# Patient Record
Sex: Male | Born: 1955 | Race: White | Hispanic: No | Smoking: Former smoker
Health system: Southern US, Community
[De-identification: ages and names within clinical notes are randomized; demographics above are authoritative.]

## PROBLEM LIST (undated history)

## (undated) DIAGNOSIS — B1920 Unspecified viral hepatitis C without hepatic coma: Secondary | ICD-10-CM

## (undated) DIAGNOSIS — L0390 Cellulitis, unspecified: Secondary | ICD-10-CM

## (undated) DIAGNOSIS — M5137 Other intervertebral disc degeneration, lumbosacral region: Secondary | ICD-10-CM

## (undated) DIAGNOSIS — M549 Dorsalgia, unspecified: Secondary | ICD-10-CM

## (undated) DIAGNOSIS — M51379 Other intervertebral disc degeneration, lumbosacral region without mention of lumbar back pain or lower extremity pain: Secondary | ICD-10-CM

## (undated) HISTORY — DX: Unspecified viral hepatitis C without hepatic coma: B19.20

## (undated) HISTORY — DX: Cellulitis, unspecified: L03.90

## (undated) HISTORY — DX: Dorsalgia, unspecified: M54.9

---

## 2011-07-03 ENCOUNTER — Encounter (HOSPITAL_COMMUNITY): Payer: Self-pay

## 2011-07-03 ENCOUNTER — Emergency Department (HOSPITAL_COMMUNITY)
Admission: EM | Admit: 2011-07-03 | Discharge: 2011-07-04 | Disposition: A | Discharge status: Home / Self Care | Payer: Self-pay | Attending: Emergency Medicine | Admitting: Emergency Medicine

## 2011-07-03 DIAGNOSIS — S0292XA Unspecified fracture of facial bones, initial encounter for closed fracture: Secondary | ICD-10-CM

## 2011-07-03 DIAGNOSIS — S0280XA Fracture of other specified skull and facial bones, unspecified side, initial encounter for closed fracture: Secondary | ICD-10-CM | POA: Insufficient documentation

## 2011-07-03 DIAGNOSIS — IMO0002 Reserved for concepts with insufficient information to code with codable children: Secondary | ICD-10-CM

## 2011-07-03 DIAGNOSIS — S02400A Malar fracture unspecified, initial encounter for closed fracture: Secondary | ICD-10-CM | POA: Insufficient documentation

## 2011-07-03 DIAGNOSIS — S02401A Maxillary fracture, unspecified, initial encounter for closed fracture: Secondary | ICD-10-CM | POA: Insufficient documentation

## 2011-07-03 DIAGNOSIS — F17200 Nicotine dependence, unspecified, uncomplicated: Secondary | ICD-10-CM | POA: Insufficient documentation

## 2011-07-03 DIAGNOSIS — S0100XA Unspecified open wound of scalp, initial encounter: Secondary | ICD-10-CM | POA: Insufficient documentation

## 2011-07-03 MED ORDER — OXYCODONE-ACETAMINOPHEN 5-325 MG PO TABS
2.0000 | ORAL_TABLET | Freq: Once | ORAL | Status: AC
  Administered 2011-07-03: 2 via ORAL
  Filled 2011-07-03: qty 2

## 2011-07-03 MED ORDER — TETANUS-DIPHTH-ACELL PERTUSSIS 5-2.5-18.5 LF-MCG/0.5 IM SUSP
0.5000 mL | Freq: Once | INTRAMUSCULAR | Status: AC
  Administered 2011-07-03: 0.5 mL via INTRAMUSCULAR
  Filled 2011-07-03: qty 0.5
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
## 2011-07-04 ENCOUNTER — Emergency Department (HOSPITAL_COMMUNITY): Payer: Self-pay

## 2011-07-04 ENCOUNTER — Encounter (HOSPITAL_COMMUNITY): Payer: Self-pay | Admitting: Radiology

## 2011-07-04 MED ORDER — IBUPROFEN 800 MG PO TABS: 800.0000 mg | ORAL_TABLET | Freq: Three times a day (TID) | ORAL | Status: AC

## 2011-07-04 MED ORDER — DOXYCYCLINE HYCLATE 100 MG PO CAPS: 100.0000 mg | ORAL_CAPSULE | Freq: Two times a day (BID) | ORAL | Status: AC

## 2011-07-04 MED ORDER — OXYCODONE-ACETAMINOPHEN 5-325 MG PO TABS: 2.0000 | ORAL_TABLET | ORAL | Status: AC | PRN
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
## 2011-07-04 NOTE — ED Notes (Signed)
Patient stating now he may just walk out--"don't want the tests'

## 2011-07-08 ENCOUNTER — Encounter (HOSPITAL_COMMUNITY): Payer: Self-pay | Admitting: Emergency Medicine

## 2016-01-07 ENCOUNTER — Encounter (HOSPITAL_COMMUNITY): Payer: Self-pay | Admitting: *Deleted

## 2016-01-07 ENCOUNTER — Emergency Department (HOSPITAL_COMMUNITY)
Admission: EM | Admit: 2016-01-07 | Discharge: 2016-01-07 | Disposition: A | Discharge status: Home / Self Care | Payer: Self-pay | Attending: Emergency Medicine | Admitting: Emergency Medicine

## 2016-01-07 DIAGNOSIS — Y939 Activity, unspecified: Secondary | ICD-10-CM | POA: Insufficient documentation

## 2016-01-07 DIAGNOSIS — S81812A Laceration without foreign body, left lower leg, initial encounter: Secondary | ICD-10-CM | POA: Insufficient documentation

## 2016-01-07 DIAGNOSIS — S81811A Laceration without foreign body, right lower leg, initial encounter: Secondary | ICD-10-CM

## 2016-01-07 DIAGNOSIS — Y990 Civilian activity done for income or pay: Secondary | ICD-10-CM | POA: Insufficient documentation

## 2016-01-07 DIAGNOSIS — Y929 Unspecified place or not applicable: Secondary | ICD-10-CM | POA: Insufficient documentation

## 2016-01-07 DIAGNOSIS — W228XXA Striking against or struck by other objects, initial encounter: Secondary | ICD-10-CM | POA: Insufficient documentation

## 2016-01-07 DIAGNOSIS — F17200 Nicotine dependence, unspecified, uncomplicated: Secondary | ICD-10-CM | POA: Insufficient documentation

## 2016-01-07 MED ORDER — TRAMADOL HCL 50 MG PO TABS: 50.0000 mg | ORAL_TABLET | Freq: Four times a day (QID) | ORAL | 0 refills | Status: DC | PRN

## 2016-01-07 MED ORDER — LIDOCAINE-EPINEPHRINE (PF) 1 %-1:200000 IJ SOLN
10.0000 mL | Freq: Once | INTRAMUSCULAR | Status: DC
  Filled 2016-01-07: qty 10

## 2016-01-07 MED ORDER — LIDOCAINE-EPINEPHRINE (PF) 2 %-1:200000 IJ SOLN
10.0000 mL | Freq: Once | INTRAMUSCULAR | Status: AC
  Administered 2016-01-07: 10 mL
  Filled 2016-01-07: qty 20

## 2016-01-07 MED ORDER — TRAMADOL HCL 50 MG PO TABS
50.0000 mg | ORAL_TABLET | Freq: Once | ORAL | Status: AC
  Administered 2016-01-07: 50 mg via ORAL
  Filled 2016-01-07: qty 1

## 2016-01-07 NOTE — ED Notes (Signed)
Declined W/C at D/C and was escorted to lobby by RN. 
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
## 2016-01-07 NOTE — ED Triage Notes (Signed)
Pt  Reports while working on a car today he hit his Rt lower leg on metal.

## 2016-01-07 NOTE — ED Triage Notes (Signed)
Dsy applied to wound byh PA after suture was place.

## 2018-04-09 ENCOUNTER — Emergency Department: Payer: Self-pay

## 2018-04-09 ENCOUNTER — Other Ambulatory Visit: Payer: Self-pay

## 2018-04-09 ENCOUNTER — Emergency Department
Admission: EM | Admit: 2018-04-09 | Discharge: 2018-04-09 | Disposition: A | Discharge status: Home / Self Care | Payer: Self-pay | Attending: Emergency Medicine | Admitting: Emergency Medicine

## 2018-04-09 ENCOUNTER — Encounter: Payer: Self-pay | Admitting: Emergency Medicine

## 2018-04-09 DIAGNOSIS — W1789XA Other fall from one level to another, initial encounter: Secondary | ICD-10-CM | POA: Insufficient documentation

## 2018-04-09 DIAGNOSIS — M25551 Pain in right hip: Secondary | ICD-10-CM

## 2018-04-09 DIAGNOSIS — Y999 Unspecified external cause status: Secondary | ICD-10-CM | POA: Insufficient documentation

## 2018-04-09 DIAGNOSIS — M533 Sacrococcygeal disorders, not elsewhere classified: Secondary | ICD-10-CM

## 2018-04-09 DIAGNOSIS — F17210 Nicotine dependence, cigarettes, uncomplicated: Secondary | ICD-10-CM | POA: Insufficient documentation

## 2018-04-09 DIAGNOSIS — Y9389 Activity, other specified: Secondary | ICD-10-CM | POA: Insufficient documentation

## 2018-04-09 DIAGNOSIS — M5441 Lumbago with sciatica, right side: Secondary | ICD-10-CM

## 2018-04-09 DIAGNOSIS — Y9289 Other specified places as the place of occurrence of the external cause: Secondary | ICD-10-CM | POA: Insufficient documentation

## 2018-04-09 DIAGNOSIS — W19XXXA Unspecified fall, initial encounter: Secondary | ICD-10-CM

## 2018-04-09 DIAGNOSIS — M25561 Pain in right knee: Secondary | ICD-10-CM

## 2018-04-09 MED ORDER — ORPHENADRINE CITRATE 30 MG/ML IJ SOLN
60.0000 mg | Freq: Two times a day (BID) | INTRAMUSCULAR | Status: DC
  Administered 2018-04-09: 60 mg via INTRAMUSCULAR
  Filled 2018-04-09: qty 2

## 2018-04-09 MED ORDER — ORPHENADRINE CITRATE ER 100 MG PO TB12: 100.0000 mg | ORAL_TABLET | Freq: Two times a day (BID) | ORAL | 0 refills | Status: DC

## 2018-04-09 MED ORDER — KETOROLAC TROMETHAMINE 60 MG/2ML IM SOLN
60.0000 mg | Freq: Once | INTRAMUSCULAR | Status: AC
  Administered 2018-04-09: 60 mg via INTRAMUSCULAR
  Filled 2018-04-09: qty 2

## 2018-04-09 MED ORDER — HYDROCODONE-ACETAMINOPHEN 5-325 MG PO TABS
1.0000 | ORAL_TABLET | Freq: Once | ORAL | Status: AC
  Administered 2018-04-09: 1 via ORAL
  Filled 2018-04-09: qty 1

## 2018-04-09 MED ORDER — HYDROCODONE-ACETAMINOPHEN 7.5-325 MG PO TABS: 1.0000 | ORAL_TABLET | Freq: Three times a day (TID) | ORAL | 0 refills | Status: DC | PRN

## 2018-04-09 NOTE — ED Provider Notes (Signed)
[facility] Emergency Department Provider Note ____________________________________________  Time seen: 1245  I have reviewed the triage vital signs and the nursing notes.  HISTORY  Chief Complaint  Fall   HPI Corey James is a 62 y.o. male presents to the ER today with complaint of low back pain, pain in his tailbone, right hip pain and right knee pain.  He reports yesterday he was working on a roof when he fell off.  He reports he landed on his back.  He denies any bruising, swelling or abrasions.  He describes the pain is sore, aching and throbbing.  The pain radiates into his right buttock and down to his right knee.  He denies numbness, tingling or weakness.  He denies issues with bowel or bladder incontinence.  He reports he has had a previous back injury and had to be in a brace for a few months.  He is never had back surgery.  He has taken Aleve with minimal relief.  History reviewed. No pertinent past medical history.  There are no active problems to display for this patient.   Past Surgical History:  Procedure Laterality Date  . BACK SURGERY      Prior to Admission medications   Medication Sig Start Date End Date Taking? Authorizing Provider  HYDROcodone-acetaminophen (NORCO) 7.5-325 MG tablet Take 1 tablet by mouth every 8 (eight) hours as needed for moderate pain. 04/09/18   Jearld Fenton, NP  orphenadrine (NORFLEX) 100 MG tablet Take 1 tablet (100 mg total) by mouth 2 (two) times daily. 04/09/18 04/09/19  Jearld Fenton, NP    Allergies Penicillins  No family history on file.  Social History Social History   Tobacco Use  . Smoking status: Current Every Day Smoker    Packs/day: 0.75    Types: Cigarettes  . Smokeless tobacco: Current User  Substance Use Topics  . Alcohol use: Yes  . Drug use: Yes    Review of Systems  Constitutional: Negative for fever. Cardiovascular: Negative for chest pain. Respiratory: Negative for  shortness of breath. Gastrointestinal: Negative for abdominal pain, nausea, vomiting, constipation and diarrhea. Genitourinary: Negative for urgency, frequency or dysuria. Musculoskeletal: Positive for low back pain, right leg pain and right knee pain.   Skin: Negative for bruising, swelling or abrasions. Neurological: Negative for tingling, focal weakness or numbness. ____________________________________________  PHYSICAL EXAM:  VITAL SIGNS: ED Triage Vitals [04/09/18 1132]  Enc Vitals Group     BP 112/80     Pulse Rate 74     Resp 18     Temp (!) 97.5 F (36.4 C)     Temp Source Oral     SpO2 100 %     Weight (!) 310 lb (140.6 kg)     Height 5\' 9"  (1.753 m)     Head Circumference      Peak Flow      Pain Score 9     Pain Loc      Pain Edu?      Excl. in [place]?     Constitutional: Alert and oriented.  Obese, in no distress. Cardiovascular: Normal rate, regular rhythm.  Pedal pulses 2+ bilaterally Respiratory: Normal respiratory effort. No wheezes/rales/rhonchi. Musculoskeletal: Decreased flexion, extension and rotation of the spine secondary to pain.  Bony tenderness noted over the sacrum.  Normal abduction and abduction of the right hip.  Decreased internal and external rotation secondary to pain.  Normal flexion and extension of the right knee.  No joint  swelling noted.  Pain with palpation over the patellar tibial tendon.  Gait not visualized. Neurologic:  Normal speech and language. No gross focal neurologic deficits are appreciated.  Positive SLR on the right.   Skin:  Skin is warm, dry and intact. No abrasions or bruising noted. ____________________________________________   RADIOLOGY   Imaging Orders     DG Lumbar Spine Complete     DG Sacrum/Coccyx     DG Knee Complete 4 Views Right     DG Hip Unilat W or Wo Pelvis 2-3 Views Right     CT Hip Right Wo Contrast  IMPRESSION: Lucency within the posterior aspect of the acetabulum on the right. Although this may be  artifactual in nature the possibility of an avulsion from the posterior wall of the acetabulum could not be totally excluded. Cross-sectional imaging would be helpful for further evaluation. No other findings to suggest fracture are seen.  IMPRESSION: Degenerative change without acute abnormality.  IMPRESSION: No acute abnormality noted  IMPRESSION: Chronic appearing compression deformities at L1 and L4. Multilevel degenerative changes are seen  IMPRESSION: 1. No acute osseous abnormality. Lucency seen on x-ray along the posterior acetabulum corresponds to a large osteophyte. 2. Moderate right hip osteoarthritis. ___________________________________________   INITIAL IMPRESSION / ASSESSMENT AND PLAN / ED COURSE  Acute Low Back Pain, Sacral Pain, Right Hip Pain, Right Knee Pain s/p Fall:  Xray lumbar spine noted with compression deformity at L1, L4, non acute Xray sacrum negative Xray right knee degenerative changes without acute abnormality Xray right hip shows lucency within the acetabulam, request further eval with CT CT right hip CT hip negative for acute findings Hydrocodone-Acetaminophen 5-325 mg PO x 1 in ER 60 mg Toradol IM today 60 mg Norflex IM today RX for Hydrocodone 7.5-325 mg TID prn RX for Norflex 100 mg BID- sedation caution given  [place]SRS reviewed. No recent controlled substances filled _______________________________________  FINAL CLINICAL IMPRESSION(S) / ED DIAGNOSES  Final diagnoses:  Fall, initial encounter  Acute right-sided low back pain with right-sided sciatica  Right hip pain  Acute pain of right knee  Sacral pain      Jearld Fenton, NP 04/09/18 1554    Lisa Roca, MD 04/09/18 1606

## 2018-04-09 NOTE — ED Triage Notes (Signed)
Pt arrived via POV, pt is ambulatory in lobby without difficulty, pt states he was working on a shed yesterday and fell off the roof, pt states he landed on his feet and then his legs buckled and fell backward onto his back.  Pt c/o low back pain around the tail bone and the right leg. Pt c/o the worse pain in his right leg. Pt walking with a limp.  Denies any numbness or tingling.

## 2018-04-09 NOTE — ED Notes (Signed)
See triage note  States he fell from roof yesterday  Landed on both feet  Now having lower back pain and right leg pain  Ambulates well to room

## 2018-04-09 NOTE — Discharge Instructions (Addendum)
You were seen for acute right-sided low back pain, right hip pain and right knee pain status post fall.  The x-ray of the lumbar line showed chronic compression deformity at L1 and L4.  X-ray of your tailbone was negative for fracture. x-ray of your right knee shows arthritic changes but no acute findings.  CT scan of the right hip does not show any evidence of fracture.  I have given you a prescription for pain medicine and muscle relaxers to take as prescribed.  Follow-up as needed if symptoms persist or worsen.

## 2018-05-05 ENCOUNTER — Encounter: Payer: Self-pay | Admitting: Emergency Medicine

## 2018-05-05 ENCOUNTER — Other Ambulatory Visit: Payer: Self-pay

## 2018-05-05 ENCOUNTER — Emergency Department
Admission: EM | Admit: 2018-05-05 | Discharge: 2018-05-05 | Disposition: A | Discharge status: Home / Self Care | Payer: Self-pay | Attending: Emergency Medicine | Admitting: Emergency Medicine

## 2018-05-05 DIAGNOSIS — Z79899 Other long term (current) drug therapy: Secondary | ICD-10-CM | POA: Insufficient documentation

## 2018-05-05 DIAGNOSIS — M5431 Sciatica, right side: Secondary | ICD-10-CM | POA: Insufficient documentation

## 2018-05-05 DIAGNOSIS — F17210 Nicotine dependence, cigarettes, uncomplicated: Secondary | ICD-10-CM | POA: Insufficient documentation

## 2018-05-05 MED ORDER — NAPROXEN 500 MG PO TABS: 500.0000 mg | ORAL_TABLET | Freq: Two times a day (BID) | ORAL | 0 refills | Status: DC

## 2018-05-05 MED ORDER — CYCLOBENZAPRINE HCL 5 MG PO TABS: ORAL_TABLET | ORAL | 0 refills | Status: DC

## 2018-05-05 MED ORDER — LIDOCAINE 5 % EX PTCH
1.0000 | MEDICATED_PATCH | CUTANEOUS | Status: DC
  Administered 2018-05-05: 1 via TRANSDERMAL
  Filled 2018-05-05: qty 1

## 2018-05-05 MED ORDER — OXYCODONE-ACETAMINOPHEN 5-325 MG PO TABS
1.0000 | ORAL_TABLET | Freq: Once | ORAL | Status: AC
  Administered 2018-05-05: 1 via ORAL
  Filled 2018-05-05: qty 1

## 2018-05-05 MED ORDER — MELOXICAM 15 MG PO TABS: 15.0000 mg | ORAL_TABLET | Freq: Every day | ORAL | 0 refills | Status: AC

## 2018-05-05 MED ORDER — METHYLPREDNISOLONE SODIUM SUCC 125 MG IJ SOLR
125.0000 mg | Freq: Once | INTRAMUSCULAR | Status: AC
  Administered 2018-05-05: 125 mg via INTRAMUSCULAR
  Filled 2018-05-05: qty 2

## 2018-05-05 MED ORDER — PREDNISONE 10 MG PO TABS: ORAL_TABLET | ORAL | 0 refills | Status: DC

## 2018-05-05 MED ORDER — OXYCODONE-ACETAMINOPHEN 5-325 MG PO TABS: 1.0000 | ORAL_TABLET | ORAL | 0 refills | Status: DC | PRN

## 2018-05-05 NOTE — ED Triage Notes (Signed)
First Nurse Note:  Arrives with c/o persistent right leg pain.  Patient seen through ED 2 weeks ago for same.  Had an extensive ED workup.  States pain persists.   AAOx3.  Skin warm and dry. NAD

## 2018-05-05 NOTE — ED Triage Notes (Signed)
Pt arrived via POV with reports of right leg pain, has hx of the same a few weeks ago, states the pain feels the same,  Pt states the pain is in the right buttocks and shoots down the right leg. Pt is ambulatory in triage without difficulty.

## 2018-05-05 NOTE — ED Provider Notes (Signed)
[facility] Emergency Department Provider Note  ____________________________________________  Time seen: Approximately 9:10 AM  I have reviewed the triage vital signs and the nursing notes.   HISTORY  Chief Complaint Leg Pain    HPI Corey James is a 62 y.o. male presents emergency department for evaluation of low back pain that radiates into her right leg for 2 weeks.  Patient states that he was evaluated in the emergency department for a fall 2 weeks ago.  He has been taking medications as prescribed but pain seems to be getting worse.  Pain starts in his low right back and shoots down his right leg.  Pain shoots down the back in the side of his right leg.  He has been walking with a limp due to pain.  No bowel or bladder dysfunction or saddle anesthesias.  No numbness, tingling, calf pain, leg swelling.  History reviewed. No pertinent past medical history.  There are no active problems to display for this patient.   Past Surgical History:  Procedure Laterality Date  . BACK SURGERY      Prior to Admission medications   Medication Sig Start Date End Date Taking? Authorizing Provider  cyclobenzaprine (FLEXERIL) 5 MG tablet Take 1-2 tablets 3 times daily as needed 05/05/18   Laban Emperor, PA-C  HYDROcodone-acetaminophen (NORCO) 7.5-325 MG tablet Take 1 tablet by mouth every 8 (eight) hours as needed for moderate pain. 04/09/18   Jearld Fenton, NP  meloxicam (MOBIC) 15 MG tablet Take 1 tablet (15 mg total) by mouth daily for 10 days. 05/05/18 05/15/18  Laban Emperor, PA-C  naproxen (NAPROSYN) 500 MG tablet Take 1 tablet (500 mg total) by mouth 2 (two) times daily with a meal. 05/05/18 05/05/19  Laban Emperor, PA-C  orphenadrine (NORFLEX) 100 MG tablet Take 1 tablet (100 mg total) by mouth 2 (two) times daily. 04/09/18 04/09/19  Jearld Fenton, NP  oxyCODONE-acetaminophen (PERCOCET) 5-325 MG tablet Take 1 tablet by mouth every 4 (four) hours as needed for  severe pain. 05/05/18 05/05/19  Laban Emperor, PA-C  predniSONE (DELTASONE) 10 MG tablet Take 6 tablets day 1, take 5 tablets day 2, take 4 tablets day 3, take 3 tablets day 4, take 2 tablets day 5, take 1 tablet day 6 05/05/18   Laban Emperor, PA-C    Allergies Penicillins  History reviewed. No pertinent family history.  Social History Social History   Tobacco Use  . Smoking status: Current Every Day Smoker    Packs/day: 0.75    Types: Cigarettes  . Smokeless tobacco: Current User  Substance Use Topics  . Alcohol use: Yes  . Drug use: Yes     Review of Systems  Constitutional: No fever/chills Respiratory: No SOB. Gastrointestinal: No abdominal pain.  No nausea, no vomiting.  Musculoskeletal: Positive for back pain. Skin: Negative for rash, abrasions, lacerations, ecchymosis. Neurological: Negative for headaches, numbness or tingling   ____________________________________________   PHYSICAL EXAM:  VITAL SIGNS: ED Triage Vitals  Enc Vitals Group     BP 05/05/18 0836 (!) 156/81     Pulse Rate 05/05/18 0836 82     Resp 05/05/18 0836 18     Temp 05/05/18 0836 97.6 F (36.4 C)     Temp Source 05/05/18 0836 Oral     SpO2 05/05/18 0836 98 %     Weight 05/05/18 0835 (!) 310 lb (140.6 kg)     Height 05/05/18 0835 5\' 9"  (1.753 m)     Head Circumference --  Peak Flow --      Pain Score 05/05/18 0835 8     Pain Loc --      Pain Edu? --      Excl. in [place]? --      Constitutional: Alert and oriented. Well appearing and in no acute distress. Eyes: Conjunctivae are normal. PERRL. EOMI. Head: Atraumatic. ENT:      Ears:      Nose: No congestion/rhinnorhea.      Mouth/Throat: Mucous membranes are moist.  Neck: No stridor.  Cardiovascular: Normal rate, regular rhythm.  Good peripheral circulation. Respiratory: Normal respiratory effort without tachypnea or retractions. Lungs CTAB. Good air entry to the bases with no decreased or absent breath  sounds. Gastrointestinal: Bowel sounds 4 quadrants. Soft and nontender to palpation. No guarding or rigidity. No palpable masses. No distention.  Musculoskeletal: Full range of motion to all extremities. No gross deformities appreciated.  No tenderness to palpation over lumbar spine.  Tenderness palpation over right SI joint.  Full range of motion of right hip, right knee, right ankle.  Negative straight leg raise. Neurologic:  Normal speech and language. No gross focal neurologic deficits are appreciated.  Skin:  Skin is warm, dry and intact. No rash noted. Psychiatric: Mood and affect are normal. Speech and behavior are normal. Patient exhibits appropriate insight and judgement.   ____________________________________________   LABS (all labs ordered are listed, but only abnormal results are displayed)  Labs Reviewed - No data to display ____________________________________________  EKG   ____________________________________________  RADIOLOGY   No results found.  ____________________________________________    PROCEDURES  Procedure(s) performed:    Procedures    Medications  methylPREDNISolone sodium succinate (SOLU-MEDROL) 125 mg/2 mL injection 125 mg (125 mg Intramuscular Given 05/05/18 0925)  oxyCODONE-acetaminophen (PERCOCET/ROXICET) 5-325 MG per tablet 1 tablet (1 tablet Oral Given 05/05/18 0923)     ____________________________________________   INITIAL IMPRESSION / ASSESSMENT AND PLAN / ED COURSE  Pertinent labs & imaging results that were available during my care of the patient were reviewed by me and considered in my medical decision making (see chart for details).  Review of the [place] CSRS was performed in accordance of the [place] prior to dispensing any controlled drugs.     Patient's diagnosis is consistent with sciatica.  Vital signs and exam are reassuring.  IM Solu-Medrol and oral Percocet were given.  Patient will be discharged home with  prescriptions for prednisone, Flexeril, naproxen, a short course of percocet. Patient would like something other than naprosyn and was sent in mobic instead. Patient is to follow up with primary care as directed.  Primary care resources were provided.  Patient is given ED precautions to return to the ED for any worsening or new symptoms.     ____________________________________________  FINAL CLINICAL IMPRESSION(S) / ED DIAGNOSES  Final diagnoses:  Sciatica of right side      NEW MEDICATIONS STARTED DURING THIS VISIT:  ED Discharge Orders         Ordered    predniSONE (DELTASONE) 10 MG tablet     05/05/18 1002    cyclobenzaprine (FLEXERIL) 5 MG tablet     05/05/18 1002    naproxen (NAPROSYN) 500 MG tablet  2 times daily with meals     05/05/18 1002    meloxicam (MOBIC) 15 MG tablet  Daily     05/05/18 1039    oxyCODONE-acetaminophen (PERCOCET) 5-325 MG tablet  Every 4 hours PRN     05/05/18 1045  This chart was dictated using voice recognition software/Dragon. Despite best efforts to proofread, errors can occur which can change the meaning. Any change was purely unintentional.    Laban Emperor, PA-C 05/05/18 [place], MD 05/05/18 [phone]

## 2018-06-09 ENCOUNTER — Emergency Department
Admission: EM | Admit: 2018-06-09 | Discharge: 2018-06-09 | Disposition: A | Discharge status: Home / Self Care | Payer: Medicaid Other | Attending: Emergency Medicine | Admitting: Emergency Medicine

## 2018-06-09 DIAGNOSIS — M25551 Pain in right hip: Secondary | ICD-10-CM | POA: Diagnosis present

## 2018-06-09 DIAGNOSIS — Z6841 Body Mass Index (BMI) 40.0 and over, adult: Secondary | ICD-10-CM

## 2018-06-09 DIAGNOSIS — M5136 Other intervertebral disc degeneration, lumbar region: Secondary | ICD-10-CM | POA: Insufficient documentation

## 2018-06-09 DIAGNOSIS — M1610 Unilateral primary osteoarthritis, unspecified hip: Secondary | ICD-10-CM | POA: Insufficient documentation

## 2018-06-09 DIAGNOSIS — F17210 Nicotine dependence, cigarettes, uncomplicated: Secondary | ICD-10-CM | POA: Insufficient documentation

## 2018-06-09 DIAGNOSIS — G8929 Other chronic pain: Secondary | ICD-10-CM | POA: Insufficient documentation

## 2018-06-09 HISTORY — DX: Other intervertebral disc degeneration, lumbosacral region without mention of lumbar back pain or lower extremity pain: M51.379

## 2018-06-09 HISTORY — DX: Other intervertebral disc degeneration, lumbosacral region: M51.37

## 2018-06-09 MED ORDER — HYDROCODONE-ACETAMINOPHEN 5-325 MG PO TABS: 1.0000 | ORAL_TABLET | Freq: Four times a day (QID) | ORAL | 0 refills | Status: DC | PRN

## 2018-06-09 MED ORDER — HYDROMORPHONE HCL 1 MG/ML IJ SOLN
2.0000 mg | Freq: Once | INTRAMUSCULAR | Status: AC
  Administered 2018-06-09: 2 mg via INTRAMUSCULAR
  Filled 2018-06-09: qty 2

## 2018-06-09 NOTE — ED Provider Notes (Signed)
[facility] Emergency Department Provider Note  ____________________________________________   First MD Initiated Contact with Patient 06/09/18 0602     (approximate)  I have reviewed the triage vital signs and the nursing notes.   HISTORY  Chief Complaint Hip Pain   HPI Corey James is a 63 y.o. male comes to the emergency department with 3 months of right hip pain.  Symptoms began when he fell 3 months ago landing on the bottom of his feet and falling onto his buttocks.  He was initially seen in our emergency department and he had an x-ray of his right hip showing a possible injury to his acetabulum and he subsequently had a CT scan of his hip showing an osteophyte and arthritic changes.  The patient has not followed up with primary care and does not know how to find an orthopedic surgeon and has been having pain persistently for the past 3 months.  He presents tonight with an acute exacerbation of his chronic right hip pain.  Pain is throbbing aching in his right hip radiating towards his right groin.  He is able to ambulate although pain is worse when walking.  No fevers or chills.  Pain is worse when lying on his right hip.  No numbness or weakness.  He had difficulty sleeping tonight which prompted the visit.    Past Medical History:  Diagnosis Date  . Disc degeneration, lumbosacral     There are no active problems to display for this patient.   History reviewed. No pertinent surgical history.  Prior to Admission medications   Medication Sig Start Date End Date Taking? Authorizing Provider  cyclobenzaprine (FLEXERIL) 5 MG tablet Take 1-2 tablets 3 times daily as needed 05/05/18   Laban Emperor, PA-C  HYDROcodone-acetaminophen (NORCO) 5-325 MG tablet Take 1 tablet by mouth every 6 (six) hours as needed for up to 15 doses for severe pain. 06/09/18   Darel Hong, MD  naproxen (NAPROSYN) 500 MG tablet Take 1 tablet (500 mg total) by mouth 2 (two)  times daily with a meal. 05/05/18 05/05/19  Laban Emperor, PA-C  orphenadrine (NORFLEX) 100 MG tablet Take 1 tablet (100 mg total) by mouth 2 (two) times daily. 04/09/18 04/09/19  Jearld Fenton, NP  oxyCODONE-acetaminophen (PERCOCET) 5-325 MG tablet Take 1 tablet by mouth every 4 (four) hours as needed for severe pain. 05/05/18 05/05/19  Laban Emperor, PA-C  predniSONE (DELTASONE) 10 MG tablet Take 6 tablets day 1, take 5 tablets day 2, take 4 tablets day 3, take 3 tablets day 4, take 2 tablets day 5, take 1 tablet day 6 05/05/18   Laban Emperor, PA-C    Allergies Penicillins  No family history on file.  Social History Social History   Tobacco Use  . Smoking status: Current Every Day Smoker    Packs/day: 0.75    Types: Cigarettes  . Smokeless tobacco: Current User  Substance Use Topics  . Alcohol use: Yes  . Drug use: Yes    Review of Systems Constitutional: No fever/chills Cardiovascular: Denies chest pain. Respiratory: Denies shortness of breath. Gastrointestinal: No abdominal pain.  No nausea, no vomiting.   Musculoskeletal: Positive for hip pain Neurological: Negative for numbness or weakness   ____________________________________________   PHYSICAL EXAM:  VITAL SIGNS: ED Triage Vitals  Enc Vitals Group     BP 06/09/18 0535 (!) 149/76     Pulse Rate 06/09/18 0535 84     Resp 06/09/18 0535 18  Temp 06/09/18 0535 97.8 F (36.6 C)     Temp Source 06/09/18 0535 Oral     SpO2 06/09/18 0535 98 %     Weight 06/09/18 0536 (!) 310 lb 13.6 oz (141 kg)     Height --      Head Circumference --      Peak Flow --      Pain Score 06/09/18 0536 10     Pain Loc --      Pain Edu? --      Excl. in [place]? --     Constitutional: Alert and oriented x4 appears obviously uncomfortable although nontoxic in no diaphoresis Head: Atraumatic. Nose: No congestion/rhinnorhea. Mouth/Throat: No trismus Neck: No stridor.   Cardiovascular: Regular rate and rhythm Respiratory:  Normal respiratory effort.  No retractions. MSK: Legs are equal in length.  Neurovascularly intact.  Discomfort with internal or external rotation of the right hip.  No discomfort with logroll Neurologic:  Normal speech and language. No gross focal neurologic deficits are appreciated.  Skin:  Skin is warm, dry and intact. No rash noted.    ____________________________________________  LABS (all labs ordered are listed, but only abnormal results are displayed)  Labs Reviewed - No data to display   __________________________________________  EKG   ____________________________________________  RADIOLOGY  Previous x-ray and CT scans of the hip reviewed by me show osteoarthritis and and an osteophyte ____________________________________________   DIFFERENTIAL includes but not limited to  Hip arthritis, occult hip fracture, acetabular injury, cartilaginous injury, tendinous injury   PROCEDURES  Procedure(s) performed: no  Procedures  Critical Care performed: no  ____________________________________________   INITIAL IMPRESSION / ASSESSMENT AND PLAN / ED COURSE  Pertinent labs & imaging results that were available during my care of the patient were reviewed by me and considered in my medical decision making (see chart for details).   As part of my medical decision making, I reviewed the following data within the [place] History obtained from family if available, nursing notes, old chart and ekg, as well as notes from prior ED visits.  The patient comes to the emergency department with 3 months of chronic hip pain.  I did review previous imaging which had some concern for an avulsion to the acetabulum.  CT scan showed significant osteoarthritis along with an osteophyte.  I discussed with the patient the possibility of a soft tissue injury that could only be detected on MRI however as he is neurovascularly intact and the symptoms have been going on for 3  months I do not think he requires an emergent MRI.  He was never referred to orthopedic surgery so I will make that referral for him today.  Given 2 mg of intramuscular Dilaudid with improvement in his symptoms and I will discharge him home with a short course of hydrocodone to bridge the gap until he can see orthopedic surgery.  Strict return precautions been given.      ____________________________________________   FINAL CLINICAL IMPRESSION(S) / ED DIAGNOSES  Final diagnoses:  Chronic pain of right hip      NEW MEDICATIONS STARTED DURING THIS VISIT:  Discharge Medication List as of 06/09/2018  6:11 AM    START taking these medications   Details  HYDROcodone-acetaminophen (NORCO) 5-325 MG tablet Take 1 tablet by mouth every 6 (six) hours as needed for up to 15 doses for severe pain., Starting Fri 06/09/2018, Print         Note:  This document was prepared  using Systems analyst and may include unintentional dictation errors.     Darel Hong, MD 06/11/18 2208

## 2018-06-09 NOTE — Discharge Instructions (Signed)
Please take your pain medication as needed for severe symptoms and make an appointment to follow-up with the orthopedic surgeon within 1 week for recheck.  Return to the emergency department sooner for any concerns.  It was a pleasure to take care of you today, and thank you for coming to our emergency department.  If you have any questions or concerns before leaving please ask the nurse to grab me and I'm more than happy to go through your aftercare instructions again.  If you were prescribed any opioid pain medication today such as Norco, Vicodin, Percocet, morphine, hydrocodone, or oxycodone please make sure you do not drive when you are taking this medication as it can alter your ability to drive safely.  If you have any concerns once you are home that you are not improving or are in fact getting worse before you can make it to your follow-up appointment, please do not hesitate to call 911 and come back for further evaluation.  Darel Hong, MD

## 2018-06-09 NOTE — ED Triage Notes (Signed)
Patient c/o right hip pain. Patient reports falling off a building 3 months ago, and reports continued pain since. Patient reports being seen in this ED multiple times for same.

## 2018-07-01 ENCOUNTER — Emergency Department
Admission: EM | Admit: 2018-07-01 | Discharge: 2018-07-01 | Disposition: A | Discharge status: Home / Self Care | Payer: Medicaid Other | Attending: Emergency Medicine | Admitting: Emergency Medicine

## 2018-07-01 ENCOUNTER — Encounter: Payer: Self-pay | Admitting: Emergency Medicine

## 2018-07-01 ENCOUNTER — Other Ambulatory Visit: Payer: Self-pay

## 2018-07-01 DIAGNOSIS — L03116 Cellulitis of left lower limb: Secondary | ICD-10-CM | POA: Insufficient documentation

## 2018-07-01 DIAGNOSIS — F17210 Nicotine dependence, cigarettes, uncomplicated: Secondary | ICD-10-CM | POA: Insufficient documentation

## 2018-07-01 DIAGNOSIS — M791 Myalgia, unspecified site: Secondary | ICD-10-CM | POA: Insufficient documentation

## 2018-07-01 DIAGNOSIS — L02416 Cutaneous abscess of left lower limb: Secondary | ICD-10-CM

## 2018-07-01 DIAGNOSIS — R739 Hyperglycemia, unspecified: Secondary | ICD-10-CM | POA: Insufficient documentation

## 2018-07-01 DIAGNOSIS — R7309 Other abnormal glucose: Secondary | ICD-10-CM

## 2018-07-01 DIAGNOSIS — R509 Fever, unspecified: Secondary | ICD-10-CM | POA: Diagnosis present

## 2018-07-01 LAB — CBC WITH DIFFERENTIAL/PLATELET
ABS IMMATURE GRANULOCYTES: 0.71 10*3/uL — AB (ref 0.00–0.07)
Basophils Absolute: 0.1 10*3/uL (ref 0.0–0.1)
Basophils Relative: 0 %
Eosinophils Absolute: 0 10*3/uL (ref 0.0–0.5)
Eosinophils Relative: 0 %
HCT: 42.4 % (ref 39.0–52.0)
Hemoglobin: 14.6 g/dL (ref 13.0–17.0)
Immature Granulocytes: 4 %
Lymphocytes Relative: 4 %
Lymphs Abs: 0.8 10*3/uL (ref 0.7–4.0)
MCH: 31.8 pg (ref 26.0–34.0)
MCHC: 34.4 g/dL (ref 30.0–36.0)
MCV: 92.4 fL (ref 80.0–100.0)
MONO ABS: 1.2 10*3/uL — AB (ref 0.1–1.0)
Monocytes Relative: 6 %
Neutro Abs: 17.8 10*3/uL — ABNORMAL HIGH (ref 1.7–7.7)
Neutrophils Relative %: 86 %
Platelets: 140 10*3/uL — ABNORMAL LOW (ref 150–400)
RBC: 4.59 MIL/uL (ref 4.22–5.81)
RDW: 12.4 % (ref 11.5–15.5)
WBC: 20.5 10*3/uL — ABNORMAL HIGH (ref 4.0–10.5)
nRBC: 0 % (ref 0.0–0.2)

## 2018-07-01 LAB — COMPREHENSIVE METABOLIC PANEL
ALT: 27 U/L (ref 0–44)
AST: 21 U/L (ref 15–41)
Albumin: 3.3 g/dL — ABNORMAL LOW (ref 3.5–5.0)
Alkaline Phosphatase: 61 U/L (ref 38–126)
Anion gap: 9 (ref 5–15)
BUN: 17 mg/dL (ref 8–23)
CO2: 25 mmol/L (ref 22–32)
Calcium: 8.3 mg/dL — ABNORMAL LOW (ref 8.9–10.3)
Chloride: 100 mmol/L (ref 98–111)
Creatinine, Ser: 0.9 mg/dL (ref 0.61–1.24)
GFR calc Af Amer: >60 mL/min (ref >60)
GFR calc non Af Amer: >60 mL/min (ref >60)
Glucose, Bld: 211 mg/dL — ABNORMAL HIGH (ref 70–99)
Potassium: 3.7 mmol/L (ref 3.5–5.1)
Sodium: 134 mmol/L — ABNORMAL LOW (ref 135–145)
Total Bilirubin: 2.1 mg/dL — ABNORMAL HIGH (ref 0.3–1.2)
Total Protein: 7.3 g/dL (ref 6.5–8.1)

## 2018-07-01 MED ORDER — CLINDAMYCIN PHOSPHATE 600 MG/50ML IV SOLN
600.0000 mg | Freq: Once | INTRAVENOUS | Status: AC
  Administered 2018-07-01: 600 mg via INTRAVENOUS
  Filled 2018-07-01: qty 50

## 2018-07-01 MED ORDER — SULFAMETHOXAZOLE-TRIMETHOPRIM 800-160 MG PO TABS: 1.0000 | ORAL_TABLET | Freq: Two times a day (BID) | ORAL | 0 refills | Status: DC

## 2018-07-01 MED ORDER — OXYCODONE-ACETAMINOPHEN 5-325 MG PO TABS: 1.0000 | ORAL_TABLET | Freq: Four times a day (QID) | ORAL | 0 refills | Status: DC | PRN

## 2018-07-01 MED ORDER — SODIUM CHLORIDE 0.9 % IV BOLUS
500.0000 mL | Freq: Once | INTRAVENOUS | Status: AC
  Administered 2018-07-01: 500 mL via INTRAVENOUS

## 2018-07-01 NOTE — Discharge Instructions (Signed)
Return to the emergency department in 2 days for recheck of your left leg.  Return sooner if there is any severe worsening of your symptoms such as high fever, chills, nausea or vomiting.  Begin taking antibiotics as directed and pain medication only as prescribed.  Also read information about hyperglycemia.  Your blood sugar was elevated while in the ED.  You will need to have this rechecked by your primary care provider nonfasting to see if you are diabetic or if it is actually the infection that is making your blood sugar elevate.

## 2018-07-01 NOTE — ED Triage Notes (Signed)
Fever and generalized body aches x 3 days.

## 2018-07-01 NOTE — ED Provider Notes (Signed)
[facility] Emergency Department Provider Note   ____________________________________________   First MD Initiated Contact with Patient 07/01/18 1111     (approximate)  I have reviewed the triage vital signs and the nursing notes.   HISTORY  Chief Complaint Fever and Generalized Body Aches   HPI Corey James is a 63 y.o. male   patient presents to the ED with complaint of fever and generalized body aches for the last 3 days.  He is unaware of any exposure to the flu.  He denies any vomiting or diarrhea.  He also is concerned because he has a red area on his left thigh that he also wants looked at.  Fever at home has been subjective and no other family members are sick at this time.   Past Medical History:  Diagnosis Date  . Disc degeneration, lumbosacral     There are no active problems to display for this patient.   History reviewed. No pertinent surgical history.  Prior to Admission medications   Medication Sig Start Date End Date Taking? Authorizing Provider  oxyCODONE-acetaminophen (PERCOCET) 5-325 MG tablet Take 1 tablet by mouth every 6 (six) hours as needed for severe pain. 07/01/18 07/01/19  Johnn Hai, PA-C  sulfamethoxazole-trimethoprim (BACTRIM DS,SEPTRA DS) 800-160 MG tablet Take 1 tablet by mouth 2 (two) times daily. 07/01/18   Johnn Hai, PA-C    Allergies Penicillins  No family history on file.  Social History Social History   Tobacco Use  . Smoking status: Current Every Day Smoker    Packs/day: 0.75    Types: Cigarettes  . Smokeless tobacco: Current User  Substance Use Topics  . Alcohol use: Yes  . Drug use: Yes    Review of Systems Constitutional: Positive subjective fever/chills Eyes: No visual changes. ENT: No sore throat.  Positive for nasal congestion. Cardiovascular: Denies chest pain. Respiratory: Denies shortness of breath. Gastrointestinal: No abdominal pain.  No nausea, no vomiting.  No diarrhea.    Musculoskeletal: Positive for muscle aches. Skin: Positive for redness left thigh. Neurological: Negative for headaches, focal weakness or numbness. ____________________________________________   PHYSICAL EXAM:  VITAL SIGNS: ED Triage Vitals  Enc Vitals Group     BP 07/01/18 0925 (!) 172/78     Pulse Rate 07/01/18 0925 (!) 102     Resp 07/01/18 0925 20     Temp 07/01/18 0925 97.6 F (36.4 C)     Temp Source 07/01/18 0925 Oral     SpO2 07/01/18 0925 99 %     Weight 07/01/18 0926 (!) 330 lb (149.7 kg)     Height 07/01/18 0926 5\' 9"  (1.753 m)     Head Circumference --      Peak Flow --      Pain Score 07/01/18 0926 8     Pain Loc --      Pain Edu? --      Excl. in [place]? --    Constitutional: Alert and oriented. Well appearing and in no acute distress. Eyes: Conjunctivae are normal. Head: Atraumatic. Nose: Mild to moderate congestion/rhinnorhea. Mouth/Throat: Mucous membranes are moist.  Oropharynx non-erythematous. Neck: No stridor.   Cardiovascular: Normal rate, regular rhythm. Grossly normal heart sounds.  Good peripheral circulation. Respiratory: Normal respiratory effort.  No retractions. Lungs CTAB. Gastrointestinal: Soft and nontender. No distention. Musculoskeletal: Moves upper and lower extremities.  No joint effusion or swelling. Neurologic:  Normal speech and language. No gross focal neurologic deficits are appreciated.  Skin:  Skin is  warm, dry and intact.  Large erythematous area on the anterior portion of the left upper thigh that is warm and tender to palpation.  No localized cystic formation noted on palpation.  No drainage from the area and no evidence of injury to the skin. Psychiatric: Mood and affect are normal. Speech and behavior are normal.  ____________________________________________   LABS (all labs ordered are listed, but only abnormal results are displayed)  Labs Reviewed  CBC WITH DIFFERENTIAL/PLATELET - Abnormal; Notable for the following  components:      Result Value   WBC 20.5 (*)    Platelets 140 (*)    Neutro Abs 17.8 (*)    Monocytes Absolute 1.2 (*)    Abs Immature Granulocytes 0.71 (*)    All other components within normal limits  COMPREHENSIVE METABOLIC PANEL - Abnormal; Notable for the following components:   Sodium 134 (*)    Glucose, Bld 211 (*)    Calcium 8.3 (*)    Albumin 3.3 (*)    Total Bilirubin 2.1 (*)    All other components within normal limits     PROCEDURES  Procedure(s) performed: None  Procedures  Critical Care performed: No  ____________________________________________   INITIAL IMPRESSION / ASSESSMENT AND PLAN / ED COURSE  As part of my medical decision making, I reviewed the following data within the electronic MEDICAL RECORD NUMBER Notes from prior ED visits and [place] Controlled Substance Database  Patient presents to the ED with flulike symptoms however on physical exam it was discovered that his left anterior thigh was cellulitic.  Patient lab work showed elevated glucose nonfasting at 211.  Is unclear whether the infection is causing his elevated blood sugar, the food that he ate or that he is truly diabetic and this is the cause of the infection.  Patient was given strong instructions to follow-up with a PCP of his choice for recheck of his glucose.  Also white count was 20,000.  Patient was given clindamycin 600 mg IV and Norco.  He was feeling much better prior to discharge.  He was given strict return precautions to return to the ED if he became febrile, chills, nausea or vomiting.  He was discharged with a prescription for Bactrim due to financial reasons and Norco if needed for pain.  He is to elevate his leg often and return for recheck or see his PCP in 2 days.     ----------------------------------------- 5:37 PM on 07/01/2018 ----------------------------------------- Attempt to call patient at home with no answer.  Message left for patient to call for follow-up and to see how he  is doing.  ____________________________________________   FINAL CLINICAL IMPRESSION(S) / ED DIAGNOSES  Final diagnoses:  Cellulitis and abscess of left leg  Elevated glucose level     ED Discharge Orders         Ordered    oxyCODONE-acetaminophen (PERCOCET) 5-325 MG tablet  Every 6 hours PRN     07/01/18 1356    sulfamethoxazole-trimethoprim (BACTRIM DS,SEPTRA DS) 800-160 MG tablet  2 times daily     07/01/18 1356           Note:  This document was prepared using Dragon voice recognition software and may include unintentional dictation errors.    Johnn Hai, PA-C 07/01/18 1738    Delman Kitten, MD 07/02/18 [phone]

## 2018-07-03 ENCOUNTER — Encounter: Payer: Self-pay | Admitting: Emergency Medicine

## 2018-07-03 ENCOUNTER — Emergency Department
Admission: EM | Admit: 2018-07-03 | Discharge: 2018-07-03 | Disposition: A | Discharge status: Home / Self Care | Payer: Medicaid Other | Attending: Emergency Medicine | Admitting: Emergency Medicine

## 2018-07-03 ENCOUNTER — Other Ambulatory Visit: Payer: Self-pay

## 2018-07-03 DIAGNOSIS — Z4800 Encounter for change or removal of nonsurgical wound dressing: Secondary | ICD-10-CM | POA: Diagnosis present

## 2018-07-03 DIAGNOSIS — L03116 Cellulitis of left lower limb: Secondary | ICD-10-CM | POA: Insufficient documentation

## 2018-07-03 DIAGNOSIS — F17210 Nicotine dependence, cigarettes, uncomplicated: Secondary | ICD-10-CM | POA: Insufficient documentation

## 2018-07-03 NOTE — ED Provider Notes (Signed)
[facility] Emergency Department Provider Note   ____________________________________________   First MD Initiated Contact with Patient 07/03/18 [phone]     (approximate)  I have reviewed the triage vital signs and the nursing notes.   HISTORY  Chief Complaint Wound recheck    HPI Corey James is a 63 y.o. male patient was seen 3 days ago for cellulitis of the left leg.  Area was not fluctuant and no I&D was performed.  Patient is taken Bactrim DS.  Patient state area is improving.  It was  noted that the patient is short of breath.  Patient is a cigarette smoker.  Patient state he did not want to be evaluated for shortness of breath although this is a new onset.  Patient as he was only here for recheck of his cellulitis of the leg.  Discussed with patient rationale for getting a chest x-ray and he again refused.    Past Medical History:  Diagnosis Date  . Disc degeneration, lumbosacral     There are no active problems to display for this patient.   History reviewed. No pertinent surgical history.  Prior to Admission medications   Medication Sig Start Date End Date Taking? Authorizing Provider  oxyCODONE-acetaminophen (PERCOCET) 5-325 MG tablet Take 1 tablet by mouth every 6 (six) hours as needed for severe pain. 07/01/18 07/01/19  Johnn Hai, PA-C  sulfamethoxazole-trimethoprim (BACTRIM DS,SEPTRA DS) 800-160 MG tablet Take 1 tablet by mouth 2 (two) times daily. 07/01/18   Johnn Hai, PA-C    Allergies Penicillins  No family history on file.  Social History Social History   Tobacco Use  . Smoking status: Current Every Day Smoker    Packs/day: 0.75    Types: Cigarettes  . Smokeless tobacco: Current User  Substance Use Topics  . Alcohol use: Yes  . Drug use: Yes    Review of Systems Constitutional: No fever/chills Eyes: No visual changes. ENT: No sore throat. Cardiovascular: Denies chest pain. Respiratory: Mild dyspnea.     Gastrointestinal: No abdominal pain.  No nausea, no vomiting.  No diarrhea.  No constipation. Genitourinary: Negative for dysuria. Musculoskeletal: Negative for back pain. Skin: Negative for rash.  Redness left thigh. Neurological: Negative for headaches, focal weakness or numbness.   ____________________________________________   PHYSICAL EXAM:  VITAL SIGNS: ED Triage Vitals  Enc Vitals Group     BP 07/03/18 0716 (!) 141/75     Pulse Rate 07/03/18 0716 93     Resp 07/03/18 0716 20     Temp 07/03/18 0716 97.7 F (36.5 C)     Temp Source 07/03/18 0716 Oral     SpO2 07/03/18 0716 95 %     Weight 07/03/18 0717 300 lb (136.1 kg)     Height 07/03/18 0717 5\' 9"  (1.753 m)     Head Circumference --      Peak Flow --      Pain Score 07/03/18 0717 5     Pain Loc --      Pain Edu? --      Excl. in [place]? --    Constitutional: Alert and oriented. Well appearing and in no acute distress.  Morbid obesity. Neck: No stridor.   Cardiovascular: Normal rate, regular rhythm. Grossly normal heart sounds.  Good peripheral circulation. Respiratory: Increased respiratory effort.  No retractions. Lungs CTAB. Musculoskeletal: No lower extremity tenderness nor edema.  No joint effusions. Neurologic:  Normal speech and language. No gross focal neurologic deficits are appreciated. No  gait instability. Skin: Erythematous macular lesion medial left thigh fold. Psychiatric: Mood and affect are normal. Speech and behavior are normal.  ____________________________________________   LABS (all labs ordered are listed, but only abnormal results are displayed)  Labs Reviewed - No data to display ____________________________________________  EKG   ____________________________________________  RADIOLOGY  ED MD interpretation:    Official radiology report(s): No results found.  ____________________________________________   PROCEDURES  Procedure(s) performed: None  Procedures  Critical  Care performed: No  ____________________________________________   INITIAL IMPRESSION / ASSESSMENT AND PLAN / ED COURSE  As part of my medical decision making, I reviewed the following data within the electronic MEDICAL RECORD NUMBER     Resolving cellulitis left leg.  Again it is noted that the patient has have mild dyspnea.  Patient refused evaluation for this observation.  Advised to continue taking antibiotics and to [facility].      ____________________________________________   FINAL CLINICAL IMPRESSION(S) / ED DIAGNOSES  Final diagnoses:  Cellulitis of left thigh     ED Discharge Orders    None       Note:  This document was prepared using Dragon voice recognition software and may include unintentional dictation errors.    Sable Feil, PA-C 07/03/18 [employer]    Lavonia Drafts, MD 07/03/18 [phone]

## 2018-07-03 NOTE — Discharge Instructions (Addendum)
Continue antibiotics

## 2018-07-03 NOTE — ED Notes (Signed)
See triage note  Presents with large red area to left groin  Was dx'd with cellulitis on Friday.states he is not as painful and it has not gotten any larger  Family states area has increased slightly

## 2018-07-03 NOTE — ED Triage Notes (Signed)
Pt was seen here on Friday and treated for cellulitis and abscess of left leg, is here today for a recheck, pt states no drainage, states "it looks like it's gotten better." NAD.

## 2018-07-06 ENCOUNTER — Emergency Department: Payer: Medicaid Other

## 2018-07-06 ENCOUNTER — Encounter: Payer: Self-pay | Admitting: Emergency Medicine

## 2018-07-06 ENCOUNTER — Inpatient Hospital Stay
Admission: EM | Admit: 2018-07-06 | Discharge: 2018-07-15 | DRG: 570 | Disposition: A | Discharge status: Home / Self Care | Payer: Medicaid Other | Attending: Internal Medicine | Admitting: Internal Medicine

## 2018-07-06 ENCOUNTER — Other Ambulatory Visit: Payer: Self-pay

## 2018-07-06 DIAGNOSIS — M7989 Other specified soft tissue disorders: Secondary | ICD-10-CM

## 2018-07-06 DIAGNOSIS — Z791 Long term (current) use of non-steroidal anti-inflammatories (NSAID): Secondary | ICD-10-CM | POA: Diagnosis not present

## 2018-07-06 DIAGNOSIS — B954 Other streptococcus as the cause of diseases classified elsewhere: Secondary | ICD-10-CM | POA: Diagnosis present

## 2018-07-06 DIAGNOSIS — M726 Necrotizing fasciitis: Secondary | ICD-10-CM | POA: Diagnosis present

## 2018-07-06 DIAGNOSIS — Z820 Family history of epilepsy and other diseases of the nervous system: Secondary | ICD-10-CM | POA: Diagnosis not present

## 2018-07-06 DIAGNOSIS — Z6841 Body Mass Index (BMI) 40.0 and over, adult: Secondary | ICD-10-CM | POA: Diagnosis not present

## 2018-07-06 DIAGNOSIS — F17210 Nicotine dependence, cigarettes, uncomplicated: Secondary | ICD-10-CM | POA: Diagnosis present

## 2018-07-06 DIAGNOSIS — Z880 Allergy status to penicillin: Secondary | ICD-10-CM

## 2018-07-06 DIAGNOSIS — M5116 Intervertebral disc disorders with radiculopathy, lumbar region: Secondary | ICD-10-CM | POA: Diagnosis present

## 2018-07-06 DIAGNOSIS — L02419 Cutaneous abscess of limb, unspecified: Secondary | ICD-10-CM | POA: Diagnosis not present

## 2018-07-06 DIAGNOSIS — M1990 Unspecified osteoarthritis, unspecified site: Secondary | ICD-10-CM | POA: Diagnosis not present

## 2018-07-06 DIAGNOSIS — L03119 Cellulitis of unspecified part of limb: Secondary | ICD-10-CM

## 2018-07-06 DIAGNOSIS — R7303 Prediabetes: Secondary | ICD-10-CM | POA: Diagnosis present

## 2018-07-06 DIAGNOSIS — L03116 Cellulitis of left lower limb: Secondary | ICD-10-CM | POA: Diagnosis present

## 2018-07-06 DIAGNOSIS — M1611 Unilateral primary osteoarthritis, right hip: Secondary | ICD-10-CM | POA: Diagnosis present

## 2018-07-06 DIAGNOSIS — L02416 Cutaneous abscess of left lower limb: Principal | ICD-10-CM | POA: Diagnosis present

## 2018-07-06 DIAGNOSIS — Z8249 Family history of ischemic heart disease and other diseases of the circulatory system: Secondary | ICD-10-CM

## 2018-07-06 DIAGNOSIS — B182 Chronic viral hepatitis C: Secondary | ICD-10-CM | POA: Diagnosis present

## 2018-07-06 DIAGNOSIS — S71102A Unspecified open wound, left thigh, initial encounter: Secondary | ICD-10-CM | POA: Diagnosis not present

## 2018-07-06 DIAGNOSIS — R739 Hyperglycemia, unspecified: Secondary | ICD-10-CM | POA: Diagnosis present

## 2018-07-06 DIAGNOSIS — Z823 Family history of stroke: Secondary | ICD-10-CM

## 2018-07-06 LAB — URINALYSIS, COMPLETE (UACMP) WITH MICROSCOPIC
Bacteria, UA: NONE SEEN
GLUCOSE, UA: NEGATIVE mg/dL
Hgb urine dipstick: NEGATIVE
Ketones, ur: NEGATIVE mg/dL
Leukocytes,Ua: NEGATIVE
Nitrite: NEGATIVE
PH: 5 (ref 5.0–8.0)
Protein, ur: 30 mg/dL — AB
Specific Gravity, Urine: 1.034 — ABNORMAL HIGH (ref 1.005–1.030)

## 2018-07-06 LAB — COMPREHENSIVE METABOLIC PANEL
ALT: 32 U/L (ref 0–44)
AST: 38 U/L (ref 15–41)
Albumin: 2.8 g/dL — ABNORMAL LOW (ref 3.5–5.0)
Alkaline Phosphatase: 76 U/L (ref 38–126)
Anion gap: 8 (ref 5–15)
BUN: 19 mg/dL (ref 8–23)
CO2: 30 mmol/L (ref 22–32)
Calcium: 8.6 mg/dL — ABNORMAL LOW (ref 8.9–10.3)
Chloride: 98 mmol/L (ref 98–111)
Creatinine, Ser: 0.95 mg/dL (ref 0.61–1.24)
GFR calc Af Amer: >60 mL/min (ref >60)
GFR calc non Af Amer: >60 mL/min (ref >60)
Glucose, Bld: 125 mg/dL — ABNORMAL HIGH (ref 70–99)
POTASSIUM: 4 mmol/L (ref 3.5–5.1)
Sodium: 136 mmol/L (ref 135–145)
TOTAL PROTEIN: 7.1 g/dL (ref 6.5–8.1)
Total Bilirubin: 0.6 mg/dL (ref 0.3–1.2)

## 2018-07-06 LAB — CBC WITH DIFFERENTIAL/PLATELET
ABS IMMATURE GRANULOCYTES: 0.55 10*3/uL — AB (ref 0.00–0.07)
Basophils Absolute: 0.1 10*3/uL (ref 0.0–0.1)
Basophils Relative: 1 %
Eosinophils Absolute: 0.2 10*3/uL (ref 0.0–0.5)
Eosinophils Relative: 2 %
HCT: 41.4 % (ref 39.0–52.0)
Hemoglobin: 13.8 g/dL (ref 13.0–17.0)
Immature Granulocytes: 5 %
Lymphocytes Relative: 10 %
Lymphs Abs: 1.2 10*3/uL (ref 0.7–4.0)
MCH: 31.1 pg (ref 26.0–34.0)
MCHC: 33.3 g/dL (ref 30.0–36.0)
MCV: 93.2 fL (ref 80.0–100.0)
Monocytes Absolute: 0.7 10*3/uL (ref 0.1–1.0)
Monocytes Relative: 6 %
Neutro Abs: 8.7 10*3/uL — ABNORMAL HIGH (ref 1.7–7.7)
Neutrophils Relative %: 76 %
Platelets: 275 10*3/uL (ref 150–400)
RBC: 4.44 MIL/uL (ref 4.22–5.81)
RDW: 12.4 % (ref 11.5–15.5)
WBC: 11.4 10*3/uL — AB (ref 4.0–10.5)
nRBC: 0 % (ref 0.0–0.2)

## 2018-07-06 LAB — LACTIC ACID, PLASMA
Lactic Acid, Venous: 1.6 mmol/L (ref 0.5–1.9)
Lactic Acid, Venous: 1 mmol/L (ref 0.5–1.9)

## 2018-07-06 LAB — AMMONIA: Ammonia: 32 umol/L (ref 9–35)

## 2018-07-06 MED ORDER — SENNOSIDES-DOCUSATE SODIUM 8.6-50 MG PO TABS: 1.0000 | ORAL_TABLET | Freq: Every evening | ORAL | Status: DC | PRN

## 2018-07-06 MED ORDER — SODIUM CHLORIDE 0.9 % IV SOLN
500.0000 mg | Freq: Three times a day (TID) | INTRAVENOUS | Status: DC
  Administered 2018-07-07 – 2018-07-09 (×8): 500 mg via INTRAVENOUS
  Filled 2018-07-06 (×13): qty 500

## 2018-07-06 MED ORDER — VANCOMYCIN HCL 10 G IV SOLR
1250.0000 mg | Freq: Two times a day (BID) | INTRAVENOUS | Status: DC
  Administered 2018-07-07 – 2018-07-13 (×14): 1250 mg via INTRAVENOUS
  Filled 2018-07-06 (×17): qty 1250

## 2018-07-06 MED ORDER — VANCOMYCIN HCL 10 G IV SOLR
1500.0000 mg | Freq: Once | INTRAVENOUS | Status: AC
  Administered 2018-07-06: 1500 mg via INTRAVENOUS
  Filled 2018-07-06: qty 1500

## 2018-07-06 MED ORDER — IOHEXOL 300 MG/ML  SOLN
125.0000 mL | Freq: Once | INTRAMUSCULAR | Status: AC | PRN
  Administered 2018-07-06: 125 mL via INTRAVENOUS
  Filled 2018-07-06: qty 125

## 2018-07-06 MED ORDER — HYDROCODONE-ACETAMINOPHEN 5-325 MG PO TABS
1.0000 | ORAL_TABLET | ORAL | Status: DC | PRN
  Administered 2018-07-07 – 2018-07-13 (×19): 2 via ORAL
  Filled 2018-07-06 (×16): qty 2
  Filled 2018-07-06: qty 1
  Filled 2018-07-06 (×3): qty 2

## 2018-07-06 MED ORDER — CLINDAMYCIN PHOSPHATE 600 MG/50ML IV SOLN
600.0000 mg | Freq: Four times a day (QID) | INTRAVENOUS | Status: DC
  Administered 2018-07-07 – 2018-07-15 (×32): 600 mg via INTRAVENOUS
  Filled 2018-07-06 (×41): qty 50

## 2018-07-06 MED ORDER — ONDANSETRON HCL 4 MG/2ML IJ SOLN: 4.0000 mg | Freq: Four times a day (QID) | INTRAMUSCULAR | Status: DC | PRN

## 2018-07-06 MED ORDER — NICOTINE 21 MG/24HR TD PT24
21.0000 mg | MEDICATED_PATCH | Freq: Once | TRANSDERMAL | Status: AC
  Administered 2018-07-06 – 2018-07-07 (×2): 21 mg via TRANSDERMAL
  Filled 2018-07-06: qty 1

## 2018-07-06 MED ORDER — ACETAMINOPHEN 650 MG RE SUPP: 650.0000 mg | Freq: Four times a day (QID) | RECTAL | Status: DC | PRN

## 2018-07-06 MED ORDER — METRONIDAZOLE IN NACL 5-0.79 MG/ML-% IV SOLN
500.0000 mg | Freq: Three times a day (TID) | INTRAVENOUS | Status: DC
  Filled 2018-07-06 (×3): qty 100

## 2018-07-06 MED ORDER — ENOXAPARIN SODIUM 40 MG/0.4ML ~~LOC~~ SOLN
40.0000 mg | SUBCUTANEOUS | Status: DC
  Administered 2018-07-06: 40 mg via SUBCUTANEOUS
  Filled 2018-07-06: qty 0.4

## 2018-07-06 MED ORDER — SODIUM CHLORIDE 0.9 % IV SOLN
INTRAVENOUS | Status: DC
  Administered 2018-07-06: 22:00:00 via INTRAVENOUS

## 2018-07-06 MED ORDER — ACETAMINOPHEN 325 MG PO TABS
650.0000 mg | ORAL_TABLET | Freq: Four times a day (QID) | ORAL | Status: DC | PRN
  Administered 2018-07-12 – 2018-07-13 (×3): 650 mg via ORAL
  Filled 2018-07-06 (×3): qty 2

## 2018-07-06 MED ORDER — NICOTINE 21 MG/24HR TD PT24
21.0000 mg | MEDICATED_PATCH | Freq: Every day | TRANSDERMAL | Status: DC
  Administered 2018-07-07: 21 mg via TRANSDERMAL
  Filled 2018-07-06 (×3): qty 1

## 2018-07-06 MED ORDER — VANCOMYCIN HCL IN DEXTROSE 1-5 GM/200ML-% IV SOLN
1000.0000 mg | Freq: Once | INTRAVENOUS | Status: AC
  Administered 2018-07-06: 1000 mg via INTRAVENOUS
  Filled 2018-07-06: qty 200

## 2018-07-06 MED ORDER — ONDANSETRON HCL 4 MG PO TABS
4.0000 mg | ORAL_TABLET | Freq: Four times a day (QID) | ORAL | Status: DC | PRN
  Administered 2018-07-09: 4 mg via ORAL
  Filled 2018-07-06: qty 1

## 2018-07-06 NOTE — Progress Notes (Addendum)
Advanced care plan.  Purpose of the Encounter: CODE STATUS  Parties in Attendance: Patient  Patient's Decision Capacity: Good  Subjective/Patient's story: Presented to emergency room with swelling of the left thigh along with redness which started off as a small blister   Objective/Medical story Patient has infection cellulitis and abscess in the left thigh Needs IV antibiotics and surgical consult for drainage   Goals of care determination:  Advance care directives goals of care and treatment plan discussed Patient for now wants everything done which includes CPR, intubation ventilator if the need arises   CODE STATUS: Full code   Time spent discussing advanced care planning: 16 minutes

## 2018-07-06 NOTE — ED Notes (Signed)
Called to give report and told room is not clean

## 2018-07-06 NOTE — ED Provider Notes (Signed)
[facility] Emergency Department Provider Note  ____________________________________________   None    (approximate)  I have reviewed the triage vital signs and the nursing notes.   HISTORY  Chief Complaint Wound Check    HPI Keyron Pokorski is a 63 y.o. male presents emergency department for a wound recheck.  He was evaluated here  on 07/01/2018 for a large cellulitis in which he had become febrile.  He also was evaluated on 2/10 for recheck.  He states today he noticed that the area has begun to drain clear fluid.  He states it is almost like a blister from a burn.  He states that the swelling in the leg has gone down somewhat.  He denies that he has had fever today.   Past Medical History:  Diagnosis Date  . Disc degeneration, lumbosacral     There are no active problems to display for this patient.   History reviewed. No pertinent surgical history.  Prior to Admission medications   Medication Sig Start Date End Date Taking? Authorizing Provider  oxyCODONE-acetaminophen (PERCOCET) 5-325 MG tablet Take 1 tablet by mouth every 6 (six) hours as needed for severe pain. 07/01/18 07/01/19  Johnn Hai, PA-C  sulfamethoxazole-trimethoprim (BACTRIM DS,SEPTRA DS) 800-160 MG tablet Take 1 tablet by mouth 2 (two) times daily. 07/01/18   Johnn Hai, PA-C    Allergies Penicillins  No family history on file.  Social History Social History   Tobacco Use  . Smoking status: Current Every Day Smoker    Packs/day: 0.75    Types: Cigarettes  . Smokeless tobacco: Current User  Substance Use Topics  . Alcohol use: Yes  . Drug use: Yes    Review of Systems  Constitutional: No fever/chills Eyes: No visual changes. ENT: No sore throat. Respiratory: Denies cough Genitourinary: Negative for dysuria. Musculoskeletal: Negative for back pain. Skin: Negative for rash.  Positive for redness and swelling of the left lower extremity at the  thigh    ____________________________________________   PHYSICAL EXAM:  VITAL SIGNS: ED Triage Vitals  Enc Vitals Group     BP 07/06/18 1416 (!) 186/68     Pulse Rate 07/06/18 1416 86     Resp 07/06/18 1416 20     Temp 07/06/18 1416 97.7 F (36.5 C)     Temp Source 07/06/18 1416 Oral     SpO2 07/06/18 1416 95 %     Weight 07/06/18 1417 299 lb 13.2 oz (136 kg)     Height 07/06/18 1417 5\' 9"  (1.753 m)     Head Circumference --      Peak Flow --      Pain Score 07/06/18 1416 5     Pain Loc --      Pain Edu? --      Excl. in [place]? --     Constitutional: Alert and oriented. Well appearing and in no acute distress. Eyes: Conjunctivae are normal.  Head: Atraumatic. Nose: No congestion/rhinnorhea. Mouth/Throat: Mucous membranes are moist.   Neck:  supple no lymphadenopathy noted Cardiovascular: Normal rate, regular rhythm. Heart sounds are normal Respiratory: Normal respiratory effort.  No retractions, lungs c t a  GU: deferred Musculoskeletal: FROM all extremities, warm and well perfused, the left upper thigh has a large amount of redness with a clear liquid serous type drainage noted and a blister formed. Neurologic:  Normal speech and language.  Skin:  Skin is warm, dry and intact. No rash noted. Psychiatric: Mood and affect  are normal. Speech and behavior are normal.  ____________________________________________   LABS (all labs ordered are listed, but only abnormal results are displayed)  Labs Reviewed  CBC WITH DIFFERENTIAL/PLATELET - Abnormal; Notable for the following components:      Result Value   WBC 11.4 (*)    Neutro Abs 8.7 (*)    Abs Immature Granulocytes 0.55 (*)    All other components within normal limits  COMPREHENSIVE METABOLIC PANEL - Abnormal; Notable for the following components:   Glucose, Bld 125 (*)    Calcium 8.6 (*)    Albumin 2.8 (*)    All other components within normal limits  CULTURE, BLOOD (ROUTINE X 2)  CULTURE, BLOOD (ROUTINE X 2)   LACTIC ACID, PLASMA  LACTIC ACID, PLASMA   ____________________________________________   ____________________________________________  RADIOLOGY  Ultrasound left leg  Complex fluid collection in the medial upper thigh measures  approximately 11.5 x 3.0 x 7.8 cm. Differential considerations  include subcutaneous abscess versus hematoma.     ____________________________________________   PROCEDURES  Procedure(s) performed: Saline lock, vancomycin 1 g IV  Procedures    ____________________________________________   INITIAL IMPRESSION / ASSESSMENT AND PLAN / ED COURSE  Pertinent labs & imaging results that were available during my care of the patient were reviewed by me and considered in my medical decision making (see chart for details).   63 year old male presents emergency department with recheck of a large abscess on the left upper leg.  He was seen and diagnosed with cellulitis on 2/8, at that time he had a temperature of 102, CBC was 20.5 on 07/01/2018 Today he is concerned because he has some clear drainage from this area which is collected into a blister type pocket  Physical exam shows a large amount of redness and swelling along with tenderness in the left upper thigh, blisterlike fluid is noted at the inner aspect.  I asked the PA that saw him originally to look at this area and she states it looks better than on the first day but still looks worse than she thinks that should look at this time.  Ultrasound of the left upper thigh shows a large abscess versus hematoma.  Due to the redness and the fever I would suggest that this is more of a abscess.  CBC has a WBC of 11.5 which is improved from 2/8, conference metabolic panel is basically normal, lactic acid is normal, blood cultures are pending  Saline lock, vancomycin 1 g IV  Paged on-call surgery   Report given to [facility].  He is to accept care of patient at 6 PM.  We are still awaiting phone call from  surgery.  As part of my medical decision making, I reviewed the following data within the [place] History obtained from family, Nursing notes reviewed and incorporated, Labs reviewed CBC has WBC of 11.5, comprehensive metabolic panel is normal, lactic acid is normal, Old chart reviewed, Radiograph reviewed for sound shows a large abscess, Notes from prior ED visits and [place] Controlled Substance Database  ____________________________________________   FINAL CLINICAL IMPRESSION(S) / ED DIAGNOSES  Final diagnoses:  Abscess of left lower extremity      NEW MEDICATIONS STARTED DURING THIS VISIT:  New Prescriptions   No medications on file     Note:  This document was prepared using Dragon voice recognition software and may include unintentional dictation errors.    Versie Starks, PA-C 07/06/18 1751    Merlyn Lot, MD 07/06/18 2108

## 2018-07-06 NOTE — Consult Note (Signed)
Pharmacy Antibiotic Note  Corey James is a 63 y.o. male admitted on 07/06/2018 with cellulitis.  Pharmacy has been consulted for Vancomycin dosing.  Plan: Loading dose: Vancomycin 2500 mg once (given as two doses 1000mg  plus 1500 mg) Vancomycin 1250 mg IV Q 12 hrs. Goal AUC 400-550. Expected AUC: 509.4 SCr used: 0.95   Height: 5\' 9"  (175.3 cm) Weight: 299 lb 13.2 oz (136 kg) IBW/kg (Calculated) : 70.7  Temp (24hrs), Avg:97.7 F (36.5 C), Min:97.7 F (36.5 C), Max:97.7 F (36.5 C)  Recent Labs  Lab 07/01/18 1218 07/06/18 1645  WBC 20.5* 11.4*  CREATININE 0.90 0.95  LATICACIDVEN  --  1.6    Estimated Creatinine Clearance: 111.8 mL/min (by C-G formula based on SCr of 0.95 mg/dL).    Allergies  Allergen Reactions  . Penicillins Itching and Rash    Antimicrobials this admission: Vanco 2/13 >>  Metronidazole 2/12 >>   Dose adjustments this admission:   Microbiology results: 2/13 BCx: pending  Thank you for allowing pharmacy to be a part of this patient's care.  Forrest Moron, PharmD Clinical Pharmacist 07/06/2018 8:24 PM

## 2018-07-06 NOTE — ED Provider Notes (Signed)
-----------------------------------------   6:52 PM on 07/06/2018 -----------------------------------------   Blood pressure (!) 186/68, pulse 86, temperature 97.7 F (36.5 C), temperature source Oral, resp. rate 20, height 5\' 9"  (1.753 m), weight 136 kg, SpO2 95 %.  Assuming care from Ashok Cordia, PA-C.  In short, Corey James is a 63 y.o. male with a chief complaint of Wound Check .  Refer to the original H&P for additional details.  The current plan of care is to await surgical consult.  Patient presents emergency department for second wound check of cellulitis.  Patient reports that the edema had been improving however the pain has worsened.  Patient also has a "blister" that is been draining to the medial left thigh.  I assumed care from Ashok Cordia, PA-C awaiting surgical consult.  I assessed the patient myself with erythema, edema as well as overlying skin changes to the region.  Palpation reveals significant firmness in the left groin and medial thigh.  No palpable fluctuance though there is a significant underlying abscess on ultrasound.  Overall, patient's labs showed improving leukocytosis compared to previous lab work 5 days ago.  There is currently no elevated lactic acid. However patient has had increased confusion according to the patient's family member who is in the room with the patient.  Patient did receive vancomycin in the emergency department today.  Awaiting surgical consult at this time.  ----------------------------------------- 7:12 PM on 07/06/2018 -----------------------------------------  Discussed the patient's case with on-call surgeon, Dr. Dahlia Byes.  He recommends patient is a good surgical candidate but asked that hospitalist admit due to increasing confusion.  Surgeon also request CT of the left lower extremity for surgical planning.  ----------------------------------------- 7:26 PM on 07/06/2018 -----------------------------------------  Patient will be  admitted to medicine with surgical consult. Dr. Estanislado Pandy will admit patient to hospital. Patient care turned over to hospitalist service.  Diagnosis: Left lower extremity abscess       Darletta Moll, PA-C 07/06/18 1940    Corey Mew, MD 07/07/18 [phone]

## 2018-07-06 NOTE — H&P (Addendum)
[facility] [place] NAME: Corey James    MR#:  [id number]  DATE OF BIRTH:  1956-04-28  DATE OF ADMISSION:  07/06/2018  PRIMARY CARE PHYSICIAN: System, Provider Not In   REQUESTING/REFERRING PHYSICIAN:   CHIEF COMPLAINT:   Chief Complaint  Patient presents with  . Wound Check    HISTORY OF PRESENT ILLNESS: Corey James  is a 63 y.o. male with a known history of lumbosacral disc degeneration presented to the emergency room for redness and swelling in the left thigh.  Started off as a small blister which increased in size later on became indurated.  There is swelling and redness in the left thigh along with tenderness.  Pain is aching in nature 8 out of 10 on a scale of 1-10.  Was evaluated in the emergency room with ultrasound of the left thigh which showed complex fluid collection suggesting abscess.  No evidence of DVT.  CT left thigh was also done but pending report.  PAST MEDICAL HISTORY:   Past Medical History:  Diagnosis Date  . Disc degeneration, lumbosacral     PAST SURGICAL HISTORY: Appendectomy  SOCIAL HISTORY:  Social History   Tobacco Use  . Smoking status: Current Every Day Smoker    Packs/day: 0.75    Types: Cigarettes  . Smokeless tobacco: Current User  Substance Use Topics  . Alcohol use: Yes    FAMILY HISTORY: Mother and father no history of diabetes, CAD, heart disease  DRUG ALLERGIES:  Allergies  Allergen Reactions  . Penicillins Itching and Rash    REVIEW OF SYSTEMS:   CONSTITUTIONAL: No fever, fatigue or weakness.  EYES: No blurred or double vision.  EARS, NOSE, AND THROAT: No tinnitus or ear pain.  RESPIRATORY: No cough, shortness of breath, wheezing or hemoptysis.  CARDIOVASCULAR: No chest pain, orthopnea, edema.  GASTROINTESTINAL: No nausea, vomiting, diarrhea or abdominal pain.  GENITOURINARY: No dysuria, hematuria.  ENDOCRINE: No polyuria, nocturia,  HEMATOLOGY: No anemia, easy  bruising or bleeding SKIN: Redness and swelling in the left thigh MUSCULOSKELETAL: No joint pain or arthritis.   NEUROLOGIC: No tingling, numbness, weakness.  PSYCHIATRY: No anxiety or depression.   MEDICATIONS AT HOME:  Prior to Admission medications   Medication Sig Start Date End Date Taking? Authorizing Provider  docusate sodium (COLACE) 100 MG capsule Take 100 mg by mouth 2 (two) times daily.   Yes [provider]  gabapentin (NEURONTIN) 300 MG capsule Take 300 mg by mouth daily. 06/27/18 07/17/18 Yes [provider]  HYDROcodone-acetaminophen (NORCO) 7.5-325 MG tablet Take 1 tablet by mouth every 6 (six) hours as needed for moderate pain.   Yes [provider]  oxyCODONE-acetaminophen (PERCOCET) 5-325 MG tablet Take 1 tablet by mouth every 6 (six) hours as needed for severe pain. 07/01/18 07/01/19 Yes Summers, Rhonda L, PA-C  sulfamethoxazole-trimethoprim (BACTRIM DS,SEPTRA DS) 800-160 MG tablet Take 1 tablet by mouth 2 (two) times daily. 07/01/18  Yes Summers, Suanne Marker L, PA-C      PHYSICAL EXAMINATION:   VITAL SIGNS: Blood pressure (!) 186/68, pulse 86, temperature 97.7 F (36.5 C), temperature source Oral, resp. rate 20, height 5\' 9"  (1.753 m), weight 136 kg, SpO2 95 %.  GENERAL:  63 y.o.-year-old patient lying in the bed with no acute distress.  EYES: Pupils equal, round, reactive to light and accommodation. No scleral icterus. Extraocular muscles intact.  HEENT: Head atraumatic, normocephalic. Oropharynx and nasopharynx clear.  NECK:  Supple, no jugular venous distention. No  thyroid enlargement, no tenderness.  LUNGS: Normal breath sounds bilaterally, no wheezing, rales,rhonchi or crepitation. No use of accessory muscles of respiration.  CARDIOVASCULAR: S1, S2 normal. No murmurs, rubs, or gallops.  ABDOMEN: Soft, nontender, nondistended. Bowel sounds present. No organomegaly or mass.  EXTREMITIES: No pedal edema, cyanosis, or clubbing.  Induration and swelling  in the left thigh measuring almost 11 / 10 centimeters NEUROLOGIC: Cranial nerves II through XII are intact. Muscle strength 5/5 in all extremities. Sensation intact. Gait not checked.  PSYCHIATRIC: The patient is alert and oriented x 3.  SKIN: Redness of the skin of the left upper thigh  LABORATORY PANEL:   CBC Recent Labs  Lab 07/01/18 1218 07/06/18 1645  WBC 20.5* 11.4*  HGB 14.6 13.8  HCT 42.4 41.4  PLT 140* 275  MCV 92.4 93.2  MCH 31.8 31.1  MCHC 34.4 33.3  RDW 12.4 12.4  LYMPHSABS 0.8 1.2  MONOABS 1.2* 0.7  EOSABS 0.0 0.2  BASOSABS 0.1 0.1   ------------------------------------------------------------------------------------------------------------------  Chemistries  Recent Labs  Lab 07/01/18 1218 07/06/18 1645  NA 134* 136  K 3.7 4.0  CL 100 98  CO2 25 30  GLUCOSE 211* 125*  BUN 17 19  CREATININE 0.90 0.95  CALCIUM 8.3* 8.6*  AST 21 38  ALT 27 32  ALKPHOS 61 76  BILITOT 2.1* 0.6   ------------------------------------------------------------------------------------------------------------------ estimated creatinine clearance is 111.8 mL/min (by C-G formula based on SCr of 0.95 mg/dL). ------------------------------------------------------------------------------------------------------------------ No results for input(s): TSH, T4TOTAL, T3FREE, THYROIDAB in the last 72 hours.  Invalid input(s): FREET3   Coagulation profile No results for input(s): INR, PROTIME in the last 168 hours. ------------------------------------------------------------------------------------------------------------------- No results for input(s): DDIMER in the last 72 hours. -------------------------------------------------------------------------------------------------------------------  Cardiac Enzymes No results for input(s): CKMB, TROPONINI, MYOGLOBIN in the last 168 hours.  Invalid input(s):  CK ------------------------------------------------------------------------------------------------------------------ Invalid input(s): POCBNP  ---------------------------------------------------------------------------------------------------------------  Urinalysis    Component Value Date/Time   COLORURINE AMBER (A) 07/06/2018 1645   APPEARANCEUR CLOUDY (A) 07/06/2018 1645   LABSPEC 1.034 (H) 07/06/2018 1645   PHURINE 5.0 07/06/2018 1645   GLUCOSEU NEGATIVE 07/06/2018 1645   HGBUR NEGATIVE 07/06/2018 1645   BILIRUBINUR SMALL (A) 07/06/2018 [place] 07/06/2018 1645   PROTEINUR 30 (A) 07/06/2018 1645   NITRITE NEGATIVE 07/06/2018 1645   LEUKOCYTESUR NEGATIVE 07/06/2018 1645     RADIOLOGY: Dg Chest 1 View  Result Date: 07/06/2018 CLINICAL DATA:  Preop.  Fever EXAM: CHEST  1 VIEW COMPARISON:  None. FINDINGS: Mild cardiomegaly. No confluent airspace opacities, effusions or edema. No acute bony abnormality. IMPRESSION: Mild cardiomegaly.  No active disease. Electronically Signed   By: Rolm Baptise M.D.   On: 07/06/2018 19:54   US Venous Img Lower Unilateral Left  Result Date: 07/06/2018 CLINICAL DATA:  63 year old male with left lower extremity redness, swelling, cellulitis and possible abscess. EXAM: LEFT LOWER EXTREMITY VENOUS DOPPLER ULTRASOUND TECHNIQUE: Gray-scale sonography with graded compression, as well as color Doppler and duplex ultrasound were performed to evaluate the lower extremity deep venous systems from the level of the common femoral vein and including the common femoral, femoral, profunda femoral, popliteal and calf veins including the posterior tibial, peroneal and gastrocnemius veins when visible. The superficial great saphenous vein was also interrogated. Spectral Doppler was utilized to evaluate flow at rest and with distal augmentation maneuvers in the common femoral, femoral and popliteal veins. COMPARISON:  None. FINDINGS: Contralateral Common  Femoral Vein: Respiratory phasicity is normal and symmetric with the symptomatic side. No evidence  of thrombus. Normal compressibility. Common Femoral Vein: No evidence of thrombus. Normal compressibility, respiratory phasicity and response to augmentation. Saphenofemoral Junction: No evidence of thrombus. Normal compressibility and flow on color Doppler imaging. Profunda Femoral Vein: No evidence of thrombus. Normal compressibility and flow on color Doppler imaging. Femoral Vein: No evidence of thrombus. Normal compressibility, respiratory phasicity and response to augmentation. Popliteal Vein: No evidence of thrombus. Normal compressibility, respiratory phasicity and response to augmentation. Calf Veins: No evidence of thrombus. Normal compressibility and flow on color Doppler imaging. Superficial Great Saphenous Vein: No evidence of thrombus. Normal compressibility. Venous Reflux:  None. Other Findings: Complex fluid collection in the medial upper thigh in the region of the inguinal crease measures approximately 11.5 x 3.0 by 7.8 cm. No evidence of internal vascularity on color Doppler imaging to suggest a solid mass. IMPRESSION: No evidence of deep venous thrombosis. Complex fluid collection in the medial upper thigh measures approximately 11.5 x 3.0 x 7.8 cm. Differential considerations include subcutaneous abscess versus hematoma. Electronically Signed   By: Jacqulynn Cadet M.D.   On: 07/06/2018 16:58    EKG: No orders found for this or any previous visit.  IMPRESSION AND PLAN:  63 year old male patient with history of degenerative disc disease of lumbar spine currently presented to the emergency room for pain and swelling of the left thigh  -Left thigh abscess Surgery consult Start patient on IV vancomycin and IV Flagyl broad-spectrum antibiotics Surgery consultation for I&D of the abscess Follow-up CT left lower extremity  -Left thigh pain secondary to abscess Control pain with oral  narcotics and PRN IV morphine  -DVT prophylaxis Once hematoma ruled out with CT thigh Start subcu Lovenox 40 mg daily  -Tobacco abuse Tobacco cessation counseled to the patient for 6 minutes Nicotine patch offered  All the records are reviewed and case discussed with ED provider. Management plans discussed with the patient, family and they are in agreement.  CODE STATUS:Full code    TOTAL TIME TAKING CARE OF THIS PATIENT: 53 minutes.    Saundra Shelling M.D on 07/06/2018 at 8:08 PM  Between 7am to 6pm - Pager - [phone]  After 6pm go to [email or web address] - password [email or web address]  [facility]  Office  [phone]  CC: Primary care physician; System, Provider Not In

## 2018-07-06 NOTE — Progress Notes (Signed)
Pharmacy Antibiotic Note  Corey James is a 63 y.o. male admitted on 07/06/2018 with wound infection.  Pharmacy has been consulted for imipenem-cilastatin dosing.  Plan: Will start patient on imipenem-cilastatin 500 mg IV q8h  Height: 5\' 9"  (175.3 cm) Weight: 299 lb 13.2 oz (136 kg) IBW/kg (Calculated) : 70.7  Temp (24hrs), Avg:97.7 F (36.5 C), Min:97.7 F (36.5 C), Max:97.7 F (36.5 C)  Recent Labs  Lab 07/01/18 1218 07/06/18 1645 07/06/18 2134  WBC 20.5* 11.4*  --   CREATININE 0.90 0.95  --   LATICACIDVEN  --  1.6 1.0    Estimated Creatinine Clearance: 111.8 mL/min (by C-G formula based on SCr of 0.95 mg/dL).    Allergies  Allergen Reactions  . Penicillins Itching and Rash    Thank you for allowing pharmacy to be a part of this patient's care.  Tobie Lords, PharmD, BCPS Clinical Pharmacist 07/06/2018

## 2018-07-06 NOTE — Consult Note (Signed)
Patient ID: Corey James, male   DOB: 12-Apr-1956, 63 y.o.   MRN: [id number]  HPI Corey James is a 63 y.o. male seen in consultation at the request of Mr Lucienne Minks Dr.Pyreddy. Case d/w them in detail .  Apparently he has been having left lower extremity pain for the last 5 days associated with erythema.  He does report new onset of fevers.  He was taking antibiotics as an outpatient.  He reports that the pain is intermittent mild to moderate intensity and sharp in nature.  No specific alleviating or aggravating factors. He is morbidly obese and prediabetic and he smokes daily.  CT scan personal review as well as ultrasound there is evidence of significant cellulitis and induration in the left thigh.  There is no discrete or clear-cut abscess.  There is no evidence of necrotizing soft tissue infection. Does have a white count of 11.4 CMP normal.  Normal lactate. He Is able to perform more than 4 METS of activity without any shortness of breath or chest pain  HPI  Past Medical History:  Diagnosis Date  . Disc degeneration, lumbosacral     History reviewed. No pertinent surgical history.  Family History  Problem Relation Age of Onset  . Dementia Mother   . Alzheimer's disease Mother   . CVA Father   . Cancer - Colon Father   . Heart attack Brother   . Hypertension Brother     Social History Social History   Tobacco Use  . Smoking status: Current Every Day Smoker    Packs/day: 1.00    Years: 40.00    Pack years: 40.00    Types: Cigarettes  . Smokeless tobacco: Current User  Substance Use Topics  . Alcohol use: Yes    Comment: 1/5 of vodka per month   . Drug use: Yes    Allergies  Allergen Reactions  . Penicillins Itching and Rash    Current Facility-Administered Medications  Medication Dose Route Frequency Provider Last Rate Last Dose  . 0.9 %  sodium chloride infusion   Intravenous Continuous Saundra Shelling, MD 75 mL/hr at 07/06/18 2146    . acetaminophen  (TYLENOL) tablet 650 mg  650 mg Oral Q6H PRN Saundra Shelling, MD       Or  . acetaminophen (TYLENOL) suppository 650 mg  650 mg Rectal Q6H PRN Saundra Shelling, MD      . Derrill Memo ON 07/07/2018] clindamycin (CLEOCIN) IVPB 600 mg  600 mg Intravenous Q6H Lianne Carreto F, MD      . enoxaparin (LOVENOX) injection 40 mg  40 mg Subcutaneous Q24H Saundra Shelling, MD   40 mg at 07/06/18 2254  . HYDROcodone-acetaminophen (NORCO/VICODIN) 5-325 MG per tablet 1-2 tablet  1-2 tablet Oral Q4H PRN Pyreddy, Pavan, MD      . imipenem-cilastatin (PRIMAXIN) 500 mg in sodium chloride 0.9 % 100 mL IVPB  500 mg Intravenous Q8H Kamrie Fanton F, MD      . nicotine (NICODERM CQ - dosed in mg/24 hours) patch 21 mg  21 mg Transdermal Once Versie Starks, PA-C   21 mg at 07/06/18 1821  . [START ON 07/07/2018] nicotine (NICODERM CQ - dosed in mg/24 hours) patch 21 mg  21 mg Transdermal Daily Pyreddy, Pavan, MD      . ondansetron (ZOFRAN) tablet 4 mg  4 mg Oral Q6H PRN Pyreddy, Pavan, MD       Or  . ondansetron (ZOFRAN) injection 4 mg  4 mg Intravenous Q6H PRN Pyreddy,  Reatha Harps, MD      . senna-docusate (Senokot-S) tablet 1 tablet  1 tablet Oral QHS PRN Saundra Shelling, MD      . Derrill Memo ON 07/07/2018] vancomycin (VANCOCIN) 1,250 mg in sodium chloride 0.9 % 250 mL IVPB  1,250 mg Intravenous Q12H Shari Prows, RPH      . vancomycin (VANCOCIN) 1,500 mg in sodium chloride 0.9 % 500 mL IVPB  1,500 mg Intravenous Once Shari Prows, RPH 250 mL/hr at 07/06/18 2215 1,500 mg at 07/06/18 2215     Review of Systems Full ROS  was asked and was negative except for the information on the HPI  Physical Exam Blood pressure (!) 164/73, pulse 77, temperature 97.7 F (36.5 C), temperature source Oral, resp. rate 20, height 5\' 9"  (1.753 m), weight 136 kg, SpO2 96 %. CONSTITUTIONAL: Obese male In NAD EYES: Pupils are equal, round, and reactive to light, Sclera are non-icteric. EARS, NOSE, MOUTH AND THROAT: The oropharynx is clear. The  oral mucosa is pink and moist. Hearing is intact to voice. LYMPH NODES:  Lymph nodes in the neck are normal. RESPIRATORY:  Lungs are clear. There is normal respiratory effort, with equal breath sounds bilaterally, and without pathologic use of accessory muscles. CARDIOVASCULAR: Heart is regular without murmurs, gallops, or rubs. GI: The abdomen is  soft, nontender, and nondistended. There are no palpable masses. There is no hepatosplenomegaly. There are normal bowel sounds in all quadrants. GU: Rectal deferred.   MUSCULOSKELETAL: Normal muscle strength and tone. No cyanosis or edema.   SKIN: There is evidence of blanching erythema involving the anterior and medial aspect of his thigh.  There is tenderness palpation.  There is no crepitus there is no evidence of necrotizing infection.  There is no definitive evidence of abscess or fluctuance. NEUROLOGIC: Motor and sensation is grossly normal. Cranial nerves are grossly intact. PSYCH:  Oriented to person, place and time. Affect is normal.  Data Reviewed  I have personally reviewed the patient's imaging, laboratory findings and medical records.    Assessment/Plan 63 year old morbidly obese male presents with complex cellulitis of the left thigh.  I will add clindamycin as well as meropenem given the severity.  There is significant soft tissue infection.  I will reevaluate him tomorrow and determine the need for I&D.  Currently there is no clear-cut evidence of a collection rather than significant induration.  Hopefully this will respond to antibiotics but if  it does not he will need treatment.  Discussed with the patient in detail.  He understands. We will place NPO after MN in case surgery needs to be performed   Caroleen Hamman, MD [place] [facility] 07/06/2018, 11:09 PM

## 2018-07-06 NOTE — ED Notes (Signed)
ED TO INPATIENT HANDOFF REPORT  Name/Age/Gender Corey James 63 y.o. male  Code Status   Home/SNF/Other Home  Chief Complaint infection recheck  Level of Care/Admitting Diagnosis ED Disposition    ED Disposition Condition [facility] Area: [place] [100120]  Level of Care: Med-Surg [16]  Diagnosis: Thigh abscess [151761]  Admitting Physician: Saundra Shelling [[id number]]  Attending Physician: Saundra Shelling [[id number]]  Estimated length of stay: past midnight tomorrow  Certification:: I certify this patient will need inpatient services for at least 2 midnights  PT Class (Do Not Modify): Inpatient [101]  PT Acc Code (Do Not Modify): Private [1]       Medical History Past Medical History:  Diagnosis Date  . Disc degeneration, lumbosacral     Allergies Allergies  Allergen Reactions  . Penicillins Itching and Rash    IV Location/Drains/Wounds Patient Lines/Drains/Airways Status   Active Line/Drains/Airways    Name:   Placement date:   Placement time:   Site:   Days:   Peripheral IV 07/06/18 Right Hand   07/06/18    1649    Hand   less than 1   Wound / Incision (Open or Dehisced) 01/07/16 Laceration Leg Right;Anterior;Lower pucture wound.   01/07/16    1418    Leg   911          Labs/Imaging Results for orders placed or performed during the hospital encounter of 07/06/18 (from the past 48 hour(s))  CBC with Differential     Status: Abnormal   Collection Time: 07/06/18  4:45 PM  Result Value Ref Range   WBC 11.4 (H) 4.0 - 10.5 K/uL   RBC 4.44 4.22 - 5.81 MIL/uL   Hemoglobin 13.8 13.0 - 17.0 g/dL   HCT 41.4 39.0 - 52.0 %   MCV 93.2 80.0 - 100.0 fL   MCH 31.1 26.0 - 34.0 pg   MCHC 33.3 30.0 - 36.0 g/dL   RDW 12.4 11.5 - 15.5 %   Platelets 275 150 - 400 K/uL   nRBC 0.0 0.0 - 0.2 %   Neutrophils Relative % 76 %   Neutro Abs 8.7 (H) 1.7 - 7.7 K/uL   Lymphocytes Relative 10 %   Lymphs Abs 1.2 0.7 - 4.0 K/uL   Monocytes  Relative 6 %   Monocytes Absolute 0.7 0.1 - 1.0 K/uL   Eosinophils Relative 2 %   Eosinophils Absolute 0.2 0.0 - 0.5 K/uL   Basophils Relative 1 %   Basophils Absolute 0.1 0.0 - 0.1 K/uL   Immature Granulocytes 5 %   Abs Immature Granulocytes 0.55 (H) 0.00 - 0.07 K/uL    Comment: Performed at [facility], [address] [place], [place] [address]  Lactic acid, plasma     Status: None   Collection Time: 07/06/18  4:45 PM  Result Value Ref Range   Lactic Acid, Venous 1.6 0.5 - 1.9 mmol/L    Comment: Performed at [facility], [address] [place], [place] [address]  Comprehensive metabolic panel     Status: Abnormal   Collection Time: 07/06/18  4:45 PM  Result Value Ref Range   Sodium 136 135 - 145 mmol/L   Potassium 4.0 3.5 - 5.1 mmol/L    Comment: HEMOLYSIS AT THIS LEVEL MAY AFFECT RESULT   Chloride 98 98 - 111 mmol/L   CO2 30 22 - 32 mmol/L   Glucose, Bld 125 (H) 70 - 99 mg/dL   BUN 19 8 -  23 mg/dL   Creatinine, Ser 0.95 0.61 - 1.24 mg/dL   Calcium 8.6 (L) 8.9 - 10.3 mg/dL   Total Protein 7.1 6.5 - 8.1 g/dL   Albumin 2.8 (L) 3.5 - 5.0 g/dL   AST 38 15 - 41 U/L   ALT 32 0 - 44 U/L   Alkaline Phosphatase 76 38 - 126 U/L   Total Bilirubin 0.6 0.3 - 1.2 mg/dL   GFR calc non Af Amer >60 >60 mL/min   GFR calc Af Amer >60 >60 mL/min   Anion gap 8 5 - 15    Comment: Performed at [facility], [address] [place], [place] [address]  Urinalysis, Complete w Microscopic     Status: Abnormal   Collection Time: 07/06/18  4:45 PM  Result Value Ref Range   Color, Urine AMBER (A) YELLOW    Comment: BIOCHEMICALS MAY BE AFFECTED BY COLOR   APPearance CLOUDY (A) CLEAR   Specific Gravity, Urine 1.034 (H) 1.005 - 1.030   pH 5.0 5.0 - 8.0   Glucose, UA NEGATIVE NEGATIVE mg/dL   Hgb urine dipstick NEGATIVE NEGATIVE   Bilirubin Urine SMALL (A) NEGATIVE   Ketones, ur NEGATIVE NEGATIVE mg/dL   Protein, ur 30 (A) NEGATIVE mg/dL   Nitrite NEGATIVE NEGATIVE    Leukocytes,Ua NEGATIVE NEGATIVE   RBC / HPF 11-20 0 - 5 RBC/hpf   WBC, UA 0-5 0 - 5 WBC/hpf   Bacteria, UA NONE SEEN NONE SEEN   Squamous Epithelial / LPF 0-5 0 - 5   Mucus PRESENT     Comment: Performed at [facility], [address], [place], [place] [address]   Dg Chest 1 View  Result Date: 07/06/2018 CLINICAL DATA:  Preop.  Fever EXAM: CHEST  1 VIEW COMPARISON:  None. FINDINGS: Mild cardiomegaly. No confluent airspace opacities, effusions or edema. No acute bony abnormality. IMPRESSION: Mild cardiomegaly.  No active disease. Electronically Signed   By: Rolm Baptise M.D.   On: 07/06/2018 19:54   Ct Femur Left W Contrast  Result Date: 07/06/2018 CLINICAL DATA:  Left thigh abscess. EXAM: CT OF THE LOWER RIGHT EXTREMITY WITH CONTRAST TECHNIQUE: Multidetector CT imaging of the lower right extremity was performed according to the standard protocol following intravenous contrast administration. COMPARISON:  None. CONTRAST:  139mL OMNIPAQUE IOHEXOL 300 MG/ML  SOLN FINDINGS: No fracture or other acute bony abnormality is noted. No definite abnormality seen in the visualized portion of the pelvis. Mildly enlarged left inguinal lymph nodes are noted which most likely are inflammatory in etiology. Stranding of the subcutaneous tissues of the medial and anterior aspect of the proximal left thigh is noted most consistent with cellulitis. There are multiple irregular fluid collections seen in this area which potentially could represent possible developing abscess or abscesses. IMPRESSION: Findings consistent with cellulitis involving the subcutaneous tissues involving the medial and anterior aspect of the proximal left thigh. Multiple irregular low densities are noted within this area concerning for small fluid collections with ill-defined margins; potentially these may represent multiple developing abscesses. However, no definite wall enhancement is noted at this time. Electronically Signed   By:  Marijo Conception, M.D.   On: 07/06/2018 20:11   US Venous Img Lower Unilateral Left  Result Date: 07/06/2018 CLINICAL DATA:  63 year old male with left lower extremity redness, swelling, cellulitis and possible abscess. EXAM: LEFT LOWER EXTREMITY VENOUS DOPPLER ULTRASOUND TECHNIQUE: Gray-scale sonography with graded compression, as well as color Doppler and duplex ultrasound were performed to evaluate  the lower extremity deep venous systems from the level of the common femoral vein and including the common femoral, femoral, profunda femoral, popliteal and calf veins including the posterior tibial, peroneal and gastrocnemius veins when visible. The superficial great saphenous vein was also interrogated. Spectral Doppler was utilized to evaluate flow at rest and with distal augmentation maneuvers in the common femoral, femoral and popliteal veins. COMPARISON:  None. FINDINGS: Contralateral Common Femoral Vein: Respiratory phasicity is normal and symmetric with the symptomatic side. No evidence of thrombus. Normal compressibility. Common Femoral Vein: No evidence of thrombus. Normal compressibility, respiratory phasicity and response to augmentation. Saphenofemoral Junction: No evidence of thrombus. Normal compressibility and flow on color Doppler imaging. Profunda Femoral Vein: No evidence of thrombus. Normal compressibility and flow on color Doppler imaging. Femoral Vein: No evidence of thrombus. Normal compressibility, respiratory phasicity and response to augmentation. Popliteal Vein: No evidence of thrombus. Normal compressibility, respiratory phasicity and response to augmentation. Calf Veins: No evidence of thrombus. Normal compressibility and flow on color Doppler imaging. Superficial Great Saphenous Vein: No evidence of thrombus. Normal compressibility. Venous Reflux:  None. Other Findings: Complex fluid collection in the medial upper thigh in the region of the inguinal crease measures approximately 11.5 x  3.0 by 7.8 cm. No evidence of internal vascularity on color Doppler imaging to suggest a solid mass. IMPRESSION: No evidence of deep venous thrombosis. Complex fluid collection in the medial upper thigh measures approximately 11.5 x 3.0 x 7.8 cm. Differential considerations include subcutaneous abscess versus hematoma. Electronically Signed   By: Jacqulynn Cadet M.D.   On: 07/06/2018 16:58    Pending Labs Unresulted Labs (From admission, onward)    Start     Ordered   07/06/18 1913  Ammonia  ONCE - STAT,   STAT     07/06/18 1912   07/06/18 1525  Blood culture (routine x 2)  BLOOD CULTURE X 2,   STAT     07/06/18 1525   07/06/18 1524  Lactic acid, plasma  Now then every 2 hours,   STAT     07/06/18 1525   Signed and Held  HIV antibody (Routine Testing)  Once,   R     Signed and Held   Signed and Held  Basic metabolic panel  Tomorrow morning,   R     Signed and Held   Signed and Held  CBC  Tomorrow morning,   R     Signed and Held          Vitals/Pain Today's Vitals   07/06/18 1416 07/06/18 1417  BP: (!) 186/68   Pulse: 86   Resp: 20   Temp: 97.7 F (36.5 C)   TempSrc: Oral   SpO2: 95%   Weight:  136 kg  Height:  5\' 9"  (1.753 m)  PainSc: 5      Isolation Precautions No active isolations  Medications Medications  nicotine (NICODERM CQ - dosed in mg/24 hours) patch 21 mg (21 mg Transdermal Patch Applied 07/06/18 1821)  nicotine (NICODERM CQ - dosed in mg/24 hours) patch 21 mg (has no administration in time range)  vancomycin (VANCOCIN) 1,500 mg in sodium chloride 0.9 % 500 mL IVPB (has no administration in time range)  metroNIDAZOLE (FLAGYL) IVPB 500 mg (has no administration in time range)  vancomycin (VANCOCIN) 1,250 mg in sodium chloride 0.9 % 250 mL IVPB (has no administration in time range)  vancomycin (VANCOCIN) IVPB 1000 mg/200 mL premix (0 mg Intravenous Stopped 07/06/18 1810)  iohexol (  OMNIPAQUE) 300 MG/ML solution 125 mL (125 mLs Intravenous Contrast Given  07/06/18 1925)    Mobility walks

## 2018-07-06 NOTE — ED Notes (Signed)
Pre existing left groin cellulitis now draining. Per pt looks better. Area red warm, firm, unable to visualize drainage

## 2018-07-06 NOTE — ED Triage Notes (Signed)
Here for recheck left leg celulitis.  Say sit is draining now.

## 2018-07-06 NOTE — ED Notes (Signed)
Pt states "I'm going to walk outside and smoke a cigarette." Pt offered nicotine patch. Pt states he's "given you 15 minutes and you still haven't brought one." Pt urged to remain in the building. Nicotine patch offered. Counseling given regarding possible dangers of smoking with the patch in place

## 2018-07-07 DIAGNOSIS — L03116 Cellulitis of left lower limb: Secondary | ICD-10-CM

## 2018-07-07 LAB — BASIC METABOLIC PANEL
Anion gap: 5 (ref 5–15)
BUN: 16 mg/dL (ref 8–23)
CO2: 30 mmol/L (ref 22–32)
Calcium: 8.5 mg/dL — ABNORMAL LOW (ref 8.9–10.3)
Chloride: 103 mmol/L (ref 98–111)
Creatinine, Ser: 1.04 mg/dL (ref 0.61–1.24)
GFR calc Af Amer: >60 mL/min (ref >60)
GFR calc non Af Amer: >60 mL/min (ref >60)
GLUCOSE: 125 mg/dL — AB (ref 70–99)
Potassium: 4 mmol/L (ref 3.5–5.1)
Sodium: 138 mmol/L (ref 135–145)

## 2018-07-07 LAB — CBC
HCT: 41.5 % (ref 39.0–52.0)
Hemoglobin: 13.6 g/dL (ref 13.0–17.0)
MCH: 31.5 pg (ref 26.0–34.0)
MCHC: 32.8 g/dL (ref 30.0–36.0)
MCV: 96.1 fL (ref 80.0–100.0)
Platelets: 269 10*3/uL (ref 150–400)
RBC: 4.32 MIL/uL (ref 4.22–5.81)
RDW: 12.3 % (ref 11.5–15.5)
WBC: 10.7 10*3/uL — ABNORMAL HIGH (ref 4.0–10.5)
nRBC: 0 % (ref 0.0–0.2)

## 2018-07-07 LAB — MRSA PCR SCREENING: MRSA by PCR: NEGATIVE

## 2018-07-07 MED ORDER — ENOXAPARIN SODIUM 40 MG/0.4ML ~~LOC~~ SOLN
40.0000 mg | Freq: Two times a day (BID) | SUBCUTANEOUS | Status: DC
  Administered 2018-07-07 – 2018-07-08 (×4): 40 mg via SUBCUTANEOUS
  Filled 2018-07-07 (×4): qty 0.4

## 2018-07-07 MED ORDER — SODIUM CHLORIDE 0.9 % IV SOLN
INTRAVENOUS | Status: DC | PRN
  Administered 2018-07-07: 50 mL via INTRAVENOUS
  Administered 2018-07-08: 100 mL via INTRAVENOUS
  Administered 2018-07-10 – 2018-07-12 (×2): 250 mL via INTRAVENOUS

## 2018-07-07 NOTE — Progress Notes (Signed)
[facility] SURGICAL PROGRESS NOTE (cpt [phone])  Hospital Day(s): 1.   Post op day(s):  [name]   Interval History: Patient seen and examined, no acute events or new complaints overnight. Patient reports improvement in the swelling, erythema, and pain in his left upper thigh. No drainage. Denied any fever, chills, nausea, or emesis. Very anxious to eat.   Review of Systems:  Constitutional: denies fever, chills  Integumentary: + Erythema to left upper thigh   Vital signs in last 24 hours: [min-max] current  Temp:  [97.7 F (36.5 C)-97.9 F (36.6 C)] 97.9 F (36.6 C) (02/14 0017) Pulse Rate:  [77-86] 85 (02/14 0017) Resp:  [20-28] 20 (02/14 0038) BP: (153-188)/(68-82) 153/76 (02/14 0017) SpO2:  [94 %-96 %] 94 % (02/14 0017) Weight:  [941 kg] 136 kg (02/13 1417)     Height: 5\' 9"  (175.3 cm) Weight: 136 kg BMI (Calculated): 44.26   Intake/Output this shift:  No intake/output data recorded.   Intake/Output last 2 shifts:  @IOLAST2SHIFTS @   Physical Exam:  Constitutional: alert, cooperative and no distress  HENT: normocephalic without obvious abnormality  Respiratory: breathing non-labored at rest  Integumentary: Erythema and induration to the left upper medial thigh, no area of fluctuance palpable. Warmth. No appreciable abscess   Labs:  CBC Latest Ref Rng & Units 07/07/2018 07/06/2018 07/01/2018  WBC 4.0 - 10.5 K/uL 10.7(H) 11.4(H) 20.5(H)  Hemoglobin 13.0 - 17.0 g/dL 13.6 13.8 14.6  Hematocrit 39.0 - 52.0 % 41.5 41.4 42.4  Platelets 150 - 400 K/uL 269 275 140(L)   CMP Latest Ref Rng & Units 07/07/2018 07/06/2018 07/01/2018  Glucose 70 - 99 mg/dL 125(H) 125(H) 211(H)  BUN 8 - 23 mg/dL 16 19 17   Creatinine 0.61 - 1.24 mg/dL 1.04 0.95 0.90  Sodium 135 - 145 mmol/L 138 136 134(L)  Potassium 3.5 - 5.1 mmol/L 4.0 4.0 3.7  Chloride 98 - 111 mmol/L 103 98 100  CO2 22 - 32 mmol/L 30 30 25   Calcium 8.9 - 10.3 mg/dL 8.5(L) 8.6(L) 8.3(L)  Total Protein 6.5 - 8.1 g/dL - 7.1  7.3  Total Bilirubin 0.3 - 1.2 mg/dL - 0.6 2.1(H)  Alkaline Phos 38 - 126 U/L - 76 61  AST 15 - 41 U/L - 38 21  ALT 0 - 44 U/L - 32 27     Imaging studies: No new pertinent imaging studies   Assessment/Plan: (ICD-10's: L03.116) 63 y.o. male with improved leukocytosis and erythema attributable to left upper thigh cellulitis without abscess, complicated by pertinent comorbidities including obesity and current tobacco abuse (smoking).   - Okay for diet   - Continue IV Abx (Clindamycin, Vancomycin, Inipenem)  - Pain control as needed  - Will continue to follow up to monitor erythema and swelling to left upper thigh.    - Medical management per primary team    All of the above findings and recommendations were discussed with the patient, and the medical team, and all of patient's questions were answered to their expressed satisfaction.  -- Corey Simon, PA-C [facility] 07/07/2018, 9:23 AM [phone] M-F: 7am - 4pm

## 2018-07-07 NOTE — Progress Notes (Signed)
Anticoagulation monitoring(Lovenox):  62yo  M ordered Lovenox 40 mg Q24h  Filed Weights   07/06/18 1417  Weight: 299 lb 13.2 oz (136 kg)   BMI 44   Lab Results  Component Value Date   CREATININE 1.04 07/07/2018   CREATININE 0.95 07/06/2018   CREATININE 0.90 07/01/2018   Estimated Creatinine Clearance: 102.1 mL/min (by C-G formula based on SCr of 1.04 mg/dL). Hemoglobin & Hematocrit     Component Value Date/Time   HGB 13.6 07/07/2018 0703   HCT 41.5 07/07/2018 0703     Per Protocol for Patient with estCrcl > 30 ml/min and BMI > 40, will transition to Lovenox 40 mgQ12h.     Chinita Greenland PharmD Clinical Pharmacist 07/07/2018

## 2018-07-07 NOTE — Progress Notes (Signed)
Noted Pt turned off IV pump, ABT did not infuse. Also noted Pt in using tobacco products and stated he will continue to use them.

## 2018-07-07 NOTE — Progress Notes (Signed)
[place] at [facility]                                                                                                                                                                                  Patient Demographics   Corey James, is a 63 y.o. male, DOB - [birth year], GNF:[id number]  Admit date - 07/06/2018   Admitting Physician Saundra Shelling, MD  Outpatient Primary MD for the patient is System, Provider Not In   LOS - 1  Subjective: Patient states that his left thigh feels better swelling is improved    Review of Systems:   CONSTITUTIONAL: No documented fever. No fatigue, weakness. No weight gain, no weight loss.  EYES: No blurry or double vision.  ENT: No tinnitus. No postnasal drip. No redness of the oropharynx.  RESPIRATORY: No cough, no wheeze, no hemoptysis. No dyspnea.  CARDIOVASCULAR: No chest pain. No orthopnea. No palpitations. No syncope.  GASTROINTESTINAL: No nausea, no vomiting or diarrhea. No abdominal pain. No melena or hematochezia.  GENITOURINARY: No dysuria or hematuria.  ENDOCRINE: No polyuria or nocturia. No heat or cold intolerance.  HEMATOLOGY: No anemia. No bruising. No bleeding.  INTEGUMENTARY: Left thigh redness MUSCULOSKELETAL: No arthritis. No swelling. No gout.  NEUROLOGIC: No numbness, tingling, or ataxia. No seizure-type activity.  PSYCHIATRIC: No anxiety. No insomnia. No ADD.    Vitals:   Vitals:   07/06/18 2116 07/06/18 2143 07/07/18 0017 07/07/18 0038  BP: (!) 188/82 (!) 164/73 (!) 153/76   Pulse: 82 77 85   Resp: 20 20 (!) 28 20  Temp:  97.7 F (36.5 C) 97.9 F (36.6 C)   TempSrc:  Oral Oral   SpO2: 95% 96% 94%   Weight:      Height:        Wt Readings from Last 3 Encounters:  07/06/18 136 kg  07/03/18 136.1 kg  05/05/18 (!) 140.6 kg     Intake/Output Summary (Last 24 hours) at 07/07/2018 1421 Last data filed at 07/06/2018 2337 Gross per 24 hour  Intake 650 ml  Output -  Net 650 ml     Physical Exam:   GENERAL: Pleasant-appearing in no apparent distress.  HEAD, EYES, EARS, NOSE AND THROAT: Atraumatic, normocephalic. Extraocular muscles are intact. Pupils equal and reactive to light. Sclerae anicteric. No conjunctival injection. No oro-pharyngeal erythema.  NECK: Supple. There is no jugular venous distention. No bruits, no lymphadenopathy, no thyromegaly.  HEART: Regular rate and rhythm,. No murmurs, no rubs, no clicks.  LUNGS: Clear to auscultation bilaterally. No rales or rhonchi. No wheezes.  ABDOMEN: Soft, flat, nontender, nondistended. Has good bowel sounds. No hepatosplenomegaly appreciated.  EXTREMITIES: Left thigh  is red and swollen NEUROLOGIC: The patient is alert, awake, and oriented x3 with no focal motor or sensory deficits appreciated bilaterally.  SKIN: Moist and warm with no rashes appreciated.  Psych: Not anxious, depressed LN: No inguinal LN enlargement    Antibiotics   Anti-infectives (From admission, onward)   Start     Dose/Rate Route Frequency Ordered Stop   07/07/18 0800  vancomycin (VANCOCIN) 1,250 mg in sodium chloride 0.9 % 250 mL IVPB     1,250 mg 166.7 mL/hr over 90 Minutes Intravenous Every 12 hours 07/06/18 2031     07/07/18 0000  clindamycin (CLEOCIN) IVPB 600 mg     600 mg 100 mL/hr over 30 Minutes Intravenous Every 6 hours 07/06/18 2245     07/06/18 2315  imipenem-cilastatin (PRIMAXIN) 500 mg in sodium chloride 0.9 % 100 mL IVPB     500 mg 200 mL/hr over 30 Minutes Intravenous Every 8 hours 07/06/18 2300     07/06/18 2130  metroNIDAZOLE (FLAGYL) IVPB 500 mg  Status:  Discontinued     500 mg 100 mL/hr over 60 Minutes Intravenous Every 8 hours 07/06/18 2029 07/06/18 2245   07/06/18 2030  vancomycin (VANCOCIN) 1,500 mg in sodium chloride 0.9 % 500 mL IVPB     1,500 mg 250 mL/hr over 120 Minutes Intravenous  Once 07/06/18 2026 07/07/18 0015   07/06/18 1530  vancomycin (VANCOCIN) IVPB 1000 mg/200 mL premix     1,000 mg 200 mL/hr  over 60 Minutes Intravenous  Once 07/06/18 1525 07/06/18 1810      Medications   Scheduled Meds: . enoxaparin (LOVENOX) injection  40 mg Subcutaneous Q12H  . [COMPLETED] nicotine  21 mg Transdermal Once  . nicotine  21 mg Transdermal Daily   Continuous Infusions: . sodium chloride 75 mL/hr at 07/06/18 2146  . clindamycin (CLEOCIN) IV 600 mg (07/07/18 6767)  . imipenem-cilastatin 500 mg (07/07/18 1145)  . vancomycin 1,250 mg (07/07/18 1355)   PRN Meds:.acetaminophen **OR** acetaminophen, HYDROcodone-acetaminophen, ondansetron **OR** ondansetron (ZOFRAN) IV, senna-docusate   Data Review:   Micro Results Recent Results (from the past 240 hour(s))  Blood culture (routine x 2)     Status: None (Preliminary result)   Collection Time: 07/06/18  4:45 PM  Result Value Ref Range Status   Specimen Description BLOOD LEFT HAND  Final   Special Requests   Final    BOTTLES DRAWN AEROBIC AND ANAEROBIC Blood Culture adequate volume   Culture   Final    NO GROWTH < 24 HOURS Performed at [facility], [address], [place], [place] [address]    Report Status PENDING  Incomplete  Blood culture (routine x 2)     Status: None (Preliminary result)   Collection Time: 07/06/18  4:46 PM  Result Value Ref Range Status   Specimen Description BLOOD LEFT ARM  Final   Special Requests   Final    BOTTLES DRAWN AEROBIC AND ANAEROBIC Blood Culture results may not be optimal due to an excessive volume of blood received in culture bottles   Culture   Final    NO GROWTH < 24 HOURS Performed at [facility], [address] [place], [place] [address]    Report Status PENDING  Incomplete  MRSA PCR Screening     Status: None   Collection Time: 07/07/18  6:35 AM  Result Value Ref Range Status   MRSA by PCR NEGATIVE NEGATIVE Final    Comment:        The  GeneXpert MRSA Assay (FDA approved for NASAL specimens only), is one component of a comprehensive MRSA colonization surveillance  program. It is not intended to diagnose MRSA infection nor to guide or monitor treatment for MRSA infections. Performed at [facility], [address] [place], [place] [address]     Radiology Reports Dg Chest 1 View  Result Date: 07/06/2018 CLINICAL DATA:  Preop.  Fever EXAM: CHEST  1 VIEW COMPARISON:  None. FINDINGS: Mild cardiomegaly. No confluent airspace opacities, effusions or edema. No acute bony abnormality. IMPRESSION: Mild cardiomegaly.  No active disease. Electronically Signed   By: Rolm Baptise M.D.   On: 07/06/2018 19:54   Ct Femur Left W Contrast  Result Date: 07/06/2018 CLINICAL DATA:  Left thigh abscess. EXAM: CT OF THE LOWER RIGHT EXTREMITY WITH CONTRAST TECHNIQUE: Multidetector CT imaging of the lower right extremity was performed according to the standard protocol following intravenous contrast administration. COMPARISON:  None. CONTRAST:  145mL OMNIPAQUE IOHEXOL 300 MG/ML  SOLN FINDINGS: No fracture or other acute bony abnormality is noted. No definite abnormality seen in the visualized portion of the pelvis. Mildly enlarged left inguinal lymph nodes are noted which most likely are inflammatory in etiology. Stranding of the subcutaneous tissues of the medial and anterior aspect of the proximal left thigh is noted most consistent with cellulitis. There are multiple irregular fluid collections seen in this area which potentially could represent possible developing abscess or abscesses. IMPRESSION: Findings consistent with cellulitis involving the subcutaneous tissues involving the medial and anterior aspect of the proximal left thigh. Multiple irregular low densities are noted within this area concerning for small fluid collections with ill-defined margins; potentially these may represent multiple developing abscesses. However, no definite wall enhancement is noted at this time. Electronically Signed   By: Marijo Conception, M.D.   On: 07/06/2018 20:11   US Venous Img  Lower Unilateral Left  Result Date: 07/06/2018 CLINICAL DATA:  63 year old male with left lower extremity redness, swelling, cellulitis and possible abscess. EXAM: LEFT LOWER EXTREMITY VENOUS DOPPLER ULTRASOUND TECHNIQUE: Gray-scale sonography with graded compression, as well as color Doppler and duplex ultrasound were performed to evaluate the lower extremity deep venous systems from the level of the common femoral vein and including the common femoral, femoral, profunda femoral, popliteal and calf veins including the posterior tibial, peroneal and gastrocnemius veins when visible. The superficial great saphenous vein was also interrogated. Spectral Doppler was utilized to evaluate flow at rest and with distal augmentation maneuvers in the common femoral, femoral and popliteal veins. COMPARISON:  None. FINDINGS: Contralateral Common Femoral Vein: Respiratory phasicity is normal and symmetric with the symptomatic side. No evidence of thrombus. Normal compressibility. Common Femoral Vein: No evidence of thrombus. Normal compressibility, respiratory phasicity and response to augmentation. Saphenofemoral Junction: No evidence of thrombus. Normal compressibility and flow on color Doppler imaging. Profunda Femoral Vein: No evidence of thrombus. Normal compressibility and flow on color Doppler imaging. Femoral Vein: No evidence of thrombus. Normal compressibility, respiratory phasicity and response to augmentation. Popliteal Vein: No evidence of thrombus. Normal compressibility, respiratory phasicity and response to augmentation. Calf Veins: No evidence of thrombus. Normal compressibility and flow on color Doppler imaging. Superficial Great Saphenous Vein: No evidence of thrombus. Normal compressibility. Venous Reflux:  None. Other Findings: Complex fluid collection in the medial upper thigh in the region of the inguinal crease measures approximately 11.5 x 3.0 by 7.8 cm. No evidence of internal vascularity on color  Doppler imaging to suggest a solid mass. IMPRESSION:  No evidence of deep venous thrombosis. Complex fluid collection in the medial upper thigh measures approximately 11.5 x 3.0 x 7.8 cm. Differential considerations include subcutaneous abscess versus hematoma. Electronically Signed   By: Jacqulynn Cadet M.D.   On: 07/06/2018 16:58     CBC Recent Labs  Lab 07/01/18 1218 07/06/18 1645 07/07/18 0703  WBC 20.5* 11.4* 10.7*  HGB 14.6 13.8 13.6  HCT 42.4 41.4 41.5  PLT 140* 275 269  MCV 92.4 93.2 96.1  MCH 31.8 31.1 31.5  MCHC 34.4 33.3 32.8  RDW 12.4 12.4 12.3  LYMPHSABS 0.8 1.2  --   MONOABS 1.2* 0.7  --   EOSABS 0.0 0.2  --   BASOSABS 0.1 0.1  --     Chemistries  Recent Labs  Lab 07/01/18 1218 07/06/18 1645 07/07/18 0703  NA 134* 136 138  K 3.7 4.0 4.0  CL 100 98 103  CO2 25 30 30   GLUCOSE 211* 125* 125*  BUN 17 19 16   CREATININE 0.90 0.95 1.04  CALCIUM 8.3* 8.6* 8.5*  AST 21 38  --   ALT 27 32  --   ALKPHOS 61 76  --   BILITOT 2.1* 0.6  --    ------------------------------------------------------------------------------------------------------------------ estimated creatinine clearance is 102.1 mL/min (by C-G formula based on SCr of 1.04 mg/dL). ------------------------------------------------------------------------------------------------------------------ No results for input(s): HGBA1C in the last 72 hours. ------------------------------------------------------------------------------------------------------------------ No results for input(s): CHOL, HDL, LDLCALC, TRIG, CHOLHDL, LDLDIRECT in the last 72 hours. ------------------------------------------------------------------------------------------------------------------ No results for input(s): TSH, T4TOTAL, T3FREE, THYROIDAB in the last 72 hours.  Invalid input(s): FREET3 ------------------------------------------------------------------------------------------------------------------ No results for  input(s): VITAMINB12, FOLATE, FERRITIN, TIBC, IRON, RETICCTPCT in the last 72 hours.  Coagulation profile No results for input(s): INR, PROTIME in the last 168 hours.  No results for input(s): DDIMER in the last 72 hours.  Cardiac Enzymes No results for input(s): CKMB, TROPONINI, MYOGLOBIN in the last 168 hours.  Invalid input(s): CK ------------------------------------------------------------------------------------------------------------------ Invalid input(s): POCBNP    Assessment & Plan   63 year old male patient with history of degenerative disc disease of lumbar spine currently presented to the emergency room for pain and swelling of the left thigh  -Left thigh abscess Continue IV antibiotic with vancomycin  -Left thigh pain secondary to abscess Control pain with oral narcotics and PRN IV morphine  -DVT prophylaxis Once hematoma ruled out with CT thigh Start subcu Lovenox 40 mg daily  -Tobacco abuse Tobacco cessation counseled Nicotine patch offered      Code Status Orders  (From admission, onward)         Start     Ordered   07/06/18 2130  Full code  Continuous     07/06/18 2132        Code Status History    This patient has a current code status but no historical code status.           Consults surgery  DVT Prophylaxis  Lovenox  Lab Results  Component Value Date   PLT 269 07/07/2018     Time Spent in minutes 83min Greater than 50% of time spent in care coordination and counseling patient regarding the condition and plan of care.   Dustin Flock M.D on 07/07/2018 at 2:21 PM  Between 7am to 6pm - Pager - [phone]  After 6pm go to [email or web address] - Proofreader  [employer]   Office  [phone]

## 2018-07-08 LAB — HIV ANTIBODY (ROUTINE TESTING W REFLEX): HIV Screen 4th Generation wRfx: NONREACTIVE

## 2018-07-08 NOTE — Plan of Care (Signed)
  Problem: Clinical Measurements: Goal: Ability to avoid or minimize complications of infection will improve Outcome: Progressing   Problem: Skin Integrity: Goal: Skin integrity will improve Outcome: Progressing   Problem: Health Behavior/Discharge Planning: Goal: Ability to manage health-related needs will improve Outcome: Progressing   Problem: Activity: Goal: Risk for activity intolerance will decrease Outcome: Progressing   Problem: Pain Managment: Goal: General experience of comfort will improve Outcome: Progressing   Problem: Safety: Goal: Ability to remain free from injury will improve Outcome: Progressing

## 2018-07-08 NOTE — Progress Notes (Addendum)
Pnt demanded he get a break from his IV being connected for a bit. Pnt is rather agitated this am. Let pnt know that we are finally caught up on antibiotics as yesterday he did not get what he was supposed to because of him complaining about the IV, educated on importance of antibiotics..   Will relay to day nurse the primaxin was moved from 7am to 8am to run with the clindamycin as they are compatible.

## 2018-07-08 NOTE — Progress Notes (Signed)
Subjective:  CC:  Corey James is a 63 y.o. male  Hospital stay day 2,   left leg cellulitis  HPI: States pain, swelling, and erythema is improving.  Asking to eat something  ROS:  General: Denies weight loss, weight gain, fatigue, fevers, chills, and night sweats. Heart: Denies chest pain, palpitations, racing heart, irregular heartbeat, leg pain or swelling, and decreased activity tolerance. Respiratory: Denies breathing difficulty, shortness of breath, wheezing, cough, and sputum. GI: Denies change in appetite, heartburn, nausea, vomiting, constipation, diarrhea, and blood in stool. GU: Denies difficulty urinating, pain with urinating, urgency, frequency, blood in urine.   Objective:      Temp:  [97.5 F (36.4 C)-97.8 F (36.6 C)] 97.8 F (36.6 C) (02/15 0809) Pulse Rate:  [76-87] 80 (02/15 0809) Resp:  [19-20] 20 (02/15 0809) BP: (136-145)/(70-111) 142/111 (02/15 0809) SpO2:  [97 %-100 %] 98 % (02/15 0809)     Height: 5\' 9"  (175.3 cm) Weight: 136 kg BMI (Calculated): 44.26   Intake/Output this shift:   Intake/Output Summary (Last 24 hours) at 07/08/2018 1036 Last data filed at 07/08/2018 0630 Gross per 24 hour  Intake 429.33 ml  Output 800 ml  Net -370.67 ml        Constitutional :  alert, cooperative, appears stated age and no distress  Respiratory:  clear to auscultation bilaterally  Cardiovascular:  regular rate and rhythm  Gastrointestinal: soft, non-tender; bowel sounds normal; no masses,  no organomegaly.   Skin: Cool and moist. Left inner thigh cellulitis present with increased swelling about size of my hand with surrounding erythema, evidence of resolving edema.  Minimal tenderness of area.  Psychiatric: Normal affect, non-agitated, not confused       LABS:  CMP Latest Ref Rng & Units 07/07/2018 07/06/2018 07/01/2018  Glucose 70 - 99 mg/dL 125(H) 125(H) 211(H)  BUN 8 - 23 mg/dL 16 19 17   Creatinine 0.61 - 1.24 mg/dL 1.04 0.95 0.90  Sodium 135 - 145 mmol/L  138 136 134(L)  Potassium 3.5 - 5.1 mmol/L 4.0 4.0 3.7  Chloride 98 - 111 mmol/L 103 98 100  CO2 22 - 32 mmol/L 30 30 25   Calcium 8.9 - 10.3 mg/dL 8.5(L) 8.6(L) 8.3(L)  Total Protein 6.5 - 8.1 g/dL - 7.1 7.3  Total Bilirubin 0.3 - 1.2 mg/dL - 0.6 2.1(H)  Alkaline Phos 38 - 126 U/L - 76 61  AST 15 - 41 U/L - 38 21  ALT 0 - 44 U/L - 32 27   CBC Latest Ref Rng & Units 07/07/2018 07/06/2018 07/01/2018  WBC 4.0 - 10.5 K/uL 10.7(H) 11.4(H) 20.5(H)  Hemoglobin 13.0 - 17.0 g/dL 13.6 13.8 14.6  Hematocrit 39.0 - 52.0 % 41.5 41.4 42.4  Platelets 150 - 400 K/uL 269 275 140(L)    RADS: CLINICAL DATA:  Left thigh abscess.  EXAM: CT OF THE LOWER RIGHT EXTREMITY WITH CONTRAST  TECHNIQUE: Multidetector CT imaging of the lower right extremity was performed according to the standard protocol following intravenous contrast administration.  COMPARISON:  None.  CONTRAST:  156mL OMNIPAQUE IOHEXOL 300 MG/ML  SOLN  FINDINGS: No fracture or other acute bony abnormality is noted. No definite abnormality seen in the visualized portion of the pelvis. Mildly enlarged left inguinal lymph nodes are noted which most likely are inflammatory in etiology. Stranding of the subcutaneous tissues of the medial and anterior aspect of the proximal left thigh is noted most consistent with cellulitis. There are multiple irregular fluid collections seen in this area which potentially could  represent possible developing abscess or abscesses.  IMPRESSION: Findings consistent with cellulitis involving the subcutaneous tissues involving the medial and anterior aspect of the proximal left thigh. Multiple irregular low densities are noted within this area concerning for small fluid collections with ill-defined margins; potentially these may represent multiple developing abscesses. However, no definite wall enhancement is noted at this time.   Electronically Signed   By: Marijo Conception, M.D.   On:  07/06/2018 20:11 Assessment:   Left thigh cellulitis.  Clinically improving per patient report and labs also downtrending, along with decreased erythema in area.  Still at very high change of forming organized abscess so will continue to monitor improvement.    Ok to resume diet, no need to keep NPO overnight.

## 2018-07-08 NOTE — Plan of Care (Signed)
  Problem: Clinical Measurements: Goal: Ability to avoid or minimize complications of infection will improve Outcome: Progressing   Problem: Skin Integrity: Goal: Skin integrity will improve Outcome: Progressing   Problem: Education: Goal: Knowledge of General Education information will improve Description Including pain rating scale, medication(s)/side effects and non-pharmacologic comfort measures Outcome: Progressing   Problem: Health Behavior/Discharge Planning: Goal: Ability to manage health-related needs will improve Outcome: Progressing   Problem: Activity: Goal: Risk for activity intolerance will decrease Outcome: Progressing   Problem: Pain Managment: Goal: General experience of comfort will improve Outcome: Progressing   Problem: Safety: Goal: Ability to remain free from injury will improve Outcome: Progressing

## 2018-07-08 NOTE — Progress Notes (Signed)
[place] [facility]                                                                                                                                                                                  Patient Demographics   Corey James, is a 63 y.o. male, DOB - Aug 20, 1955, DGL:[id number]  Admit date - 07/06/2018   Admitting Physician Saundra Shelling, MD  Outpatient Primary MD for the patient is System, Provider Not In   LOS - 2  Subjective: Patient states that "the hard area on his thigh has gotten smaller".  He also states that his pain has improved.  No fevers or chills.  Review of Systems:   CONSTITUTIONAL: No documented fever. No fatigue, weakness. No weight gain, no weight loss.  EYES: No blurry or double vision.  ENT: No tinnitus. No postnasal drip. No redness of the oropharynx.  RESPIRATORY: No cough, no wheeze, no hemoptysis. No dyspnea.  CARDIOVASCULAR: No chest pain. No orthopnea. No palpitations. No syncope.  GASTROINTESTINAL: No nausea, no vomiting or diarrhea. No abdominal pain. No melena or hematochezia.  GENITOURINARY: No dysuria or hematuria.  ENDOCRINE: No polyuria or nocturia. No heat or cold intolerance.  HEMATOLOGY: No anemia. No bruising. No bleeding.  INTEGUMENTARY: +Left thigh redness MUSCULOSKELETAL: No arthritis. No swelling. No gout.  NEUROLOGIC: No numbness, tingling, or ataxia. No seizure-type activity.  PSYCHIATRIC: No anxiety. No insomnia. No ADD.    Vitals:   Vitals:   07/07/18 0038 07/07/18 1800 07/07/18 2324 07/08/18 0809  BP:  136/71 (!) 145/70 (!) 142/111  Pulse:  76 87 80  Resp: 20 20 19 20   Temp:   (!) 97.5 F (36.4 C) 97.8 F (36.6 C)  TempSrc:   Oral Oral  SpO2:  97% 100% 98%  Weight:      Height:        Wt Readings from Last 3 Encounters:  07/06/18 136 kg  07/03/18 136.1 kg  05/05/18 (!) 140.6 kg     Intake/Output Summary (Last 24 hours) at 07/08/2018 1239 Last data filed at 07/08/2018 0630 Gross  per 24 hour  Intake 429.33 ml  Output 800 ml  Net -370.67 ml    Physical Exam:   GENERAL: Pleasant-appearing in no apparent distress.  HEENT: Atraumatic, normocephalic. Extraocular muscles are intact. Pupils equal and reactive to light. Sclerae anicteric. No conjunctival injection. No oro-pharyngeal erythema.  NECK: Supple. There is no jugular venous distention. No bruits, no lymphadenopathy, no thyromegaly.  HEART: Regular rate and rhythm,. No murmurs, no rubs, no clicks.  LUNGS: Clear to auscultation bilaterally. No rales or rhonchi. No wheezes.  ABDOMEN: Soft, flat, nontender, nondistended. Has good bowel  sounds. No hepatosplenomegaly appreciated.  EXTREMITIES: +Left thigh is red and swollen with a small area of induration NEUROLOGIC: The patient is alert, awake, and oriented x3 with no focal motor or sensory deficits appreciated bilaterally.  SKIN: Moist and warm with no rashes appreciated.  Psych: Not anxious, depressed   Antibiotics   Anti-infectives (From admission, onward)   Start     Dose/Rate Route Frequency Ordered Stop   07/07/18 0800  vancomycin (VANCOCIN) 1,250 mg in sodium chloride 0.9 % 250 mL IVPB     1,250 mg 166.7 mL/hr over 90 Minutes Intravenous Every 12 hours 07/06/18 2031     07/07/18 0000  clindamycin (CLEOCIN) IVPB 600 mg     600 mg 100 mL/hr over 30 Minutes Intravenous Every 6 hours 07/06/18 2245     07/06/18 2315  imipenem-cilastatin (PRIMAXIN) 500 mg in sodium chloride 0.9 % 100 mL IVPB     500 mg 200 mL/hr over 30 Minutes Intravenous Every 8 hours 07/06/18 2300     07/06/18 2130  metroNIDAZOLE (FLAGYL) IVPB 500 mg  Status:  Discontinued     500 mg 100 mL/hr over 60 Minutes Intravenous Every 8 hours 07/06/18 2029 07/06/18 2245   07/06/18 2030  vancomycin (VANCOCIN) 1,500 mg in sodium chloride 0.9 % 500 mL IVPB     1,500 mg 250 mL/hr over 120 Minutes Intravenous  Once 07/06/18 2026 07/07/18 0015   07/06/18 1530  vancomycin (VANCOCIN) IVPB 1000 mg/200  mL premix     1,000 mg 200 mL/hr over 60 Minutes Intravenous  Once 07/06/18 1525 07/06/18 1810      Medications   Scheduled Meds: . enoxaparin (LOVENOX) injection  40 mg Subcutaneous Q12H  . nicotine  21 mg Transdermal Daily   Continuous Infusions: . sodium chloride 100 mL (07/08/18 0223)  . clindamycin (CLEOCIN) IV 600 mg (07/08/18 0832)  . imipenem-cilastatin 500 mg (07/08/18 0831)  . vancomycin 1,250 mg (07/08/18 0224)   PRN Meds:.sodium chloride, acetaminophen **OR** acetaminophen, HYDROcodone-acetaminophen, ondansetron **OR** ondansetron (ZOFRAN) IV, senna-docusate   Data Review:   Micro Results Recent Results (from the past 240 hour(s))  Blood culture (routine x 2)     Status: None (Preliminary result)   Collection Time: 07/06/18  4:45 PM  Result Value Ref Range Status   Specimen Description BLOOD LEFT HAND  Final   Special Requests   Final    BOTTLES DRAWN AEROBIC AND ANAEROBIC Blood Culture adequate volume   Culture   Final    NO GROWTH 2 DAYS Performed at [facility], [address] [place], [place] [address]    Report Status PENDING  Incomplete  Blood culture (routine x 2)     Status: None (Preliminary result)   Collection Time: 07/06/18  4:46 PM  Result Value Ref Range Status   Specimen Description BLOOD LEFT ARM  Final   Special Requests   Final    BOTTLES DRAWN AEROBIC AND ANAEROBIC Blood Culture results may not be optimal due to an excessive volume of blood received in culture bottles   Culture   Final    NO GROWTH 2 DAYS Performed at [facility], [address] [place], [place] [address]    Report Status PENDING  Incomplete  MRSA PCR Screening     Status: None   Collection Time: 07/07/18  6:35 AM  Result Value Ref Range Status   MRSA by PCR NEGATIVE NEGATIVE Final    Comment:        The GeneXpert MRSA  Assay (FDA approved for NASAL specimens only), is one component of a comprehensive MRSA colonization surveillance program.  It is not intended to diagnose MRSA infection nor to guide or monitor treatment for MRSA infections. Performed at [facility], [address], [place], [place] [address]     Radiology Reports Dg Chest 1 View  Result Date: 07/06/2018 CLINICAL DATA:  Preop.  Fever EXAM: CHEST  1 VIEW COMPARISON:  None. FINDINGS: Mild cardiomegaly. No confluent airspace opacities, effusions or edema. No acute bony abnormality. IMPRESSION: Mild cardiomegaly.  No active disease. Electronically Signed   By: Rolm Baptise M.D.   On: 07/06/2018 19:54   Ct Femur Left W Contrast  Result Date: 07/06/2018 CLINICAL DATA:  Left thigh abscess. EXAM: CT OF THE LOWER RIGHT EXTREMITY WITH CONTRAST TECHNIQUE: Multidetector CT imaging of the lower right extremity was performed according to the standard protocol following intravenous contrast administration. COMPARISON:  None. CONTRAST:  179mL OMNIPAQUE IOHEXOL 300 MG/ML  SOLN FINDINGS: No fracture or other acute bony abnormality is noted. No definite abnormality seen in the visualized portion of the pelvis. Mildly enlarged left inguinal lymph nodes are noted which most likely are inflammatory in etiology. Stranding of the subcutaneous tissues of the medial and anterior aspect of the proximal left thigh is noted most consistent with cellulitis. There are multiple irregular fluid collections seen in this area which potentially could represent possible developing abscess or abscesses. IMPRESSION: Findings consistent with cellulitis involving the subcutaneous tissues involving the medial and anterior aspect of the proximal left thigh. Multiple irregular low densities are noted within this area concerning for small fluid collections with ill-defined margins; potentially these may represent multiple developing abscesses. However, no definite wall enhancement is noted at this time. Electronically Signed   By: Marijo Conception, M.D.   On: 07/06/2018 20:11   US Venous Img Lower  Unilateral Left  Result Date: 07/06/2018 CLINICAL DATA:  63 year old male with left lower extremity redness, swelling, cellulitis and possible abscess. EXAM: LEFT LOWER EXTREMITY VENOUS DOPPLER ULTRASOUND TECHNIQUE: Gray-scale sonography with graded compression, as well as color Doppler and duplex ultrasound were performed to evaluate the lower extremity deep venous systems from the level of the common femoral vein and including the common femoral, femoral, profunda femoral, popliteal and calf veins including the posterior tibial, peroneal and gastrocnemius veins when visible. The superficial great saphenous vein was also interrogated. Spectral Doppler was utilized to evaluate flow at rest and with distal augmentation maneuvers in the common femoral, femoral and popliteal veins. COMPARISON:  None. FINDINGS: Contralateral Common Femoral Vein: Respiratory phasicity is normal and symmetric with the symptomatic side. No evidence of thrombus. Normal compressibility. Common Femoral Vein: No evidence of thrombus. Normal compressibility, respiratory phasicity and response to augmentation. Saphenofemoral Junction: No evidence of thrombus. Normal compressibility and flow on color Doppler imaging. Profunda Femoral Vein: No evidence of thrombus. Normal compressibility and flow on color Doppler imaging. Femoral Vein: No evidence of thrombus. Normal compressibility, respiratory phasicity and response to augmentation. Popliteal Vein: No evidence of thrombus. Normal compressibility, respiratory phasicity and response to augmentation. Calf Veins: No evidence of thrombus. Normal compressibility and flow on color Doppler imaging. Superficial Great Saphenous Vein: No evidence of thrombus. Normal compressibility. Venous Reflux:  None. Other Findings: Complex fluid collection in the medial upper thigh in the region of the inguinal crease measures approximately 11.5 x 3.0 by 7.8 cm. No evidence of internal vascularity on color Doppler  imaging to suggest a solid mass. IMPRESSION: No evidence  of deep venous thrombosis. Complex fluid collection in the medial upper thigh measures approximately 11.5 x 3.0 x 7.8 cm. Differential considerations include subcutaneous abscess versus hematoma. Electronically Signed   By: Jacqulynn Cadet M.D.   On: 07/06/2018 16:58     CBC Recent Labs  Lab 07/06/18 1645 07/07/18 0703  WBC 11.4* 10.7*  HGB 13.8 13.6  HCT 41.4 41.5  PLT 275 269  MCV 93.2 96.1  MCH 31.1 31.5  MCHC 33.3 32.8  RDW 12.4 12.3  LYMPHSABS 1.2  --   MONOABS 0.7  --   EOSABS 0.2  --   BASOSABS 0.1  --     Chemistries  Recent Labs  Lab 07/06/18 1645 07/07/18 0703  NA 136 138  K 4.0 4.0  CL 98 103  CO2 30 30  GLUCOSE 125* 125*  BUN 19 16  CREATININE 0.95 1.04  CALCIUM 8.6* 8.5*  AST 38  --   ALT 32  --   ALKPHOS 76  --   BILITOT 0.6  --    ------------------------------------------------------------------------------------------------------------------ estimated creatinine clearance is 102.1 mL/min (by C-G formula based on SCr of 1.04 mg/dL). ------------------------------------------------------------------------------------------------------------------ No results for input(s): HGBA1C in the last 72 hours. ------------------------------------------------------------------------------------------------------------------ No results for input(s): CHOL, HDL, LDLCALC, TRIG, CHOLHDL, LDLDIRECT in the last 72 hours. ------------------------------------------------------------------------------------------------------------------ No results for input(s): TSH, T4TOTAL, T3FREE, THYROIDAB in the last 72 hours.  Invalid input(s): FREET3 ------------------------------------------------------------------------------------------------------------------ No results for input(s): VITAMINB12, FOLATE, FERRITIN, TIBC, IRON, RETICCTPCT in the last 72 hours.  Coagulation profile No results for input(s): INR, PROTIME in  the last 168 hours.  No results for input(s): DDIMER in the last 72 hours.  Cardiac Enzymes No results for input(s): CKMB, TROPONINI, MYOGLOBIN in the last 168 hours.  Invalid input(s): CK ------------------------------------------------------------------------------------------------------------------ Invalid input(s): POCBNP    Assessment & Plan   Left thigh abscess- clinically improving.  Leukocytosis has trended down and patient has been afebrile. -Continue vancomycin, clindamycin, primaxin -Surgery following -Pain control  Tobacco abuse -Tobacco cessation counseling performed this admission -Nicotine patch      Code Status Orders  (From admission, onward)         Start     Ordered   07/06/18 2130  Full code  Continuous     07/06/18 2132        Code Status History    This patient has a current code status but no historical code status.      Consults surgery  DVT Prophylaxis  Lovenox  Lab Results  Component Value Date   PLT 269 07/07/2018     Time Spent in minutes 67min Greater than 50% of time spent in care coordination and counseling patient regarding the condition and plan of care.   Berna Spare Mayo M.D on 07/08/2018 at 12:39 PM  Between 7am to 6pm - Pager - [phone]  After 6pm go to [email or web address] - Proofreader  [employer]   Office  [phone]

## 2018-07-09 LAB — CBC
HCT: 39.4 % (ref 39.0–52.0)
Hemoglobin: 12.7 g/dL — ABNORMAL LOW (ref 13.0–17.0)
MCH: 31.1 pg (ref 26.0–34.0)
MCHC: 32.2 g/dL (ref 30.0–36.0)
MCV: 96.3 fL (ref 80.0–100.0)
NRBC: 0 % (ref 0.0–0.2)
PLATELETS: 220 10*3/uL (ref 150–400)
RBC: 4.09 MIL/uL — AB (ref 4.22–5.81)
RDW: 12.4 % (ref 11.5–15.5)
WBC: 11.6 10*3/uL — ABNORMAL HIGH (ref 4.0–10.5)

## 2018-07-09 MED ORDER — SODIUM CHLORIDE 0.9 % IV SOLN
500.0000 mg | Freq: Four times a day (QID) | INTRAVENOUS | Status: DC
  Administered 2018-07-09 – 2018-07-10 (×4): 500 mg via INTRAVENOUS
  Filled 2018-07-09 (×8): qty 500

## 2018-07-09 NOTE — Progress Notes (Signed)
Subjective:  CC:  Corey James is a 63 y.o. male  Hospital stay day 3,   left leg cellulitis  HPI: States pain, swelling, and erythema continuing to improve.   ROS:  General: Denies weight loss, weight gain, fatigue, fevers, chills, and night sweats. Heart: Denies chest pain, palpitations, racing heart, irregular heartbeat, leg pain or swelling, and decreased activity tolerance. Respiratory: Denies breathing difficulty, shortness of breath, wheezing, cough, and sputum. GI: Denies change in appetite, heartburn, nausea, vomiting, constipation, diarrhea, and blood in stool. GU: Denies difficulty urinating, pain with urinating, urgency, frequency, blood in urine.   Objective:      Temp:  [97.9 F (36.6 C)-98.5 F (36.9 C)] 97.9 F (36.6 C) (02/16 0833) Pulse Rate:  [75-84] 75 (02/16 0833) Resp:  [19-20] 19 (02/16 0833) BP: (140-150)/(64-89) 150/89 (02/16 0833) SpO2:  [95 %-98 %] 95 % (02/16 0833)     Height: 5\' 9"  (175.3 cm) Weight: 136 kg BMI (Calculated): 44.26   Intake/Output this shift:   Intake/Output Summary (Last 24 hours) at 07/09/2018 0951 Last data filed at 07/09/2018 0735 Gross per 24 hour  Intake 1343.91 ml  Output -  Net 1343.91 ml        Constitutional :  alert, cooperative, appears stated age and no distress  Respiratory:  clear to auscultation bilaterally  Cardiovascular:  regular rate and rhythm  Gastrointestinal: soft, non-tender; bowel sounds normal; no masses,  no organomegaly.   Skin: Cool and moist. Erythema improved again from yesterday, with induration softer and smaller as well.  Some superficial skin excoriation noted but no obvious drainage.    Psychiatric: Normal affect, non-agitated, not confused       LABS:  CMP Latest Ref Rng & Units 07/07/2018 07/06/2018 07/01/2018  Glucose 70 - 99 mg/dL 125(H) 125(H) 211(H)  BUN 8 - 23 mg/dL 16 19 17   Creatinine 0.61 - 1.24 mg/dL 1.04 0.95 0.90  Sodium 135 - 145 mmol/L 138 136 134(L)  Potassium 3.5 - 5.1  mmol/L 4.0 4.0 3.7  Chloride 98 - 111 mmol/L 103 98 100  CO2 22 - 32 mmol/L 30 30 25   Calcium 8.9 - 10.3 mg/dL 8.5(L) 8.6(L) 8.3(L)  Total Protein 6.5 - 8.1 g/dL - 7.1 7.3  Total Bilirubin 0.3 - 1.2 mg/dL - 0.6 2.1(H)  Alkaline Phos 38 - 126 U/L - 76 61  AST 15 - 41 U/L - 38 21  ALT 0 - 44 U/L - 32 27   CBC Latest Ref Rng & Units 07/09/2018 07/07/2018 07/06/2018  WBC 4.0 - 10.5 K/uL 11.6(H) 10.7(H) 11.4(H)  Hemoglobin 13.0 - 17.0 g/dL 12.7(L) 13.6 13.8  Hematocrit 39.0 - 52.0 % 39.4 41.5 41.4  Platelets 150 - 400 K/uL 220 269 275    RADS: n/a Assessment:   Left thigh cellulitis.  Clinically improving per patient report and physical exam reassuring, but wbc up again.    With clinical improvement, will wait another day before considering I&D since post operative wound care maybe a challenge for patient based on location of wound.  Keep NPO overnight in case I&D needs to happen tomorrow.     Ok to resume diet, no need to keep NPO overnight.

## 2018-07-09 NOTE — Progress Notes (Signed)
Pharmacy Antibiotic Note  Corey James is a 63 y.o. male admitted on 07/06/2018 with wound infection.  Pharmacy has been consulted for imipenem-cilastatin dosing. He is noted to be clinically improving but WBC have bumped up a small amount today.  Plan: Will increase dose of imipenem-cilastatin to 500 mg IV q6h  Height: 5\' 9"  (175.3 cm) Weight: 299 lb 13.2 oz (136 kg) IBW/kg (Calculated) : 70.7  Temp (24hrs), Avg:98.3 F (36.8 C), Min:97.9 F (36.6 C), Max:98.5 F (36.9 C)  Recent Labs  Lab 07/06/18 1645 07/06/18 2134 07/07/18 0703 07/09/18 0544  WBC 11.4*  --  10.7* 11.6*  CREATININE 0.95  --  1.04  --   LATICACIDVEN 1.6 1.0  --   --     Estimated Creatinine Clearance: 102.1 mL/min (by C-G formula based on SCr of 1.04 mg/dL).    Allergies  Allergen Reactions  . Penicillins Itching and Rash    Thank you for allowing pharmacy to be a part of this patient's care.  Vallery Sa, PharmD Clinical Pharmacist 07/09/2018

## 2018-07-09 NOTE — Progress Notes (Addendum)
[facility]                                                                                                                                                                                  Patient Demographics   Corey James, is a 63 y.o. male, DOB - July 02, 1955, VOH:[id number]  Admit date - 07/06/2018   Admitting Physician Saundra Shelling, MD  Outpatient Primary MD for the patient is System, Provider Not In   LOS - 3  Subjective: Patient feels like his skin infection continues to improve.  He is worried that it will get worse again at home.  No fevers or chills.  No nausea or vomiting.  Review of Systems:   CONSTITUTIONAL: No documented fever. No fatigue, weakness. No weight gain, no weight loss.  EYES: No blurry or double vision.  ENT: No tinnitus. No postnasal drip. No redness of the oropharynx.  RESPIRATORY: No cough, no wheeze, no hemoptysis. No dyspnea.  CARDIOVASCULAR: No chest pain. No orthopnea. No palpitations. No syncope.  GASTROINTESTINAL: No nausea, no vomiting or diarrhea. No abdominal pain. No melena or hematochezia.  GENITOURINARY: No dysuria or hematuria.  ENDOCRINE: No polyuria or nocturia. No heat or cold intolerance.  HEMATOLOGY: No anemia. No bruising. No bleeding.  INTEGUMENTARY: +Left thigh redness MUSCULOSKELETAL: No arthritis. No swelling. No gout.  NEUROLOGIC: No numbness, tingling, or ataxia. No seizure-type activity.  PSYCHIATRIC: No anxiety. No insomnia. No ADD.    Vitals:   Vitals:   07/08/18 0809 07/08/18 1648 07/08/18 2322 07/09/18 0833  BP: (!) 142/111 (!) 141/64 140/70 (!) 150/89  Pulse: 80 79 84 75  Resp: 20  20 19   Temp: 97.8 F (36.6 C) 98.5 F (36.9 C) 98.5 F (36.9 C) 97.9 F (36.6 C)  TempSrc: Oral Oral  Oral  SpO2: 98% 98% 96% 95%  Weight:      Height:        Wt Readings from Last 3 Encounters:  07/06/18 136 kg  07/03/18 136.1 kg  05/05/18 (!) 140.6 kg     Intake/Output Summary (Last  24 hours) at 07/09/2018 1350 Last data filed at 07/09/2018 1014 Gross per 24 hour  Intake 1487.62 ml  Output -  Net 1487.62 ml    Physical Exam:   GENERAL: Pleasant-appearing in no apparent distress.  HEENT: Atraumatic, normocephalic. Extraocular muscles are intact. Pupils equal and reactive to light. Sclerae anicteric. No conjunctival injection. No oro-pharyngeal erythema.  NECK: Supple. There is no jugular venous distention. No bruits, no lymphadenopathy, no thyromegaly.  HEART: Regular rate and rhythm,. No murmurs, no rubs, no clicks.  LUNGS: Clear to auscultation bilaterally. No rales or rhonchi. No  wheezes.  ABDOMEN: Soft, flat, nontender, nondistended. Has good bowel sounds. No hepatosplenomegaly appreciated.  EXTREMITIES: +Left posterior thigh is red and swollen with a small area of induration, +superficial skin breakdown with some dried blood NEUROLOGIC: The patient is alert, awake, and oriented x3 with no focal motor or sensory deficits appreciated bilaterally.  SKIN: Moist and warm with no rashes appreciated.  Psych: Not anxious, depressed   Antibiotics   Anti-infectives (From admission, onward)   Start     Dose/Rate Route Frequency Ordered Stop   07/07/18 0800  vancomycin (VANCOCIN) 1,250 mg in sodium chloride 0.9 % 250 mL IVPB     1,250 mg 166.7 mL/hr over 90 Minutes Intravenous Every 12 hours 07/06/18 2031     07/07/18 0000  clindamycin (CLEOCIN) IVPB 600 mg     600 mg 100 mL/hr over 30 Minutes Intravenous Every 6 hours 07/06/18 2245     07/06/18 2315  imipenem-cilastatin (PRIMAXIN) 500 mg in sodium chloride 0.9 % 100 mL IVPB     500 mg 200 mL/hr over 30 Minutes Intravenous Every 8 hours 07/06/18 2300     07/06/18 2130  metroNIDAZOLE (FLAGYL) IVPB 500 mg  Status:  Discontinued     500 mg 100 mL/hr over 60 Minutes Intravenous Every 8 hours 07/06/18 2029 07/06/18 2245   07/06/18 2030  vancomycin (VANCOCIN) 1,500 mg in sodium chloride 0.9 % 500 mL IVPB     1,500 mg 250  mL/hr over 120 Minutes Intravenous  Once 07/06/18 2026 07/07/18 0015   07/06/18 1530  vancomycin (VANCOCIN) IVPB 1000 mg/200 mL premix     1,000 mg 200 mL/hr over 60 Minutes Intravenous  Once 07/06/18 1525 07/06/18 1810      Medications   Scheduled Meds: . nicotine  21 mg Transdermal Daily   Continuous Infusions: . sodium chloride Stopped (07/08/18 0831)  . clindamycin (CLEOCIN) IV Stopped (07/09/18 0829)  . imipenem-cilastatin Stopped (07/09/18 7096)  . vancomycin Stopped (07/09/18 0306)   PRN Meds:.sodium chloride, acetaminophen **OR** acetaminophen, HYDROcodone-acetaminophen, ondansetron **OR** ondansetron (ZOFRAN) IV, senna-docusate   Data Review:   Micro Results Recent Results (from the past 240 hour(s))  Blood culture (routine x 2)     Status: None (Preliminary result)   Collection Time: 07/06/18  4:45 PM  Result Value Ref Range Status   Specimen Description BLOOD LEFT HAND  Final   Special Requests   Final    BOTTLES DRAWN AEROBIC AND ANAEROBIC Blood Culture adequate volume   Culture   Final    NO GROWTH 3 DAYS Performed at [facility], [address] [place], [place] [address]    Report Status PENDING  Incomplete  Blood culture (routine x 2)     Status: None (Preliminary result)   Collection Time: 07/06/18  4:46 PM  Result Value Ref Range Status   Specimen Description BLOOD LEFT ARM  Final   Special Requests   Final    BOTTLES DRAWN AEROBIC AND ANAEROBIC Blood Culture results may not be optimal due to an excessive volume of blood received in culture bottles   Culture   Final    NO GROWTH 3 DAYS Performed at [facility], [address] [place], [place] [address]    Report Status PENDING  Incomplete  MRSA PCR Screening     Status: None   Collection Time: 07/07/18  6:35 AM  Result Value Ref Range Status   MRSA by PCR NEGATIVE NEGATIVE Final    Comment:  The GeneXpert MRSA Assay (FDA approved for NASAL specimens only), is one  component of a comprehensive MRSA colonization surveillance program. It is not intended to diagnose MRSA infection nor to guide or monitor treatment for MRSA infections. Performed at [facility], [address] [place], [place] [address]     Radiology Reports Dg Chest 1 View  Result Date: 07/06/2018 CLINICAL DATA:  Preop.  Fever EXAM: CHEST  1 VIEW COMPARISON:  None. FINDINGS: Mild cardiomegaly. No confluent airspace opacities, effusions or edema. No acute bony abnormality. IMPRESSION: Mild cardiomegaly.  No active disease. Electronically Signed   By: Rolm Baptise M.D.   On: 07/06/2018 19:54   Ct Femur Left W Contrast  Result Date: 07/06/2018 CLINICAL DATA:  Left thigh abscess. EXAM: CT OF THE LOWER RIGHT EXTREMITY WITH CONTRAST TECHNIQUE: Multidetector CT imaging of the lower right extremity was performed according to the standard protocol following intravenous contrast administration. COMPARISON:  None. CONTRAST:  165mL OMNIPAQUE IOHEXOL 300 MG/ML  SOLN FINDINGS: No fracture or other acute bony abnormality is noted. No definite abnormality seen in the visualized portion of the pelvis. Mildly enlarged left inguinal lymph nodes are noted which most likely are inflammatory in etiology. Stranding of the subcutaneous tissues of the medial and anterior aspect of the proximal left thigh is noted most consistent with cellulitis. There are multiple irregular fluid collections seen in this area which potentially could represent possible developing abscess or abscesses. IMPRESSION: Findings consistent with cellulitis involving the subcutaneous tissues involving the medial and anterior aspect of the proximal left thigh. Multiple irregular low densities are noted within this area concerning for small fluid collections with ill-defined margins; potentially these may represent multiple developing abscesses. However, no definite wall enhancement is noted at this time. Electronically Signed   By:  Marijo Conception, M.D.   On: 07/06/2018 20:11   US Venous Img Lower Unilateral Left  Result Date: 07/06/2018 CLINICAL DATA:  63 year old male with left lower extremity redness, swelling, cellulitis and possible abscess. EXAM: LEFT LOWER EXTREMITY VENOUS DOPPLER ULTRASOUND TECHNIQUE: Gray-scale sonography with graded compression, as well as color Doppler and duplex ultrasound were performed to evaluate the lower extremity deep venous systems from the level of the common femoral vein and including the common femoral, femoral, profunda femoral, popliteal and calf veins including the posterior tibial, peroneal and gastrocnemius veins when visible. The superficial great saphenous vein was also interrogated. Spectral Doppler was utilized to evaluate flow at rest and with distal augmentation maneuvers in the common femoral, femoral and popliteal veins. COMPARISON:  None. FINDINGS: Contralateral Common Femoral Vein: Respiratory phasicity is normal and symmetric with the symptomatic side. No evidence of thrombus. Normal compressibility. Common Femoral Vein: No evidence of thrombus. Normal compressibility, respiratory phasicity and response to augmentation. Saphenofemoral Junction: No evidence of thrombus. Normal compressibility and flow on color Doppler imaging. Profunda Femoral Vein: No evidence of thrombus. Normal compressibility and flow on color Doppler imaging. Femoral Vein: No evidence of thrombus. Normal compressibility, respiratory phasicity and response to augmentation. Popliteal Vein: No evidence of thrombus. Normal compressibility, respiratory phasicity and response to augmentation. Calf Veins: No evidence of thrombus. Normal compressibility and flow on color Doppler imaging. Superficial Great Saphenous Vein: No evidence of thrombus. Normal compressibility. Venous Reflux:  None. Other Findings: Complex fluid collection in the medial upper thigh in the region of the inguinal crease measures approximately 11.5 x  3.0 by 7.8 cm. No evidence of internal vascularity on color Doppler imaging to suggest a solid mass.  IMPRESSION: No evidence of deep venous thrombosis. Complex fluid collection in the medial upper thigh measures approximately 11.5 x 3.0 x 7.8 cm. Differential considerations include subcutaneous abscess versus hematoma. Electronically Signed   By: Jacqulynn Cadet M.D.   On: 07/06/2018 16:58     CBC Recent Labs  Lab 07/06/18 1645 07/07/18 0703 07/09/18 0544  WBC 11.4* 10.7* 11.6*  HGB 13.8 13.6 12.7*  HCT 41.4 41.5 39.4  PLT 275 269 220  MCV 93.2 96.1 96.3  MCH 31.1 31.5 31.1  MCHC 33.3 32.8 32.2  RDW 12.4 12.3 12.4  LYMPHSABS 1.2  --   --   MONOABS 0.7  --   --   EOSABS 0.2  --   --   BASOSABS 0.1  --   --     Chemistries  Recent Labs  Lab 07/06/18 1645 07/07/18 0703  NA 136 138  K 4.0 4.0  CL 98 103  CO2 30 30  GLUCOSE 125* 125*  BUN 19 16  CREATININE 0.95 1.04  CALCIUM 8.6* 8.5*  AST 38  --   ALT 32  --   ALKPHOS 76  --   BILITOT 0.6  --    ------------------------------------------------------------------------------------------------------------------ estimated creatinine clearance is 102.1 mL/min (by C-G formula based on SCr of 1.04 mg/dL). ------------------------------------------------------------------------------------------------------------------ No results for input(s): HGBA1C in the last 72 hours. ------------------------------------------------------------------------------------------------------------------ No results for input(s): CHOL, HDL, LDLCALC, TRIG, CHOLHDL, LDLDIRECT in the last 72 hours. ------------------------------------------------------------------------------------------------------------------ No results for input(s): TSH, T4TOTAL, T3FREE, THYROIDAB in the last 72 hours.  Invalid input(s): FREET3 ------------------------------------------------------------------------------------------------------------------ No results for  input(s): VITAMINB12, FOLATE, FERRITIN, TIBC, IRON, RETICCTPCT in the last 72 hours.  Coagulation profile No results for input(s): INR, PROTIME in the last 168 hours.  No results for input(s): DDIMER in the last 72 hours.  Cardiac Enzymes No results for input(s): CKMB, TROPONINI, MYOGLOBIN in the last 168 hours.  Invalid input(s): CK ------------------------------------------------------------------------------------------------------------------ Invalid input(s): POCBNP    Assessment & Plan   Left thigh abscess- clinically improving.  Has been afebrile.  WBC bumped from 10.7 > 11.6 -Continue vancomycin, clindamycin, primaxin -Surgery following- possible I&D tomorrow, will make n.p.o. at midnight -Pain control  History of hepatitis C- patient states he has been diagnosed with hepatitis C in the past.  Has never been treated.  Unable to find any confirmatory labs in the medical record. -Will order hep C labs -If hep C positive, will need follow-up with GI as an outpatient  Tobacco abuse -Tobacco cessation counseling performed this admission -Nicotine patch      Code Status Orders  (From admission, onward)         Start     Ordered   07/06/18 2130  Full code  Continuous     07/06/18 2132        Code Status History    This patient has a current code status but no historical code status.      Consults surgery  DVT Prophylaxis  Lovenox  Lab Results  Component Value Date   PLT 220 07/09/2018     Time Spent in minutes 33 min Greater than 50% of time spent in care coordination and counseling patient regarding the condition and plan of care.   Berna Spare Korey Arroyo M.D on 07/09/2018 at 1:50 PM  Between 7am to 6pm - Pager - [phone]  After 6pm go to [email or web address] - Proofreader  [employer]   Office  [phone]

## 2018-07-09 NOTE — Plan of Care (Signed)
  Problem: Clinical Measurements: Goal: Ability to avoid or minimize complications of infection will improve Outcome: Progressing   Problem: Skin Integrity: Goal: Skin integrity will improve Outcome: Progressing   Problem: Health Behavior/Discharge Planning: Goal: Ability to manage health-related needs will improve Outcome: Progressing   Problem: Activity: Goal: Risk for activity intolerance will decrease Outcome: Progressing   Problem: Pain Managment: Goal: General experience of comfort will improve Outcome: Progressing   Problem: Safety: Goal: Ability to remain free from injury will improve Outcome: Progressing

## 2018-07-10 ENCOUNTER — Inpatient Hospital Stay: Payer: Medicaid Other

## 2018-07-10 LAB — CBC
HCT: 41.8 % (ref 39.0–52.0)
Hemoglobin: 13.6 g/dL (ref 13.0–17.0)
MCH: 31.5 pg (ref 26.0–34.0)
MCHC: 32.5 g/dL (ref 30.0–36.0)
MCV: 96.8 fL (ref 80.0–100.0)
Platelets: 235 10*3/uL (ref 150–400)
RBC: 4.32 MIL/uL (ref 4.22–5.81)
RDW: 12.3 % (ref 11.5–15.5)
WBC: 11.5 10*3/uL — ABNORMAL HIGH (ref 4.0–10.5)
nRBC: 0 % (ref 0.0–0.2)

## 2018-07-10 MED ORDER — IOHEXOL 300 MG/ML  SOLN
100.0000 mL | Freq: Once | INTRAMUSCULAR | Status: AC | PRN
  Administered 2018-07-10: 100 mL via INTRAVENOUS

## 2018-07-10 MED ORDER — SODIUM CHLORIDE 0.9 % IV SOLN
2.0000 g | INTRAVENOUS | Status: DC
  Administered 2018-07-10 – 2018-07-14 (×5): 2 g via INTRAVENOUS
  Filled 2018-07-10: qty 20
  Filled 2018-07-10 (×4): qty 2
  Filled 2018-07-10: qty 20

## 2018-07-10 NOTE — Care Management (Signed)
This patient requested a letter to be faxed to his attorney showing he is in the hospital admitted.  Letter was faxed to [phone] per request and placed on the chart.

## 2018-07-10 NOTE — Progress Notes (Signed)
[facility]                                                                                                                                                                                  Patient Demographics   Corey James, is a 63 y.o. male, DOB - 06/10/1955, YHC:[id number]  Admit date - 07/06/2018   Admitting Physician Saundra Shelling, MD  Outpatient Primary MD for the patient is System, Provider Not In   LOS - 4  Subjective: States that his swelling is getting worse today.  He feels like his thigh is "tighter".  No fevers or chills.  No drainage.  Review of Systems:   CONSTITUTIONAL: No documented fever. No fatigue, weakness. No weight gain, no weight loss.  EYES: No blurry or double vision.  ENT: No tinnitus. No postnasal drip. No redness of the oropharynx.  RESPIRATORY: No cough, no wheeze, no hemoptysis. No dyspnea.  CARDIOVASCULAR: No chest pain. No orthopnea. No palpitations. No syncope.  GASTROINTESTINAL: No nausea, no vomiting or diarrhea. No abdominal pain. No melena or hematochezia.  GENITOURINARY: No dysuria or hematuria.  ENDOCRINE: No polyuria or nocturia. No heat or cold intolerance.  HEMATOLOGY: No anemia. No bruising. No bleeding.  INTEGUMENTARY: +Left thigh redness MUSCULOSKELETAL: No arthritis. No swelling. No gout.  NEUROLOGIC: No numbness, tingling, or ataxia. No seizure-type activity.  PSYCHIATRIC: No anxiety. No insomnia. No ADD.    Vitals:   Vitals:   07/09/18 0833 07/09/18 1628 07/10/18 0001 07/10/18 0756  BP: (!) 150/89 (!) 115/54 (!) 123/93 (!) 144/76  Pulse: 75 75 75 80  Resp: 19 16  20   Temp: 97.9 F (36.6 C) 98.3 F (36.8 C)  98.3 F (36.8 C)  TempSrc: Oral Oral  Oral  SpO2: 95% 99% 98% 96%  Weight:      Height:        Wt Readings from Last 3 Encounters:  07/06/18 136 kg  07/03/18 136.1 kg  05/05/18 (!) 140.6 kg     Intake/Output Summary (Last 24 hours) at 07/10/2018 1104 Last data filed at  07/09/2018 1821 Gross per 24 hour  Intake 445.62 ml  Output -  Net 445.62 ml    Physical Exam:   GENERAL: Pleasant-appearing in no apparent distress.  HEENT: Atraumatic, normocephalic. Extraocular muscles are intact. Pupils equal and reactive to light. Sclerae anicteric. No conjunctival injection. No oro-pharyngeal erythema.  NECK: Supple. There is no jugular venous distention. No bruits, no lymphadenopathy, no thyromegaly.  HEART: Regular rate and rhythm,. No murmurs, no rubs, no clicks.  LUNGS: Clear to auscultation bilaterally. No rales or rhonchi. No wheezes.  ABDOMEN: Soft, flat, nontender, nondistended. Has good  bowel sounds. No hepatosplenomegaly appreciated.  EXTREMITIES: +Left posterior thigh is red and swollen with a large area of induration, +superficial skin breakdown with some dried blood NEUROLOGIC: The patient is alert, awake, and oriented x3 with no focal motor or sensory deficits appreciated bilaterally.  SKIN: Moist and warm with no rashes appreciated.  Psych: Not anxious, depressed   Antibiotics   Anti-infectives (From admission, onward)   Start     Dose/Rate Route Frequency Ordered Stop   07/09/18 1800  imipenem-cilastatin (PRIMAXIN) 500 mg in sodium chloride 0.9 % 100 mL IVPB     500 mg 200 mL/hr over 30 Minutes Intravenous Every 6 hours 07/09/18 1509     07/07/18 0800  vancomycin (VANCOCIN) 1,250 mg in sodium chloride 0.9 % 250 mL IVPB     1,250 mg 166.7 mL/hr over 90 Minutes Intravenous Every 12 hours 07/06/18 2031     07/07/18 0000  clindamycin (CLEOCIN) IVPB 600 mg     600 mg 100 mL/hr over 30 Minutes Intravenous Every 6 hours 07/06/18 2245     07/06/18 2315  imipenem-cilastatin (PRIMAXIN) 500 mg in sodium chloride 0.9 % 100 mL IVPB  Status:  Discontinued     500 mg 200 mL/hr over 30 Minutes Intravenous Every 8 hours 07/06/18 2300 07/09/18 1509   07/06/18 2130  metroNIDAZOLE (FLAGYL) IVPB 500 mg  Status:  Discontinued     500 mg 100 mL/hr over 60 Minutes  Intravenous Every 8 hours 07/06/18 2029 07/06/18 2245   07/06/18 2030  vancomycin (VANCOCIN) 1,500 mg in sodium chloride 0.9 % 500 mL IVPB     1,500 mg 250 mL/hr over 120 Minutes Intravenous  Once 07/06/18 2026 07/07/18 0015   07/06/18 1530  vancomycin (VANCOCIN) IVPB 1000 mg/200 mL premix     1,000 mg 200 mL/hr over 60 Minutes Intravenous  Once 07/06/18 1525 07/06/18 1810      Medications   Scheduled Meds: . nicotine  21 mg Transdermal Daily   Continuous Infusions: . sodium chloride Stopped (07/08/18 0831)  . clindamycin (CLEOCIN) IV 600 mg (07/10/18 0825)  . imipenem-cilastatin 500 mg (07/10/18 0542)  . vancomycin 1,250 mg (07/10/18 0122)   PRN Meds:.sodium chloride, acetaminophen **OR** acetaminophen, HYDROcodone-acetaminophen, ondansetron **OR** ondansetron (ZOFRAN) IV, senna-docusate   Data Review:   Micro Results Recent Results (from the past 240 hour(s))  Blood culture (routine x 2)     Status: None (Preliminary result)   Collection Time: 07/06/18  4:45 PM  Result Value Ref Range Status   Specimen Description BLOOD LEFT HAND  Final   Special Requests   Final    BOTTLES DRAWN AEROBIC AND ANAEROBIC Blood Culture adequate volume   Culture   Final    NO GROWTH 4 DAYS Performed at [facility], [address], [place], [place] [address]    Report Status PENDING  Incomplete  Blood culture (routine x 2)     Status: None (Preliminary result)   Collection Time: 07/06/18  4:46 PM  Result Value Ref Range Status   Specimen Description BLOOD LEFT ARM  Final   Special Requests   Final    BOTTLES DRAWN AEROBIC AND ANAEROBIC Blood Culture results may not be optimal due to an excessive volume of blood received in culture bottles   Culture   Final    NO GROWTH 4 DAYS Performed at [facility], [address] [place], [place] [address]    Report Status PENDING  Incomplete  MRSA PCR Screening  Status: None   Collection Time: 07/07/18  6:35 AM  Result  Value Ref Range Status   MRSA by PCR NEGATIVE NEGATIVE Final    Comment:        The GeneXpert MRSA Assay (FDA approved for NASAL specimens only), is one component of a comprehensive MRSA colonization surveillance program. It is not intended to diagnose MRSA infection nor to guide or monitor treatment for MRSA infections. Performed at [facility], [address] [place], [place] [address]     Radiology Reports Dg Chest 1 View  Result Date: 07/06/2018 CLINICAL DATA:  Preop.  Fever EXAM: CHEST  1 VIEW COMPARISON:  None. FINDINGS: Mild cardiomegaly. No confluent airspace opacities, effusions or edema. No acute bony abnormality. IMPRESSION: Mild cardiomegaly.  No active disease. Electronically Signed   By: Rolm Baptise M.D.   On: 07/06/2018 19:54   Ct Femur Left W Contrast  Result Date: 07/06/2018 CLINICAL DATA:  Left thigh abscess. EXAM: CT OF THE LOWER RIGHT EXTREMITY WITH CONTRAST TECHNIQUE: Multidetector CT imaging of the lower right extremity was performed according to the standard protocol following intravenous contrast administration. COMPARISON:  None. CONTRAST:  183mL OMNIPAQUE IOHEXOL 300 MG/ML  SOLN FINDINGS: No fracture or other acute bony abnormality is noted. No definite abnormality seen in the visualized portion of the pelvis. Mildly enlarged left inguinal lymph nodes are noted which most likely are inflammatory in etiology. Stranding of the subcutaneous tissues of the medial and anterior aspect of the proximal left thigh is noted most consistent with cellulitis. There are multiple irregular fluid collections seen in this area which potentially could represent possible developing abscess or abscesses. IMPRESSION: Findings consistent with cellulitis involving the subcutaneous tissues involving the medial and anterior aspect of the proximal left thigh. Multiple irregular low densities are noted within this area concerning for small fluid collections with ill-defined  margins; potentially these may represent multiple developing abscesses. However, no definite wall enhancement is noted at this time. Electronically Signed   By: Marijo Conception, M.D.   On: 07/06/2018 20:11   US Venous Img Lower Unilateral Left  Result Date: 07/06/2018 CLINICAL DATA:  63 year old male with left lower extremity redness, swelling, cellulitis and possible abscess. EXAM: LEFT LOWER EXTREMITY VENOUS DOPPLER ULTRASOUND TECHNIQUE: Gray-scale sonography with graded compression, as well as color Doppler and duplex ultrasound were performed to evaluate the lower extremity deep venous systems from the level of the common femoral vein and including the common femoral, femoral, profunda femoral, popliteal and calf veins including the posterior tibial, peroneal and gastrocnemius veins when visible. The superficial great saphenous vein was also interrogated. Spectral Doppler was utilized to evaluate flow at rest and with distal augmentation maneuvers in the common femoral, femoral and popliteal veins. COMPARISON:  None. FINDINGS: Contralateral Common Femoral Vein: Respiratory phasicity is normal and symmetric with the symptomatic side. No evidence of thrombus. Normal compressibility. Common Femoral Vein: No evidence of thrombus. Normal compressibility, respiratory phasicity and response to augmentation. Saphenofemoral Junction: No evidence of thrombus. Normal compressibility and flow on color Doppler imaging. Profunda Femoral Vein: No evidence of thrombus. Normal compressibility and flow on color Doppler imaging. Femoral Vein: No evidence of thrombus. Normal compressibility, respiratory phasicity and response to augmentation. Popliteal Vein: No evidence of thrombus. Normal compressibility, respiratory phasicity and response to augmentation. Calf Veins: No evidence of thrombus. Normal compressibility and flow on color Doppler imaging. Superficial Great Saphenous Vein: No evidence of thrombus. Normal  compressibility. Venous Reflux:  None. Other Findings: Complex fluid  collection in the medial upper thigh in the region of the inguinal crease measures approximately 11.5 x 3.0 by 7.8 cm. No evidence of internal vascularity on color Doppler imaging to suggest a solid mass. IMPRESSION: No evidence of deep venous thrombosis. Complex fluid collection in the medial upper thigh measures approximately 11.5 x 3.0 x 7.8 cm. Differential considerations include subcutaneous abscess versus hematoma. Electronically Signed   By: Jacqulynn Cadet M.D.   On: 07/06/2018 16:58     CBC Recent Labs  Lab 07/06/18 1645 07/07/18 0703 07/09/18 0544 07/10/18 0350  WBC 11.4* 10.7* 11.6* 11.5*  HGB 13.8 13.6 12.7* 13.6  HCT 41.4 41.5 39.4 41.8  PLT 275 269 220 235  MCV 93.2 96.1 96.3 96.8  MCH 31.1 31.5 31.1 31.5  MCHC 33.3 32.8 32.2 32.5  RDW 12.4 12.3 12.4 12.3  LYMPHSABS 1.2  --   --   --   MONOABS 0.7  --   --   --   EOSABS 0.2  --   --   --   BASOSABS 0.1  --   --   --     Chemistries  Recent Labs  Lab 07/06/18 1645 07/07/18 0703  NA 136 138  K 4.0 4.0  CL 98 103  CO2 30 30  GLUCOSE 125* 125*  BUN 19 16  CREATININE 0.95 1.04  CALCIUM 8.6* 8.5*  AST 38  --   ALT 32  --   ALKPHOS 76  --   BILITOT 0.6  --    ------------------------------------------------------------------------------------------------------------------ estimated creatinine clearance is 102.1 mL/min (by C-G formula based on SCr of 1.04 mg/dL). ------------------------------------------------------------------------------------------------------------------ No results for input(s): HGBA1C in the last 72 hours. ------------------------------------------------------------------------------------------------------------------ No results for input(s): CHOL, HDL, LDLCALC, TRIG, CHOLHDL, LDLDIRECT in the last 72  hours. ------------------------------------------------------------------------------------------------------------------ No results for input(s): TSH, T4TOTAL, T3FREE, THYROIDAB in the last 72 hours.  Invalid input(s): FREET3 ------------------------------------------------------------------------------------------------------------------ No results for input(s): VITAMINB12, FOLATE, FERRITIN, TIBC, IRON, RETICCTPCT in the last 72 hours.  Coagulation profile No results for input(s): INR, PROTIME in the last 168 hours.  No results for input(s): DDIMER in the last 72 hours.  Cardiac Enzymes No results for input(s): CKMB, TROPONINI, MYOGLOBIN in the last 168 hours.  Invalid input(s): CK ------------------------------------------------------------------------------------------------------------------ Invalid input(s): POCBNP    Assessment & Plan   Left thigh abscess- clinically appears a bit worse today.  Has been afebrile.  WBC remains mildly elevated -Continue vancomycin, clindamycin, primaxin -Surgery following- plan for CT today, may be taken to the OR -Pain control  History of hepatitis C- patient states he has been diagnosed with hepatitis C in the past.  Has never been treated.  Unable to find any confirmatory labs in the medical record. -Hep C antibody pending -If hep C positive, will need follow-up with GI as an outpatient  Tobacco abuse -Tobacco cessation counseling performed this admission -Nicotine patch      Code Status Orders  (From admission, onward)         Start     Ordered   07/06/18 2130  Full code  Continuous     07/06/18 2132        Code Status History    This patient has a current code status but no historical code status.      Consults surgery  DVT Prophylaxis  Lovenox  Lab Results  Component Value Date   PLT 235 07/10/2018   Time Spent in minutes 35 min Greater than 50% of time spent in care coordination  and counseling patient  regarding the condition and plan of care.   Berna Spare Elisama Thissen M.D on 07/10/2018 at 11:04 AM  Between 7am to 6pm - Pager - [phone]  After 6pm go to [email or web address] - Proofreader  [employer]   Office  [phone]

## 2018-07-10 NOTE — Progress Notes (Signed)
   07/10/18 1100  Clinical Encounter Type  Visited With Patient not available;Health care provider  Visit Type Initial   Chaplain received a recommendation to talk with this patient from his nurse. Patient unavailable and sleeping soundly. Will attempt a visit later.

## 2018-07-10 NOTE — Progress Notes (Signed)
[facility] SURGICAL PROGRESS NOTE (cpt [phone])  Hospital Day(s): 4.   Post op day(s):  [name]   Interval History: Patient seen and examined, no acute events or new complaints overnight. Patient reports that he feels that the area near his inner upper left thigh is becoming continually more swollen and painful. He denied any fever, chills, nausea or emesis. No active drainage. Pain is interfering with mobility.   Review of Systems:  Constitutional: denies fever, chills  Respiratory: denies any shortness of breath  Cardiovascular: denies chest pain or palpitations  Gastrointestinal: denies N/V, or diarrhea/and bowel function as per interval history Integumentary: + Left thigh cellulitis    Vital signs in last 24 hours: [min-max] current  Temp:  [98.3 F (36.8 C)] 98.3 F (36.8 C) (02/17 0756) Pulse Rate:  [75-80] 80 (02/17 0756) Resp:  [16-20] 20 (02/17 0756) BP: (115-144)/(54-93) 144/76 (02/17 0756) SpO2:  [96 %-99 %] 96 % (02/17 0756)     Height: 5\' 9"  (175.3 cm) Weight: 136 kg BMI (Calculated): 44.26   Intake/Output this shift:  No intake/output data recorded.   Intake/Output last 2 shifts:  @IOLAST2SHIFTS @   Physical Exam:  Constitutional: alert, cooperative and no distress  HENT: normocephalic without obvious abnormality  Eyes: PERRL, EOM's grossly intact and symmetric  Respiratory: breathing non-labored at rest  Integumentary: Erythema and induration to the left upper medial thigh, no gross area of fluctuance palpable. Warm to touch.     Labs:  CBC Latest Ref Rng & Units 07/10/2018 07/09/2018 07/07/2018  WBC 4.0 - 10.5 K/uL 11.5(H) 11.6(H) 10.7(H)  Hemoglobin 13.0 - 17.0 g/dL 13.6 12.7(L) 13.6  Hematocrit 39.0 - 52.0 % 41.8 39.4 41.5  Platelets 150 - 400 K/uL 235 220 269   CMP Latest Ref Rng & Units 07/07/2018 07/06/2018 07/01/2018  Glucose 70 - 99 mg/dL 125(H) 125(H) 211(H)  BUN 8 - 23 mg/dL 16 19 17   Creatinine 0.61 - 1.24 mg/dL 1.04 0.95 0.90  Sodium 135  - 145 mmol/L 138 136 134(L)  Potassium 3.5 - 5.1 mmol/L 4.0 4.0 3.7  Chloride 98 - 111 mmol/L 103 98 100  CO2 22 - 32 mmol/L 30 30 25   Calcium 8.9 - 10.3 mg/dL 8.5(L) 8.6(L) 8.3(L)  Total Protein 6.5 - 8.1 g/dL - 7.1 7.3  Total Bilirubin 0.3 - 1.2 mg/dL - 0.6 2.1(H)  Alkaline Phos 38 - 126 U/L - 76 61  AST 15 - 41 U/L - 38 21  ALT 0 - 44 U/L - 32 27     Imaging studies: No new pertinent imaging studies   Assessment/Plan: (ICD-10's: L03.116) 63 y.o. male with improved leukocytosis but still with an area or blanchable erythema and induration without gross evidence of fluctuance attributable to left thigh cellulitis with question/concer for abscess, complicated by pertinent comorbidities including obesity and current tobacco abuse (smoking)..   - NPO for now   - Will obtain CT to reassess for possible abscess vs continued induration.   - No emergent surgical intervention, will continue to reassess following CT scan  - Continue to monitor leukocytosis  - Pain control, IV ABx (Clindamycin, Vancomycin, Inipenem)   - Medical management per primary team.    All of the above findings and recommendations were discussed with the patient, and the medical team, and all of patient's questions were answered to his expressed satisfaction.  -- Edison Simon, PA-C [facility] 07/10/2018, 8:54 AM [phone] M-F: 7am - 4pm

## 2018-07-10 NOTE — Progress Notes (Signed)
CT scan personally reviewed. D/W pt findings. Given the persistent cellulitis and soft tissue infection I think he is better serve with formal I/D. D/W him that we would be able to do it today but it would be late tonight. Pt adamant about wanting to eat and wishes to have I/d done tomorrow. We will put it on the schedule tomorrow for formal I/D

## 2018-07-10 NOTE — Progress Notes (Signed)
   07/10/18 1400  Clinical Encounter Type  Visited With Patient  Visit Type Follow-up  Referral From Nurse  Stress Factors  Patient Stress Factors Health changes   OR received to talk with the patient. Upon arrival to the patient's room, another hospital staff person was concluding a visit. The patient was lying in bed, awake and alert. We talked briefly about what brought him to the hospital and his upcoming surgical procedure. He indicated frustration with the frequency of lab draws and the timing of that lab work (I.e. during the night). The patient shared that he has difficulty being patient and is now working to filter what he shares about his feelings/experiences. This won't change how he's feeling, but it does change how he's perceived. He indicates that he doesn't "do anything" to help with his frustrations in life, but notes that he is able to talk with his girlfriend about life and what's on his mind. He receives support from that relationship. During the conversation, the patient apologized for the timing, but wanted/needed to take a nap. Chaplain will follow-up at a later time.

## 2018-07-10 NOTE — Anesthesia Preprocedure Evaluation (Addendum)
Anesthesia Evaluation  Patient identified by MRN, date of birth, ID band Patient awake    Reviewed: Allergy & Precautions, H&P , NPO status , Patient's Chart, lab work & pertinent test results  Airway Mallampati: III       Dental   Pulmonary neg pulmonary ROS, Current Smoker,    Pulmonary exam normal        Cardiovascular Exercise Tolerance: Good negative cardio ROS Normal cardiovascular exam     Neuro/Psych negative neurological ROS  negative psych ROS   GI/Hepatic negative GI ROS, Neg liver ROS,   Endo/Other  negative endocrine ROS  Renal/GU negative Renal ROS  negative genitourinary   Musculoskeletal  (+) Arthritis , Osteoarthritis,    Abdominal Normal abdominal exam  (+)   Peds negative pediatric ROS (+)  Hematology negative hematology ROS (+)   Anesthesia Other Findings   Reproductive/Obstetrics negative OB ROS                            Anesthesia Physical Anesthesia Plan  ASA: III  Anesthesia Plan: General and General ETT   Post-op Pain Management:    Induction: Intravenous  PONV Risk Score and Plan:   Airway Management Planned: Oral ETT  Additional Equipment:   Intra-op Plan:   Post-operative Plan: Extubation in OR  Informed Consent: I have reviewed the patients History and Physical, chart, labs and discussed the procedure including the risks, benefits and alternatives for the proposed anesthesia with the patient or authorized representative who has indicated his/her understanding and acceptance.       Plan Discussed with:   Anesthesia Plan Comments:        Anesthesia Quick Evaluation

## 2018-07-10 NOTE — Plan of Care (Signed)
Patient got self dressed, in [facility] and wheeled self off floor to visitors exit.  Floor Director and I met patient in hall and asked him to return to the floor. Education given, but insisted that he's leaving the floor for now. Also, stated that the weekend staff has been letting him go.  Patient probably just smoking, but don't really know??  Dr. Text to inform.

## 2018-07-11 ENCOUNTER — Inpatient Hospital Stay: Payer: Medicaid Other | Admitting: Anesthesiology

## 2018-07-11 ENCOUNTER — Encounter: Payer: Self-pay | Admitting: Anesthesiology

## 2018-07-11 ENCOUNTER — Encounter
Admission: EM | Disposition: A | Discharge status: Home / Self Care | Payer: Self-pay | Source: Home / Self Care | Attending: Internal Medicine

## 2018-07-11 DIAGNOSIS — M7989 Other specified soft tissue disorders: Secondary | ICD-10-CM

## 2018-07-11 HISTORY — PX: INCISION AND DRAINAGE ABSCESS: SHX5864

## 2018-07-11 LAB — CBC
HCT: 41.1 % (ref 39.0–52.0)
Hemoglobin: 13.3 g/dL (ref 13.0–17.0)
MCH: 31.1 pg (ref 26.0–34.0)
MCHC: 32.4 g/dL (ref 30.0–36.0)
MCV: 96.3 fL (ref 80.0–100.0)
NRBC: 0 % (ref 0.0–0.2)
Platelets: 256 10*3/uL (ref 150–400)
RBC: 4.27 MIL/uL (ref 4.22–5.81)
RDW: 12.5 % (ref 11.5–15.5)
WBC: 12.7 10*3/uL — ABNORMAL HIGH (ref 4.0–10.5)

## 2018-07-11 LAB — CULTURE, BLOOD (ROUTINE X 2)
Culture: NO GROWTH
Culture: NO GROWTH
SPECIAL REQUESTS: ADEQUATE

## 2018-07-11 LAB — CREATININE, SERUM
Creatinine, Ser: 0.92 mg/dL (ref 0.61–1.24)
GFR calc Af Amer: >60 mL/min (ref >60)
GFR calc non Af Amer: >60 mL/min (ref >60)

## 2018-07-11 LAB — HEPATITIS C ANTIBODY (REFLEX): HCV Ab: >11 s/co ratio — ABNORMAL HIGH (ref 0.0–0.9)

## 2018-07-11 LAB — COMMENT2 - HEP PANEL

## 2018-07-11 SURGERY — INCISION AND DRAINAGE, ABSCESS
Anesthesia: General | Site: Thigh | Laterality: Left

## 2018-07-11 MED ORDER — FENTANYL CITRATE (PF) 100 MCG/2ML IJ SOLN
INTRAMUSCULAR | Status: AC
  Filled 2018-07-11: qty 2

## 2018-07-11 MED ORDER — DEXAMETHASONE SODIUM PHOSPHATE 10 MG/ML IJ SOLN
INTRAMUSCULAR | Status: DC | PRN
  Administered 2018-07-11: 10 mg via INTRAVENOUS

## 2018-07-11 MED ORDER — FENTANYL CITRATE (PF) 100 MCG/2ML IJ SOLN
INTRAMUSCULAR | Status: AC
  Administered 2018-07-11: 25 ug via INTRAVENOUS
  Filled 2018-07-11: qty 2

## 2018-07-11 MED ORDER — LORAZEPAM 1 MG PO TABS
1.0000 mg | ORAL_TABLET | Freq: Four times a day (QID) | ORAL | Status: DC | PRN
  Administered 2018-07-11 – 2018-07-13 (×7): 1 mg via ORAL
  Filled 2018-07-11 (×7): qty 1

## 2018-07-11 MED ORDER — SUCCINYLCHOLINE CHLORIDE 20 MG/ML IJ SOLN
INTRAMUSCULAR | Status: DC | PRN
  Administered 2018-07-11: 100 mg via INTRAVENOUS

## 2018-07-11 MED ORDER — PROPOFOL 10 MG/ML IV BOLUS
INTRAVENOUS | Status: AC
  Filled 2018-07-11: qty 20

## 2018-07-11 MED ORDER — MIDAZOLAM HCL 2 MG/2ML IJ SOLN
INTRAMUSCULAR | Status: DC | PRN
  Administered 2018-07-11: 2 mg via INTRAVENOUS

## 2018-07-11 MED ORDER — FENTANYL CITRATE (PF) 100 MCG/2ML IJ SOLN
25.0000 ug | INTRAMUSCULAR | Status: AC | PRN
  Administered 2018-07-11 (×6): 25 ug via INTRAVENOUS

## 2018-07-11 MED ORDER — ONDANSETRON HCL 4 MG/2ML IJ SOLN
INTRAMUSCULAR | Status: DC | PRN
  Administered 2018-07-11: 4 mg via INTRAVENOUS

## 2018-07-11 MED ORDER — MORPHINE SULFATE (PF) 4 MG/ML IV SOLN
4.0000 mg | INTRAVENOUS | Status: DC | PRN
  Administered 2018-07-12 (×3): 4 mg via INTRAVENOUS
  Filled 2018-07-11 (×3): qty 1

## 2018-07-11 MED ORDER — ONDANSETRON HCL 4 MG/2ML IJ SOLN: 4.0000 mg | Freq: Once | INTRAMUSCULAR | Status: DC | PRN

## 2018-07-11 MED ORDER — ROCURONIUM 10MG/ML (10ML) SYRINGE FOR MEDFUSION PUMP - OPTIME
INTRAVENOUS | Status: DC | PRN
  Administered 2018-07-11: 10 mg via INTRAVENOUS

## 2018-07-11 MED ORDER — LIDOCAINE HCL (CARDIAC) PF 100 MG/5ML IV SOSY
PREFILLED_SYRINGE | INTRAVENOUS | Status: DC | PRN
  Administered 2018-07-11: 100 mg via INTRAVENOUS

## 2018-07-11 MED ORDER — ALBUMIN HUMAN 5 % IV SOLN
INTRAVENOUS | Status: DC | PRN
  Administered 2018-07-11: 15:00:00 via INTRAVENOUS

## 2018-07-11 MED ORDER — FENTANYL CITRATE (PF) 100 MCG/2ML IJ SOLN
INTRAMUSCULAR | Status: DC | PRN
  Administered 2018-07-11 (×2): 50 ug via INTRAVENOUS

## 2018-07-11 MED ORDER — MIDAZOLAM HCL 2 MG/2ML IJ SOLN
INTRAMUSCULAR | Status: AC
  Filled 2018-07-11: qty 2

## 2018-07-11 MED ORDER — ALBUMIN HUMAN 5 % IV SOLN
INTRAVENOUS | Status: AC
  Filled 2018-07-11: qty 250

## 2018-07-11 MED ORDER — LACTATED RINGERS IV SOLN
INTRAVENOUS | Status: DC
  Administered 2018-07-11 (×2): via INTRAVENOUS

## 2018-07-11 MED ORDER — PROPOFOL 10 MG/ML IV BOLUS
INTRAVENOUS | Status: DC | PRN
  Administered 2018-07-11: 200 mg via INTRAVENOUS

## 2018-07-11 SURGICAL SUPPLY — 23 items
ADHESIVE MASTISOL STRL (MISCELLANEOUS) ×6 IMPLANT
BLADE SURG 15 STRL LF DISP TIS (BLADE) ×1 IMPLANT
BLADE SURG 15 STRL SS (BLADE) ×2
CANISTER SUCT 3000ML PPV (MISCELLANEOUS) ×3 IMPLANT
CHLORAPREP W/TINT 26ML (MISCELLANEOUS) ×3 IMPLANT
COVER WAND RF STERILE (DRAPES) ×3 IMPLANT
DRAPE LAPAROTOMY 77X122 PED (DRAPES) ×3 IMPLANT
DRSG VAC ATS MED SENSATRAC (GAUZE/BANDAGES/DRESSINGS) ×3 IMPLANT
ELECT CAUTERY BLADE 6.4 (BLADE) ×3 IMPLANT
ELECT REM PT RETURN 9FT ADLT (ELECTROSURGICAL) ×3
ELECTRODE REM PT RTRN 9FT ADLT (ELECTROSURGICAL) ×1 IMPLANT
GLOVE BIO SURGEON STRL SZ7 (GLOVE) ×21 IMPLANT
GOWN STRL REUS W/ TWL LRG LVL3 (GOWN DISPOSABLE) ×5 IMPLANT
GOWN STRL REUS W/TWL LRG LVL3 (GOWN DISPOSABLE) ×10
NS IRRIG 1000ML POUR BTL (IV SOLUTION) ×3 IMPLANT
PACK BASIN MINOR ARMC (MISCELLANEOUS) ×3 IMPLANT
PULSAVAC PLUS IRRIG FAN TIP (DISPOSABLE) ×3
SPONGE LAP 18X18 RF (DISPOSABLE) ×12 IMPLANT
SUT SILK 2 0 (SUTURE) ×2
SUT SILK 2 0SH CR/8 30 (SUTURE) ×3 IMPLANT
SUT SILK 2-0 18XBRD TIE 12 (SUTURE) ×1 IMPLANT
TIP FAN IRRIG PULSAVAC PLUS (DISPOSABLE) ×1 IMPLANT
WND VAC CANISTER 500ML (MISCELLANEOUS) ×3 IMPLANT

## 2018-07-11 NOTE — Progress Notes (Signed)
   07/11/18 1226  Clinical Encounter Type  Visited With Patient not available;Health care provider  Visit Type Follow-up  Referral From Nurse   Referral received from Unit Director re: patient's anxiety around upcoming surgery. Patient sleeping upon arrival to room. Will follow up at a later time.

## 2018-07-11 NOTE — Anesthesia Post-op Follow-up Note (Signed)
Anesthesia QCDR form completed.        

## 2018-07-11 NOTE — Progress Notes (Signed)
Patient resting in bed. Wife at bedside. No complaints at this time. Continue to monitor.

## 2018-07-11 NOTE — Progress Notes (Signed)
Preoperative Review   Patient is met in the preoperative holding area. The history is reviewed in the chart and with the patient. I personally reviewed the options and rationale as well as the risks of this procedure that have been previously discussed with the patient. All questions asked by the patient and/or family were answered to their satisfaction.  Patient agrees to proceed with this procedure at this time.  Diego Pabon M.D. FACS   

## 2018-07-11 NOTE — Progress Notes (Signed)
[place] at [facility]                                                                                                                                                                                  Patient Demographics   Corey James, is a 63 y.o. male, DOB - 1955/10/04, CHE:[id number]  Admit date - 07/06/2018   Admitting Physician Saundra Shelling, MD  Outpatient Primary MD for the patient is System, Provider Not In   LOS - 5  Subjective: Patient states that he feels like he is getting worse again today.  He is anxious to have surgery, but knows that it may be the only thing to help with infection.  No fevers or chills.  Review of Systems:   CONSTITUTIONAL: No documented fever. No fatigue, weakness. No weight gain, no weight loss.  EYES: No blurry or double vision.  ENT: No tinnitus. No postnasal drip. No redness of the oropharynx.  RESPIRATORY: No cough, no wheeze, no hemoptysis. No dyspnea.  CARDIOVASCULAR: No chest pain. No orthopnea. No palpitations. No syncope.  GASTROINTESTINAL: No nausea, no vomiting or diarrhea. No abdominal pain. No melena or hematochezia.  GENITOURINARY: No dysuria or hematuria.  ENDOCRINE: No polyuria or nocturia. No heat or cold intolerance.  HEMATOLOGY: No anemia. No bruising. No bleeding.  INTEGUMENTARY: +Left thigh redness MUSCULOSKELETAL: No arthritis. No swelling. No gout.  NEUROLOGIC: No numbness, tingling, or ataxia. No seizure-type activity.  PSYCHIATRIC: +anxiety. No insomnia. No ADD.    Vitals:   Vitals:   07/10/18 0001 07/10/18 0756 07/10/18 2308 07/11/18 1250  BP: (!) 123/93 (!) 144/76 134/72 (!) 141/77  Pulse: 75 80 81 81  Resp:  20 19 16   Temp:  98.3 F (36.8 C) 98.2 F (36.8 C) (!) 97.5 F (36.4 C)  TempSrc:  Oral Oral Tympanic  SpO2: 98% 96% 95%   Weight:      Height:        Wt Readings from Last 3 Encounters:  07/06/18 136 kg  07/03/18 136.1 kg  05/05/18 (!) 140.6 kg     Intake/Output  Summary (Last 24 hours) at 07/11/2018 1319 Last data filed at 07/11/2018 1238 Gross per 24 hour  Intake 1294.16 ml  Output 600 ml  Net 694.16 ml    Physical Exam:   GENERAL: Pleasant-appearing in no apparent distress.  HEENT: Atraumatic, normocephalic. Extraocular muscles are intact. Pupils equal and reactive to light. Sclerae anicteric. No conjunctival injection. No oro-pharyngeal erythema.  NECK: Supple. There is no jugular venous distention. No bruits, no lymphadenopathy, no thyromegaly.  HEART: Regular rate and rhythm,. No murmurs, no rubs, no clicks.  LUNGS: Clear to auscultation bilaterally. No  rales or rhonchi. No wheezes.  ABDOMEN: Soft, flat, nontender, nondistended. Has good bowel sounds. No hepatosplenomegaly appreciated.  EXTREMITIES: +Left posterior thigh is red and swollen with a large area of induration, +superficial skin breakdown with some dried blood NEUROLOGIC: The patient is alert, awake, and oriented x3 with no focal motor or sensory deficits appreciated bilaterally.  SKIN: Moist and warm with no rashes appreciated.  Psych: Not anxious, depressed   Antibiotics   Anti-infectives (From admission, onward)   Start     Dose/Rate Route Frequency Ordered Stop   07/10/18 1800  [MAR Hold]  cefTRIAXone (ROCEPHIN) 2 g in sodium chloride 0.9 % 100 mL IVPB     (MAR Hold since Tue 07/11/2018 at 1247. Reason: Transfer to a Procedural area.)   2 g 200 mL/hr over 30 Minutes Intravenous Every 24 hours 07/10/18 1556     07/09/18 1800  imipenem-cilastatin (PRIMAXIN) 500 mg in sodium chloride 0.9 % 100 mL IVPB  Status:  Discontinued     500 mg 200 mL/hr over 30 Minutes Intravenous Every 6 hours 07/09/18 1509 07/10/18 1556   07/07/18 0800  [MAR Hold]  vancomycin (VANCOCIN) 1,250 mg in sodium chloride 0.9 % 250 mL IVPB     (MAR Hold since Tue 07/11/2018 at 1246. Reason: Transfer to a Procedural area.)   1,250 mg 166.7 mL/hr over 90 Minutes Intravenous Every 12 hours 07/06/18 2031      07/07/18 0000  [MAR Hold]  clindamycin (CLEOCIN) IVPB 600 mg     (MAR Hold since Tue 07/11/2018 at 1246. Reason: Transfer to a Procedural area.)   600 mg 100 mL/hr over 30 Minutes Intravenous Every 6 hours 07/06/18 2245     07/06/18 2315  imipenem-cilastatin (PRIMAXIN) 500 mg in sodium chloride 0.9 % 100 mL IVPB  Status:  Discontinued     500 mg 200 mL/hr over 30 Minutes Intravenous Every 8 hours 07/06/18 2300 07/09/18 1509   07/06/18 2130  metroNIDAZOLE (FLAGYL) IVPB 500 mg  Status:  Discontinued     500 mg 100 mL/hr over 60 Minutes Intravenous Every 8 hours 07/06/18 2029 07/06/18 2245   07/06/18 2030  vancomycin (VANCOCIN) 1,500 mg in sodium chloride 0.9 % 500 mL IVPB     1,500 mg 250 mL/hr over 120 Minutes Intravenous  Once 07/06/18 2026 07/07/18 0015   07/06/18 1530  vancomycin (VANCOCIN) IVPB 1000 mg/200 mL premix     1,000 mg 200 mL/hr over 60 Minutes Intravenous  Once 07/06/18 1525 07/06/18 1810      Medications   Scheduled Meds: . [MAR Hold] nicotine  21 mg Transdermal Daily   Continuous Infusions: . [MAR Hold] sodium chloride 250 mL (07/10/18 2101)  . [MAR Hold] cefTRIAXone (ROCEPHIN)  IV 2 g (07/10/18 1813)  . [MAR Hold] clindamycin (CLEOCIN) IV 600 mg (07/11/18 0923)  . lactated ringers 50 mL/hr at 07/11/18 1305  . [MAR Hold] vancomycin 1,250 mg (07/11/18 1027)   PRN Meds:.[MAR Hold] sodium chloride, [MAR Hold] acetaminophen **OR** [MAR Hold] acetaminophen, [MAR Hold] HYDROcodone-acetaminophen, [MAR Hold] LORazepam, [MAR Hold] ondansetron **OR** [MAR Hold] ondansetron (ZOFRAN) IV, [MAR Hold] senna-docusate   Data Review:   Micro Results Recent Results (from the past 240 hour(s))  Blood culture (routine x 2)     Status: None   Collection Time: 07/06/18  4:45 PM  Result Value Ref Range Status   Specimen Description BLOOD LEFT HAND  Final   Special Requests   Final    BOTTLES DRAWN AEROBIC AND ANAEROBIC Blood Culture adequate  volume   Culture   Final    NO GROWTH 5  DAYS Performed at [facility], [address] [place], [place] [address]    Report Status 07/11/2018 FINAL  Final  Blood culture (routine x 2)     Status: None   Collection Time: 07/06/18  4:46 PM  Result Value Ref Range Status   Specimen Description BLOOD LEFT ARM  Final   Special Requests   Final    BOTTLES DRAWN AEROBIC AND ANAEROBIC Blood Culture results may not be optimal due to an excessive volume of blood received in culture bottles   Culture   Final    NO GROWTH 5 DAYS Performed at [facility], [address] [place], [place] [address]    Report Status 07/11/2018 FINAL  Final  MRSA PCR Screening     Status: None   Collection Time: 07/07/18  6:35 AM  Result Value Ref Range Status   MRSA by PCR NEGATIVE NEGATIVE Final    Comment:        The GeneXpert MRSA Assay (FDA approved for NASAL specimens only), is one component of a comprehensive MRSA colonization surveillance program. It is not intended to diagnose MRSA infection nor to guide or monitor treatment for MRSA infections. Performed at [facility], [address], [place], [place] [address]     Radiology Reports Dg Chest 1 View  Result Date: 07/06/2018 CLINICAL DATA:  Preop.  Fever EXAM: CHEST  1 VIEW COMPARISON:  None. FINDINGS: Mild cardiomegaly. No confluent airspace opacities, effusions or edema. No acute bony abnormality. IMPRESSION: Mild cardiomegaly.  No active disease. Electronically Signed   By: Rolm Baptise M.D.   On: 07/06/2018 19:54   Ct Femur Left W Contrast  Result Date: 07/10/2018 CLINICAL DATA:  Wound infection. Left thigh abscess versus cellulitis. EXAM: CT OF THE LOWER LEFT EXTREMITY WITH CONTRAST TECHNIQUE: Multidetector CT imaging of the left thigh was performed according to the standard protocol following intravenous contrast administration. COMPARISON:  CT and ultrasound 07/06/2018. CONTRAST:  19mL OMNIPAQUE IOHEXOL 300 MG/ML  SOLN FINDINGS:  Bones/Joint/Cartilage No evidence of acute fracture, dislocation or bone destruction. Stable subchondral cyst formation in the left acetabulum. Mild left hip and left knee degenerative changes. No significant hip or joint effusion. Ligaments Suboptimally evaluated by CT. The cruciate ligaments appear intact at the knee. Muscles and Tendons The left thigh muscles and tendons appear normal. The extensor mechanism is intact at the knee. Soft tissues Stable probable reactive left pelvic and inguinal adenopathy, measuring up to 19 mm short axis on image 122/8. There is diffuse dermal thickening and subcutaneous edema medially in the proximal left thigh. The previously demonstrated complex fluid collections are very similar to the previous study, although there is 1 component which appears slightly larger, measuring 6.9 x 4.5 cm on image 163/7 (previously 5.9 x 3.2 cm). The superior to inferior extent is similar, approximately 17 cm. The collections appear unchanged without peripheral enhancement, emphysema or foreign body. There are no new fluid collections. There is stable mild subcutaneous edema laterally in the distal thigh. IMPRESSION: 1. Little change is seen from the prior study of 4 days ago. Findings consistent with persistent medial left thigh cellulitis. Complex subcutaneous fluid collections medially are similar in overall appearance, although one is slightly larger. These remain nonspecific, but could reflect early abscesses. They were visible on prior ultrasound and could be aspirated under ultrasound guidance. 2. No foreign body or soft tissue emphysema. No evidence of deep  infection. 3. No evidence of osteomyelitis. Electronically Signed   By: Richardean Sale M.D.   On: 07/10/2018 11:43   Ct Femur Left W Contrast  Result Date: 07/06/2018 CLINICAL DATA:  Left thigh abscess. EXAM: CT OF THE LOWER RIGHT EXTREMITY WITH CONTRAST TECHNIQUE: Multidetector CT imaging of the lower right extremity was performed  according to the standard protocol following intravenous contrast administration. COMPARISON:  None. CONTRAST:  172mL OMNIPAQUE IOHEXOL 300 MG/ML  SOLN FINDINGS: No fracture or other acute bony abnormality is noted. No definite abnormality seen in the visualized portion of the pelvis. Mildly enlarged left inguinal lymph nodes are noted which most likely are inflammatory in etiology. Stranding of the subcutaneous tissues of the medial and anterior aspect of the proximal left thigh is noted most consistent with cellulitis. There are multiple irregular fluid collections seen in this area which potentially could represent possible developing abscess or abscesses. IMPRESSION: Findings consistent with cellulitis involving the subcutaneous tissues involving the medial and anterior aspect of the proximal left thigh. Multiple irregular low densities are noted within this area concerning for small fluid collections with ill-defined margins; potentially these may represent multiple developing abscesses. However, no definite wall enhancement is noted at this time. Electronically Signed   By: Marijo Conception, M.D.   On: 07/06/2018 20:11   US Venous Img Lower Unilateral Left  Result Date: 07/06/2018 CLINICAL DATA:  63 year old male with left lower extremity redness, swelling, cellulitis and possible abscess. EXAM: LEFT LOWER EXTREMITY VENOUS DOPPLER ULTRASOUND TECHNIQUE: Gray-scale sonography with graded compression, as well as color Doppler and duplex ultrasound were performed to evaluate the lower extremity deep venous systems from the level of the common femoral vein and including the common femoral, femoral, profunda femoral, popliteal and calf veins including the posterior tibial, peroneal and gastrocnemius veins when visible. The superficial great saphenous vein was also interrogated. Spectral Doppler was utilized to evaluate flow at rest and with distal augmentation maneuvers in the common femoral, femoral and  popliteal veins. COMPARISON:  None. FINDINGS: Contralateral Common Femoral Vein: Respiratory phasicity is normal and symmetric with the symptomatic side. No evidence of thrombus. Normal compressibility. Common Femoral Vein: No evidence of thrombus. Normal compressibility, respiratory phasicity and response to augmentation. Saphenofemoral Junction: No evidence of thrombus. Normal compressibility and flow on color Doppler imaging. Profunda Femoral Vein: No evidence of thrombus. Normal compressibility and flow on color Doppler imaging. Femoral Vein: No evidence of thrombus. Normal compressibility, respiratory phasicity and response to augmentation. Popliteal Vein: No evidence of thrombus. Normal compressibility, respiratory phasicity and response to augmentation. Calf Veins: No evidence of thrombus. Normal compressibility and flow on color Doppler imaging. Superficial Great Saphenous Vein: No evidence of thrombus. Normal compressibility. Venous Reflux:  None. Other Findings: Complex fluid collection in the medial upper thigh in the region of the inguinal crease measures approximately 11.5 x 3.0 by 7.8 cm. No evidence of internal vascularity on color Doppler imaging to suggest a solid mass. IMPRESSION: No evidence of deep venous thrombosis. Complex fluid collection in the medial upper thigh measures approximately 11.5 x 3.0 x 7.8 cm. Differential considerations include subcutaneous abscess versus hematoma. Electronically Signed   By: Jacqulynn Cadet M.D.   On: 07/06/2018 16:58     CBC Recent Labs  Lab 07/06/18 1645 07/07/18 0703 07/09/18 0544 07/10/18 0350 07/11/18 0445  WBC 11.4* 10.7* 11.6* 11.5* 12.7*  HGB 13.8 13.6 12.7* 13.6 13.3  HCT 41.4 41.5 39.4 41.8 41.1  PLT 275 269 220 235 256  MCV  93.2 96.1 96.3 96.8 96.3  MCH 31.1 31.5 31.1 31.5 31.1  MCHC 33.3 32.8 32.2 32.5 32.4  RDW 12.4 12.3 12.4 12.3 12.5  LYMPHSABS 1.2  --   --   --   --   MONOABS 0.7  --   --   --   --   EOSABS 0.2  --   --    --   --   BASOSABS 0.1  --   --   --   --     Chemistries  Recent Labs  Lab 07/06/18 1645 07/07/18 0703 07/11/18 0445  NA 136 138  --   K 4.0 4.0  --   CL 98 103  --   CO2 30 30  --   GLUCOSE 125* 125*  --   BUN 19 16  --   CREATININE 0.95 1.04 0.92  CALCIUM 8.6* 8.5*  --   AST 38  --   --   ALT 32  --   --   ALKPHOS 76  --   --   BILITOT 0.6  --   --    ------------------------------------------------------------------------------------------------------------------ estimated creatinine clearance is 115.4 mL/min (by C-G formula based on SCr of 0.92 mg/dL). ------------------------------------------------------------------------------------------------------------------ No results for input(s): HGBA1C in the last 72 hours. ------------------------------------------------------------------------------------------------------------------ No results for input(s): CHOL, HDL, LDLCALC, TRIG, CHOLHDL, LDLDIRECT in the last 72 hours. ------------------------------------------------------------------------------------------------------------------ No results for input(s): TSH, T4TOTAL, T3FREE, THYROIDAB in the last 72 hours.  Invalid input(s): FREET3 ------------------------------------------------------------------------------------------------------------------ No results for input(s): VITAMINB12, FOLATE, FERRITIN, TIBC, IRON, RETICCTPCT in the last 72 hours.  Coagulation profile No results for input(s): INR, PROTIME in the last 168 hours.  No results for input(s): DDIMER in the last 72 hours.  Cardiac Enzymes No results for input(s): CKMB, TROPONINI, MYOGLOBIN in the last 168 hours.  Invalid input(s): CK ------------------------------------------------------------------------------------------------------------------ Invalid input(s): POCBNP    Assessment & Plan   Left thigh abscess-worsening leukocytosis today -CT left femur unchanged from previous study, with complex  subcutaneous fluid collections. -Surgery following- plan for I&D today -Switched to vancomycin, ceftriaxone, and clindamycin per pharmacy recommendations.  May be able to stop clindamycin today, depending on how surgery goes. -Pain control  History of hepatitis C- patient states he has been diagnosed with hepatitis C in the past.  Has never been treated.   -Hep C antibody positive -Hep C RNA quant pending -Needs to follow-up with GI as an outpatient  Tobacco abuse -Tobacco cessation counseling performed this admission -Nicotine patch      Code Status Orders  (From admission, onward)         Start     Ordered   07/06/18 2130  Full code  Continuous     07/06/18 2132        Code Status History    This patient has a current code status but no historical code status.      Consults surgery  DVT Prophylaxis  Lovenox  Lab Results  Component Value Date   PLT 256 07/11/2018   Time Spent in minutes 35 min Greater than 50% of time spent in care coordination and counseling patient regarding the condition and plan of care.   Berna Spare Mayo M.D on 07/11/2018 at 1:19 PM  Between 7am to 6pm - Pager - [phone]  After 6pm go to [email or web address] - Proofreader  [employer]   Office  [phone]

## 2018-07-11 NOTE — Transfer of Care (Signed)
Immediate Anesthesia Transfer of Care Note  Patient: Corey James  Procedure(s) Performed: INCISION AND DRAINAGE ABSCESS-LEFT THIGH (Left Thigh)  Patient Location: PACU  Anesthesia Type:General  Level of Consciousness: awake, alert  and oriented  Airway & Oxygen Therapy: Patient Spontanous Breathing and Patient connected to face mask oxygen  Post-op Assessment: Report given to RN and Post -op Vital signs reviewed and stable  Post vital signs: Reviewed and stable  Last Vitals:  Vitals Value Taken Time  BP 155/81 07/11/2018  3:34 PM  Temp    Pulse 99 07/11/2018  3:35 PM  Resp    SpO2 100 % 07/11/2018  3:35 PM  Vitals shown include unvalidated device data.  Last Pain:  Vitals:   07/11/18 1250  TempSrc: Tympanic  PainSc: 1       Patients Stated Pain Goal: 4 (84/13/24 4010)  Complications: No apparent anesthesia complications

## 2018-07-11 NOTE — Op Note (Signed)
  07/11/2018  3:33 PM  PATIENT:  Corey James  63 y.o. male  PRE-OPERATIVE DIAGNOSIS:  Left Thigh soft tissue infection  POST-OPERATIVE DIAGNOSIS:  Left thigh necrotizing soft tissue infection with abscess   PROCEDURE:  1. Incision and drainage of complex  Abscess left thigh 2. Excisional debridement of skin subcutaneous tissue and muscle measuring 135 square centimeters 15x 9 cms 3. placement wound vac 15x9 cm    SURGEON:  Surgeon(s) and Role:    * Pina Sirianni F, MD - Primary  ASSISTANTS: Dr. Genevive Bi required due to the complexity of the case and for exposure  ANESTHESIA: GETA  FINDINGS: COMPLEX NECROTIZING SOFT TISSUE INFECTION, with large abscess cavity. Drained around 200cc of pus. Skin and fat with ischemia, Fascia for the most part intact.  DICTATION:  Patient was explained about the  Procedure in detail, risks benefits  And possible complications and a consent was obtained. The patient taken to the operating room and placed in the supine position with frog legs. Using 10 blade knife the abscess was drained and cultured . 200 cc of pus drained. We visualized that the skin and sub q fat was ischemic not frankly necrotic but definitely in need for excisional debridement. A wide excisional debridement was performed w cautery. .Currette were used to debride the wound down to the muscle. The fascia appeared intact. There was significant oozing from the wound bed and electrocautery as well as multiple 2-0 silks were used to ligate unnamed vessels. Pulse lavage w 3 LTs of NS was used. Good hemostasis was obtained. A medium size wound vac was cut and customized to the wound. It was connected to 125 mm HG suction . There were no leaks.  Needle and laparotomy counts were correct and there were no immediate complications. HE will need additional exploration wound vac change and  +/- debridement in 48-72  [name], MD

## 2018-07-11 NOTE — Anesthesia Procedure Notes (Signed)
Procedure Name: Intubation Date/Time: 07/11/2018 2:08 PM Performed by: Philbert Riser, CRNA Pre-anesthesia Checklist: Patient identified, Emergency Drugs available, Suction available, Patient being monitored and Timeout performed Patient Re-evaluated:Patient Re-evaluated prior to induction Oxygen Delivery Method: Circle system utilized and Simple face mask Preoxygenation: Pre-oxygenation with 100% oxygen Induction Type: IV induction Ventilation: Mask ventilation with difficulty Laryngoscope Size: McGraph and 4 Grade View: Grade I Tube type: Oral Tube size: 7.5 mm Number of attempts: 1 Airway Equipment and Method: Stylet Placement Confirmation: ETT inserted through vocal cords under direct vision,  positive ETCO2 and breath sounds checked- equal and bilateral Secured at: 22 cm Tube secured with: Tape Dental Injury: Teeth and Oropharynx as per pre-operative assessment  Difficulty Due To: Difficulty was anticipated

## 2018-07-12 ENCOUNTER — Inpatient Hospital Stay: Payer: Self-pay

## 2018-07-12 ENCOUNTER — Encounter: Payer: Self-pay | Admitting: Surgery

## 2018-07-12 DIAGNOSIS — Z978 Presence of other specified devices: Secondary | ICD-10-CM

## 2018-07-12 DIAGNOSIS — F17210 Nicotine dependence, cigarettes, uncomplicated: Secondary | ICD-10-CM

## 2018-07-12 DIAGNOSIS — Z6841 Body Mass Index (BMI) 40.0 and over, adult: Secondary | ICD-10-CM

## 2018-07-12 DIAGNOSIS — Z880 Allergy status to penicillin: Secondary | ICD-10-CM

## 2018-07-12 DIAGNOSIS — M1611 Unilateral primary osteoarthritis, right hip: Secondary | ICD-10-CM

## 2018-07-12 DIAGNOSIS — I96 Gangrene, not elsewhere classified: Secondary | ICD-10-CM

## 2018-07-12 DIAGNOSIS — Z9181 History of falling: Secondary | ICD-10-CM

## 2018-07-12 DIAGNOSIS — G9589 Other specified diseases of spinal cord: Secondary | ICD-10-CM

## 2018-07-12 DIAGNOSIS — R739 Hyperglycemia, unspecified: Secondary | ICD-10-CM

## 2018-07-12 DIAGNOSIS — M5117 Intervertebral disc disorders with radiculopathy, lumbosacral region: Secondary | ICD-10-CM

## 2018-07-12 LAB — CBC
HEMATOCRIT: 41.1 % (ref 39.0–52.0)
Hemoglobin: 13.2 g/dL (ref 13.0–17.0)
MCH: 31.1 pg (ref 26.0–34.0)
MCHC: 32.1 g/dL (ref 30.0–36.0)
MCV: 96.9 fL (ref 80.0–100.0)
Platelets: 295 10*3/uL (ref 150–400)
RBC: 4.24 MIL/uL (ref 4.22–5.81)
RDW: 12.4 % (ref 11.5–15.5)
WBC: 14.5 10*3/uL — ABNORMAL HIGH (ref 4.0–10.5)
nRBC: 0 % (ref 0.0–0.2)

## 2018-07-12 MED ORDER — SODIUM CHLORIDE 0.9% FLUSH: 10.0000 mL | INTRAVENOUS | Status: DC | PRN

## 2018-07-12 MED ORDER — HYDROMORPHONE HCL 1 MG/ML IJ SOLN
1.0000 mg | INTRAMUSCULAR | Status: DC | PRN
  Administered 2018-07-12 – 2018-07-13 (×6): 1 mg via INTRAVENOUS
  Filled 2018-07-12 (×6): qty 1

## 2018-07-12 MED ORDER — SODIUM CHLORIDE 0.9% FLUSH
10.0000 mL | Freq: Two times a day (BID) | INTRAVENOUS | Status: DC
  Administered 2018-07-12 – 2018-07-13 (×4): 10 mL

## 2018-07-12 NOTE — Progress Notes (Signed)
Peripherally Inserted Central Catheter/Midline Placement  The IV Nurse has discussed with the patient and/or persons authorized to consent for the patient, the purpose of this procedure and the potential benefits and risks involved with this procedure.  The benefits include less needle sticks, lab draws from the catheter, and the patient may be discharged home with the catheter. Risks include, but not limited to, infection, bleeding, blood clot (thrombus formation), and puncture of an artery; nerve damage and irregular heartbeat and possibility to perform a PICC exchange if needed/ordered by physician.  Alternatives to this procedure were also discussed.  Bard Power PICC patient education guide, fact sheet on infection prevention and patient information card has been provided to patient /or left at bedside.    PICC/Midline Placement Documentation  PICC Single Lumen 27/74/12 PICC Right Basilic 43 cm 0 cm (Active)  Indication for Insertion or Continuance of Line Home intravenous therapies (PICC only) 07/12/2018  3:15 PM  Exposed Catheter (cm) 0 cm 07/12/2018  3:15 PM  Site Assessment Clean;Dry;Intact 07/12/2018  3:15 PM  Line Status Flushed;Blood return noted;Saline locked 07/12/2018  3:15 PM  Dressing Status Clean;Dry;Intact;Antimicrobial disc in place 07/12/2018  3:15 PM  Dressing Change Due 07/19/18 07/12/2018  3:15 PM       Corey James 07/12/2018, 3:24 PM

## 2018-07-12 NOTE — Progress Notes (Signed)
[facility] Day(s): 6.   Post op day(s): 1 Day Post-Op.   Interval History: Patient seen and examined, no acute events or new complaints overnight. Patient reports he is feeling much better this morning. No complaints of fever or chills. Tolerating a diet.   Review of Systems:  Constitutional: denies fever, chills  Integumentary: denies any other rashes or skin discolorations except wound in left upper thigh   Vital signs in last 24 hours: [min-max] current  Temp:  [97.1 F (36.2 C)-98 F (36.7 C)] 97.1 F (36.2 C) (02/19 0756) Pulse Rate:  [79-106] 87 (02/19 0756) Resp:  [16-23] 20 (02/19 0756) BP: (140-170)/(71-89) 147/82 (02/19 0756) SpO2:  [91 %-100 %] 93 % (02/19 0756)     Height: 5\' 9"  (175.3 cm) Weight: 136 kg BMI (Calculated): 44.26   Intake/Output this shift:  No intake/output data recorded.    Physical Exam:  Constitutional: alert, cooperative and no distress  Respiratory: breathing non-labored at rest  Integumentary: wound vacuum to left upper medial thigh with good seal, minimal surrounding erythema which is improved.    Labs:  CBC Latest Ref Rng & Units 07/12/2018 07/11/2018 07/10/2018  WBC 4.0 - 10.5 K/uL 14.5(H) 12.7(H) 11.5(H)  Hemoglobin 13.0 - 17.0 g/dL 13.2 13.3 13.6  Hematocrit 39.0 - 52.0 % 41.1 41.1 41.8  Platelets 150 - 400 K/uL 295 256 235   CMP Latest Ref Rng & Units 07/11/2018 07/07/2018 07/06/2018  Glucose 70 - 99 mg/dL - 125(H) 125(H)  BUN 8 - 23 mg/dL - 16 19  Creatinine 0.61 - 1.24 mg/dL 0.92 1.04 0.95  Sodium 135 - 145 mmol/L - 138 136  Potassium 3.5 - 5.1 mmol/L - 4.0 4.0  Chloride 98 - 111 mmol/L - 103 98  CO2 22 - 32 mmol/L - 30 30  Calcium 8.9 - 10.3 mg/dL - 8.5(L) 8.6(L)  Total Protein 6.5 - 8.1 g/dL - - 7.1  Total Bilirubin 0.3 - 1.2 mg/dL - - 0.6  Alkaline Phos 38 - 126 U/L - - 76  AST 15 - 41 U/L - - 38  ALT 0 - 44 U/L - - 32    Imaging studies: No new pertinent imaging  studies   Assessment/Plan: (ICD-10's: L03.116) 63 y.o. male with improved pain and erythema to the left medial upper thigh 1 Day Post-Op s/p I&D and wound vac placement for left upper thigh abscess, complicated by pertinent comorbidities including obesity and current tobacco abuse (smoking).   - Continue diet, IV ABx  - Monitor wound vac output, leukocytosis   - Will plan to take back to the OR on Friday for formal wound vac change   - Medical management per primary team   All of the above findings and recommendations were discussed with the patient, and the medical team, and all of patient's questions were answered to his expressed satisfaction.  -- Edison Simon, PA-C [facility] 07/12/2018, 9:16 AM [phone] M-F: 7am - 4pm

## 2018-07-12 NOTE — Progress Notes (Signed)
ID Asked to see patient-for thigh infection. He is getting PICC now. Also had I/d on 2/18 and has a wound vac . Culture pending Currently on vanco, ceftriaxone and clinda. Continue all until culture available Would need to see the wound or have a picture of the wound in media for any recommendation. Consult will be deferred until then. Informed primary team
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
## 2018-07-12 NOTE — Progress Notes (Signed)
[place] at [facility]                                                                                                                                                                                  Patient Demographics   Corey James, is a 63 y.o. male, DOB - [birth year], JHE:[id number]  Admit date - 07/06/2018   Admitting Physician Saundra Shelling, MD  Outpatient Primary MD for the patient is System, Provider Not In   LOS - 6  Subjective: Patient states he is doing okay today.  He is a little bit concerned about going home with a wound VAC.  He denies any fevers or chills.  He states the surgery went pretty well yesterday as far as he knows.  Review of Systems:   CONSTITUTIONAL: No documented fever. No fatigue, weakness. No weight gain, no weight loss.  EYES: No blurry or double vision.  ENT: No tinnitus. No postnasal drip. No redness of the oropharynx.  RESPIRATORY: No cough, no wheeze, no hemoptysis. No dyspnea.  CARDIOVASCULAR: No chest pain. No orthopnea. No palpitations. No syncope.  GASTROINTESTINAL: No nausea, no vomiting or diarrhea. No abdominal pain. No melena or hematochezia.  GENITOURINARY: No dysuria or hematuria.  ENDOCRINE: No polyuria or nocturia. No heat or cold intolerance.  HEMATOLOGY: No anemia. No bruising. No bleeding.  INTEGUMENTARY: +Left thigh redness MUSCULOSKELETAL: No arthritis. No swelling. No gout.  NEUROLOGIC: No numbness, tingling, or ataxia. No seizure-type activity.  PSYCHIATRIC: +anxiety. No insomnia. No ADD.    Vitals:   Vitals:   07/11/18 1634 07/11/18 1635 07/12/18 0002 07/12/18 0756  BP:  (!) 155/73 (!) 157/74 (!) 147/82  Pulse: 91 93 79 87  Resp:  20 18 20   Temp:   97.8 F (36.6 C) (!) 97.1 F (36.2 C)  TempSrc:   Oral Axillary  SpO2: 94% 96% 93% 93%  Weight:      Height:        Wt Readings from Last 3 Encounters:  07/06/18 136 kg  07/03/18 136.1 kg  05/05/18 (!) 140.6 kg     Intake/Output  Summary (Last 24 hours) at 07/12/2018 1302 Last data filed at 07/12/2018 1038 Gross per 24 hour  Intake 1870 ml  Output 2150 ml  Net -280 ml    Physical Exam:   GENERAL: Pleasant-appearing in no apparent distress.  HEENT: Atraumatic, normocephalic. Extraocular muscles are intact. Pupils equal and reactive to light. Sclerae anicteric. No conjunctival injection. No oro-pharyngeal erythema.  NECK: Supple. There is no jugular venous distention. No bruits, no lymphadenopathy, no thyromegaly.  HEART: Regular rate and rhythm,. No murmurs, no rubs, no clicks.  LUNGS: Clear to auscultation  bilaterally. No rales or rhonchi. No wheezes.  ABDOMEN: Soft, flat, nontender, nondistended. Has good bowel sounds. No hepatosplenomegaly appreciated.  EXTREMITIES: Wound VAC in place over left medial/posterior surgical incision on left thigh. NEUROLOGIC: The patient is alert, awake, and oriented x 3 with no focal motor or sensory deficits appreciated bilaterally.  SKIN: Moist and warm with no rashes appreciated.  Psych: Not anxious, depressed   Antibiotics   Anti-infectives (From admission, onward)   Start     Dose/Rate Route Frequency Ordered Stop   07/10/18 1800  cefTRIAXone (ROCEPHIN) 2 g in sodium chloride 0.9 % 100 mL IVPB     2 g 200 mL/hr over 30 Minutes Intravenous Every 24 hours 07/10/18 1556     07/09/18 1800  imipenem-cilastatin (PRIMAXIN) 500 mg in sodium chloride 0.9 % 100 mL IVPB  Status:  Discontinued     500 mg 200 mL/hr over 30 Minutes Intravenous Every 6 hours 07/09/18 1509 07/10/18 1556   07/07/18 0800  vancomycin (VANCOCIN) 1,250 mg in sodium chloride 0.9 % 250 mL IVPB     1,250 mg 166.7 mL/hr over 90 Minutes Intravenous Every 12 hours 07/06/18 2031     07/07/18 0000  clindamycin (CLEOCIN) IVPB 600 mg     600 mg 100 mL/hr over 30 Minutes Intravenous Every 6 hours 07/06/18 2245     07/06/18 2315  imipenem-cilastatin (PRIMAXIN) 500 mg in sodium chloride 0.9 % 100 mL IVPB  Status:   Discontinued     500 mg 200 mL/hr over 30 Minutes Intravenous Every 8 hours 07/06/18 2300 07/09/18 1509   07/06/18 2130  metroNIDAZOLE (FLAGYL) IVPB 500 mg  Status:  Discontinued     500 mg 100 mL/hr over 60 Minutes Intravenous Every 8 hours 07/06/18 2029 07/06/18 2245   07/06/18 2030  vancomycin (VANCOCIN) 1,500 mg in sodium chloride 0.9 % 500 mL IVPB     1,500 mg 250 mL/hr over 120 Minutes Intravenous  Once 07/06/18 2026 07/07/18 0015   07/06/18 1530  vancomycin (VANCOCIN) IVPB 1000 mg/200 mL premix     1,000 mg 200 mL/hr over 60 Minutes Intravenous  Once 07/06/18 1525 07/06/18 1810      Medications   Scheduled Meds: . nicotine  21 mg Transdermal Daily   Continuous Infusions: . sodium chloride 250 mL (07/12/18 0215)  . cefTRIAXone (ROCEPHIN)  IV 2 g (07/11/18 1702)  . clindamycin (CLEOCIN) IV 600 mg (07/12/18 1132)  . lactated ringers 50 mL/hr at 07/11/18 1305  . vancomycin 1,250 mg (07/11/18 2153)   PRN Meds:.sodium chloride, acetaminophen **OR** acetaminophen, HYDROcodone-acetaminophen, LORazepam, morphine injection, ondansetron **OR** ondansetron (ZOFRAN) IV, senna-docusate   Data Review:   Micro Results Recent Results (from the past 240 hour(s))  Blood culture (routine x 2)     Status: None   Collection Time: 07/06/18  4:45 PM  Result Value Ref Range Status   Specimen Description BLOOD LEFT HAND  Final   Special Requests   Final    BOTTLES DRAWN AEROBIC AND ANAEROBIC Blood Culture adequate volume   Culture   Final    NO GROWTH 5 DAYS Performed at [facility], [address] [place], [place] [address]    Report Status 07/11/2018 FINAL  Final  Blood culture (routine x 2)     Status: None   Collection Time: 07/06/18  4:46 PM  Result Value Ref Range Status   Specimen Description BLOOD LEFT ARM  Final   Special Requests   Final    BOTTLES DRAWN AEROBIC  AND ANAEROBIC Blood Culture results may not be optimal due to an excessive volume of blood received in  culture bottles   Culture   Final    NO GROWTH 5 DAYS Performed at [facility], [address] [place], [place] [address]    Report Status 07/11/2018 FINAL  Final  MRSA PCR Screening     Status: None   Collection Time: 07/07/18  6:35 AM  Result Value Ref Range Status   MRSA by PCR NEGATIVE NEGATIVE Final    Comment:        The GeneXpert MRSA Assay (FDA approved for NASAL specimens only), is one component of a comprehensive MRSA colonization surveillance program. It is not intended to diagnose MRSA infection nor to guide or monitor treatment for MRSA infections. Performed at [facility], [address] [place], [place] [address]   Aerobic/Anaerobic Culture (surgical/deep wound)     Status: None (Preliminary result)   Collection Time: 07/11/18  2:20 PM  Result Value Ref Range Status   Specimen Description   Final    WOUND LEFT THIGH Performed at [facility], [place], [address], [place], [place] [address]    Special Requests   Final    NONE Performed at [facility], [address] [place], [place] [address]    Gram Stain   Final    ABUNDANT WBC PRESENT, PREDOMINANTLY PMN RARE GRAM POSITIVE COCCI    Culture   Final    NO GROWTH < 24 HOURS Performed at [facility], [place] [address], [place], [place] [address]    Report Status PENDING  Incomplete  Aerobic/Anaerobic Culture (surgical/deep wound)     Status: None (Preliminary result)   Collection Time: 07/11/18  2:28 PM  Result Value Ref Range Status   Specimen Description   Final    WOUND LEFT THIGH Performed at [facility], [address] [place], [place] [address]    Special Requests   Final    NONE Performed at [facility], [address] [place], [place] [address]    Gram Stain   Final    MODERATE WBC PRESENT, PREDOMINANTLY PMN RARE GRAM POSITIVE COCCI    Culture   Final    NO GROWTH < 24 HOURS Performed at [facility], [place]  [address], [place],  [address]    Report Status PENDING  Incomplete    Radiology Reports Dg Chest 1 View  Result Date: 07/06/2018 CLINICAL DATA:  Preop.  Fever EXAM: CHEST  1 VIEW COMPARISON:  None. FINDINGS: Mild cardiomegaly. No confluent airspace opacities, effusions or edema. No acute bony abnormality. IMPRESSION: Mild cardiomegaly.  No active disease. Electronically Signed   By: Rolm Baptise M.D.   On: 07/06/2018 19:54   Ct Femur Left W Contrast  Result Date: 07/10/2018 CLINICAL DATA:  Wound infection. Left thigh abscess versus cellulitis. EXAM: CT OF THE LOWER LEFT EXTREMITY WITH CONTRAST TECHNIQUE: Multidetector CT imaging of the left thigh was performed according to the standard protocol following intravenous contrast administration. COMPARISON:  CT and ultrasound 07/06/2018. CONTRAST:  180mL OMNIPAQUE IOHEXOL 300 MG/ML  SOLN FINDINGS: Bones/Joint/Cartilage No evidence of acute fracture, dislocation or bone destruction. Stable subchondral cyst formation in the left acetabulum. Mild left hip and left knee degenerative changes. No significant hip or joint effusion. Ligaments Suboptimally evaluated by CT. The cruciate ligaments appear intact at the knee. Muscles and Tendons The left thigh muscles and tendons appear normal. The extensor mechanism is  intact at the knee. Soft tissues Stable probable reactive left pelvic and inguinal adenopathy, measuring up to 19 mm short axis on image 122/8. There is diffuse dermal thickening and subcutaneous edema medially in the proximal left thigh. The previously demonstrated complex fluid collections are very similar to the previous study, although there is 1 component which appears slightly larger, measuring 6.9 x 4.5 cm on image 163/7 (previously 5.9 x 3.2 cm). The superior to inferior extent is similar, approximately 17 cm. The collections appear unchanged without peripheral enhancement, emphysema or foreign body. There are no new fluid collections. There is  stable mild subcutaneous edema laterally in the distal thigh. IMPRESSION: 1. Little change is seen from the prior study of 4 days ago. Findings consistent with persistent medial left thigh cellulitis. Complex subcutaneous fluid collections medially are similar in overall appearance, although one is slightly larger. These remain nonspecific, but could reflect early abscesses. They were visible on prior ultrasound and could be aspirated under ultrasound guidance. 2. No foreign body or soft tissue emphysema. No evidence of deep infection. 3. No evidence of osteomyelitis. Electronically Signed   By: Richardean Sale M.D.   On: 07/10/2018 11:43   Ct Femur Left W Contrast  Result Date: 07/06/2018 CLINICAL DATA:  Left thigh abscess. EXAM: CT OF THE LOWER RIGHT EXTREMITY WITH CONTRAST TECHNIQUE: Multidetector CT imaging of the lower right extremity was performed according to the standard protocol following intravenous contrast administration. COMPARISON:  None. CONTRAST:  123mL OMNIPAQUE IOHEXOL 300 MG/ML  SOLN FINDINGS: No fracture or other acute bony abnormality is noted. No definite abnormality seen in the visualized portion of the pelvis. Mildly enlarged left inguinal lymph nodes are noted which most likely are inflammatory in etiology. Stranding of the subcutaneous tissues of the medial and anterior aspect of the proximal left thigh is noted most consistent with cellulitis. There are multiple irregular fluid collections seen in this area which potentially could represent possible developing abscess or abscesses. IMPRESSION: Findings consistent with cellulitis involving the subcutaneous tissues involving the medial and anterior aspect of the proximal left thigh. Multiple irregular low densities are noted within this area concerning for small fluid collections with ill-defined margins; potentially these may represent multiple developing abscesses. However, no definite wall enhancement is noted at this time.  Electronically Signed   By: Marijo Conception, M.D.   On: 07/06/2018 20:11   US Venous Img Lower Unilateral Left  Result Date: 07/06/2018 CLINICAL DATA:  63 year old male with left lower extremity redness, swelling, cellulitis and possible abscess. EXAM: LEFT LOWER EXTREMITY VENOUS DOPPLER ULTRASOUND TECHNIQUE: Gray-scale sonography with graded compression, as well as color Doppler and duplex ultrasound were performed to evaluate the lower extremity deep venous systems from the level of the common femoral vein and including the common femoral, femoral, profunda femoral, popliteal and calf veins including the posterior tibial, peroneal and gastrocnemius veins when visible. The superficial great saphenous vein was also interrogated. Spectral Doppler was utilized to evaluate flow at rest and with distal augmentation maneuvers in the common femoral, femoral and popliteal veins. COMPARISON:  None. FINDINGS: Contralateral Common Femoral Vein: Respiratory phasicity is normal and symmetric with the symptomatic side. No evidence of thrombus. Normal compressibility. Common Femoral Vein: No evidence of thrombus. Normal compressibility, respiratory phasicity and response to augmentation. Saphenofemoral Junction: No evidence of thrombus. Normal compressibility and flow on color Doppler imaging. Profunda Femoral Vein: No evidence of thrombus. Normal compressibility and flow on color Doppler imaging. Femoral Vein: No evidence of thrombus. Normal  compressibility, respiratory phasicity and response to augmentation. Popliteal Vein: No evidence of thrombus. Normal compressibility, respiratory phasicity and response to augmentation. Calf Veins: No evidence of thrombus. Normal compressibility and flow on color Doppler imaging. Superficial Great Saphenous Vein: No evidence of thrombus. Normal compressibility. Venous Reflux:  None. Other Findings: Complex fluid collection in the medial upper thigh in the region of the inguinal crease  measures approximately 11.5 x 3.0 by 7.8 cm. No evidence of internal vascularity on color Doppler imaging to suggest a solid mass. IMPRESSION: No evidence of deep venous thrombosis. Complex fluid collection in the medial upper thigh measures approximately 11.5 x 3.0 x 7.8 cm. Differential considerations include subcutaneous abscess versus hematoma. Electronically Signed   By: Jacqulynn Cadet M.D.   On: 07/06/2018 16:58     CBC Recent Labs  Lab 07/06/18 1645 07/07/18 0703 07/09/18 0544 07/10/18 0350 07/11/18 0445 07/12/18 0406  WBC 11.4* 10.7* 11.6* 11.5* 12.7* 14.5*  HGB 13.8 13.6 12.7* 13.6 13.3 13.2  HCT 41.4 41.5 39.4 41.8 41.1 41.1  PLT 275 269 220 235 256 295  MCV 93.2 96.1 96.3 96.8 96.3 96.9  MCH 31.1 31.5 31.1 31.5 31.1 31.1  MCHC 33.3 32.8 32.2 32.5 32.4 32.1  RDW 12.4 12.3 12.4 12.3 12.5 12.4  LYMPHSABS 1.2  --   --   --   --   --   MONOABS 0.7  --   --   --   --   --   EOSABS 0.2  --   --   --   --   --   BASOSABS 0.1  --   --   --   --   --     Chemistries  Recent Labs  Lab 07/06/18 1645 07/07/18 0703 07/11/18 0445  NA 136 138  --   K 4.0 4.0  --   CL 98 103  --   CO2 30 30  --   GLUCOSE 125* 125*  --   BUN 19 16  --   CREATININE 0.95 1.04 0.92  CALCIUM 8.6* 8.5*  --   AST 38  --   --   ALT 32  --   --   ALKPHOS 76  --   --   BILITOT 0.6  --   --    ------------------------------------------------------------------------------------------------------------------ estimated creatinine clearance is 115.4 mL/min (by C-G formula based on SCr of 0.92 mg/dL). ------------------------------------------------------------------------------------------------------------------ No results for input(s): HGBA1C in the last 72 hours. ------------------------------------------------------------------------------------------------------------------ No results for input(s): CHOL, HDL, LDLCALC, TRIG, CHOLHDL, LDLDIRECT in the last 72  hours. ------------------------------------------------------------------------------------------------------------------ No results for input(s): TSH, T4TOTAL, T3FREE, THYROIDAB in the last 72 hours.  Invalid input(s): FREET3 ------------------------------------------------------------------------------------------------------------------ No results for input(s): VITAMINB12, FOLATE, FERRITIN, TIBC, IRON, RETICCTPCT in the last 72 hours.  Coagulation profile No results for input(s): INR, PROTIME in the last 168 hours.  No results for input(s): DDIMER in the last 72 hours.  Cardiac Enzymes No results for input(s): CKMB, TROPONINI, MYOGLOBIN in the last 168 hours.  Invalid input(s): CK ------------------------------------------------------------------------------------------------------------------ Invalid input(s): POCBNP    Assessment & Plan   Left thigh abscess/necrotizing soft tissue infection- worsening leukocytosis today -s/p I&D of complex left thigh abscess, excisional debridement of subcutaneous tissue and muscle, and wound VAC placement 07/11/18 -Surgery following- plan for wound vac change and possible repeat debridement 2/21 -Follow-up intraoperative wound cultures -ID consult -Continue vancomycin, ceftriaxone, and clindamycin -Pain control  History of hepatitis C- patient states he has been diagnosed with hepatitis C in the  past.  Has never been treated.   -Hep C antibody positive -Hep C RNA quant pending -Needs to follow-up with GI as an outpatient  Tobacco abuse -Tobacco cessation counseling performed this admission -Nicotine patch      Code Status Orders  (From admission, onward)         Start     Ordered   07/06/18 2130  Full code  Continuous     07/06/18 2132        Code Status History    This patient has a current code status but no historical code status.      Consults surgery, ID  DVT Prophylaxis  Lovenox  Lab Results  Component  Value Date   PLT 295 07/12/2018   Time Spent in minutes 35 min Greater than 50% of time spent in care coordination and counseling patient regarding the condition and plan of care.   Berna Spare  M.D on 07/12/2018 at 1:02 PM  Between 7am to 6pm - Pager - [phone]  After 6pm go to [email or web address] - Proofreader  [employer]   Office  [phone]

## 2018-07-13 ENCOUNTER — Encounter: Payer: Self-pay | Admitting: Anesthesiology

## 2018-07-13 LAB — CBC
HCT: 38 % — ABNORMAL LOW (ref 39.0–52.0)
Hemoglobin: 12.2 g/dL — ABNORMAL LOW (ref 13.0–17.0)
MCH: 31.1 pg (ref 26.0–34.0)
MCHC: 32.1 g/dL (ref 30.0–36.0)
MCV: 96.9 fL (ref 80.0–100.0)
PLATELETS: 289 10*3/uL (ref 150–400)
RBC: 3.92 MIL/uL — AB (ref 4.22–5.81)
RDW: 12.7 % (ref 11.5–15.5)
WBC: 9.3 10*3/uL (ref 4.0–10.5)
nRBC: 0 % (ref 0.0–0.2)

## 2018-07-13 LAB — BASIC METABOLIC PANEL
Anion gap: 6 (ref 5–15)
BUN: 20 mg/dL (ref 8–23)
CO2: 30 mmol/L (ref 22–32)
Calcium: 8.1 mg/dL — ABNORMAL LOW (ref 8.9–10.3)
Chloride: 100 mmol/L (ref 98–111)
Creatinine, Ser: 0.98 mg/dL (ref 0.61–1.24)
GFR calc Af Amer: >60 mL/min (ref >60)
GFR calc non Af Amer: >60 mL/min (ref >60)
Glucose, Bld: 139 mg/dL — ABNORMAL HIGH (ref 70–99)
POTASSIUM: 4.4 mmol/L (ref 3.5–5.1)
Sodium: 136 mmol/L (ref 135–145)

## 2018-07-13 LAB — VANCOMYCIN, PEAK: Vancomycin Pk: 25 ug/mL — ABNORMAL LOW (ref 30–40)

## 2018-07-13 LAB — HCV RNA QUANT
HCV Quantitative Log: 6.444 log10 IU/mL (ref >1.70)
HCV Quantitative: 2780000 IU/mL (ref >50)

## 2018-07-13 LAB — VANCOMYCIN, TROUGH: Vancomycin Tr: 12 ug/mL — ABNORMAL LOW (ref 15–20)

## 2018-07-13 LAB — SURGICAL PATHOLOGY

## 2018-07-13 MED ORDER — HYDROMORPHONE HCL 1 MG/ML IJ SOLN
2.0000 mg | INTRAMUSCULAR | Status: DC | PRN
  Administered 2018-07-13 – 2018-07-14 (×5): 2 mg via INTRAVENOUS
  Filled 2018-07-13 (×7): qty 2

## 2018-07-13 MED ORDER — LACTATED RINGERS IV SOLN
INTRAVENOUS | Status: DC
  Administered 2018-07-14: 02:00:00 via INTRAVENOUS

## 2018-07-13 NOTE — Plan of Care (Signed)
Patient Wound Vac disk hass become dislodged from the foam and pressed against patient's skin. Vac will no longer draw as needed and continues to alarm.  Vac has already been re-sealed and 4x within the past 24 hours to fix prior air leaks.  Since this situation required a full break down of the [facility] and re-placement (Gen OR (Dr. Lysle Pearl) was paged. Dr. Hanley Seamen verbal orders to remove Vac and sponge from wound. Pack with wet/dry, ABD and paper tape.  Plans are to go back to the OR tomorrow and will re-place [place] at that time.  Report will be given to 3rd shift RN with plans to remove [place] at next dilaudid PRN time. ([place] 9-10pm).

## 2018-07-13 NOTE — Progress Notes (Signed)
[place] at [facility]                                                                                                                                                                                  Patient Demographics   Corey James, is a 63 y.o. male, DOB - Jun 09, 1955, OAC:[id number]  Admit date - 07/06/2018   Admitting Physician Saundra Shelling, MD  Outpatient Primary MD for the patient is System, Provider Not In   LOS - 7  Subjective: States that he is doing okay today.  He is very frustrated that he cannot get up and move around.  He does not like being attached to the wound VAC and wishes that it was more portable.  He states that the Dilaudid is helping his pain a little bit but not too much.  Review of Systems:   CONSTITUTIONAL: No documented fever. No fatigue, weakness. No weight gain, no weight loss.  EYES: No blurry or double vision.  ENT: No tinnitus. No postnasal drip. No redness of the oropharynx.  RESPIRATORY: No cough, no wheeze, no hemoptysis. No dyspnea.  CARDIOVASCULAR: No chest pain. No orthopnea. No palpitations. No syncope.  GASTROINTESTINAL: No nausea, no vomiting or diarrhea. No abdominal pain. No melena or hematochezia.  GENITOURINARY: No dysuria or hematuria.  ENDOCRINE: No polyuria or nocturia. No heat or cold intolerance.  HEMATOLOGY: No anemia. No bruising. No bleeding.  INTEGUMENTARY: +Left thigh redness MUSCULOSKELETAL: No arthritis. No swelling. No gout.  NEUROLOGIC: No numbness, tingling, or ataxia. No seizure-type activity.  PSYCHIATRIC: +anxiety. No insomnia. No ADD.    Vitals:   Vitals:   07/12/18 1545 07/12/18 2305 07/13/18 0751 07/13/18 1537  BP: (!) 160/76 128/70 (!) 148/95 (!) 151/74  Pulse: 82 73 90 78  Resp:  20    Temp: 97.8 F (36.6 C) 98.1 F (36.7 C) 98.5 F (36.9 C) 97.7 F (36.5 C)  TempSrc: Oral Oral Oral Oral  SpO2: 95% 94% 96% 98%  Weight:      Height:        Wt Readings from Last 3  Encounters:  07/06/18 136 kg  07/03/18 136.1 kg  05/05/18 (!) 140.6 kg     Intake/Output Summary (Last 24 hours) at 07/13/2018 1653 Last data filed at 07/13/2018 1300 Gross per 24 hour  Intake 370 ml  Output 100 ml  Net 270 ml    Physical Exam:   GENERAL: Well-appearing in no apparent distress.  HEENT: Atraumatic, normocephalic. Extraocular muscles are intact. Pupils equal and reactive to light. Sclerae anicteric. No conjunctival injection. No oro-pharyngeal erythema.  NECK: Supple. There is no jugular venous distention. No bruits, no lymphadenopathy, no  thyromegaly.  HEART: Regular rate and rhythm,. No murmurs, no rubs, no clicks.  LUNGS: Clear to auscultation bilaterally. No rales or rhonchi. No wheezes.  ABDOMEN: Soft, flat, nontender, nondistended. Has good bowel sounds. No hepatosplenomegaly appreciated.  EXTREMITIES: Wound VAC in place over left medial/posterior surgical incision on left thigh. + Serosanguineous drainage present in wound VAC. NEUROLOGIC: The patient is alert, awake, and oriented x 3 with no focal motor or sensory deficits appreciated bilaterally.  SKIN: Moist and warm with no rashes appreciated.  Psych: Not anxious, depressed   Antibiotics   Anti-infectives (From admission, onward)   Start     Dose/Rate Route Frequency Ordered Stop   07/10/18 1800  cefTRIAXone (ROCEPHIN) 2 g in sodium chloride 0.9 % 100 mL IVPB     2 g 200 mL/hr over 30 Minutes Intravenous Every 24 hours 07/10/18 1556     07/09/18 1800  imipenem-cilastatin (PRIMAXIN) 500 mg in sodium chloride 0.9 % 100 mL IVPB  Status:  Discontinued     500 mg 200 mL/hr over 30 Minutes Intravenous Every 6 hours 07/09/18 1509 07/10/18 1556   07/07/18 0800  vancomycin (VANCOCIN) 1,250 mg in sodium chloride 0.9 % 250 mL IVPB     1,250 mg 166.7 mL/hr over 90 Minutes Intravenous Every 12 hours 07/06/18 2031     07/07/18 0000  clindamycin (CLEOCIN) IVPB 600 mg     600 mg 100 mL/hr over 30 Minutes Intravenous  Every 6 hours 07/06/18 2245     07/06/18 2315  imipenem-cilastatin (PRIMAXIN) 500 mg in sodium chloride 0.9 % 100 mL IVPB  Status:  Discontinued     500 mg 200 mL/hr over 30 Minutes Intravenous Every 8 hours 07/06/18 2300 07/09/18 1509   07/06/18 2130  metroNIDAZOLE (FLAGYL) IVPB 500 mg  Status:  Discontinued     500 mg 100 mL/hr over 60 Minutes Intravenous Every 8 hours 07/06/18 2029 07/06/18 2245   07/06/18 2030  vancomycin (VANCOCIN) 1,500 mg in sodium chloride 0.9 % 500 mL IVPB     1,500 mg 250 mL/hr over 120 Minutes Intravenous  Once 07/06/18 2026 07/07/18 0015   07/06/18 1530  vancomycin (VANCOCIN) IVPB 1000 mg/200 mL premix     1,000 mg 200 mL/hr over 60 Minutes Intravenous  Once 07/06/18 1525 07/06/18 1810      Medications   Scheduled Meds: . nicotine  21 mg Transdermal Daily  . sodium chloride flush  10-40 mL Intracatheter Q12H   Continuous Infusions: . sodium chloride 250 mL (07/12/18 0215)  . cefTRIAXone (ROCEPHIN)  IV 2 g (07/12/18 1755)  . clindamycin (CLEOCIN) IV 600 mg (07/13/18 1344)  . [START ON 07/14/2018] lactated ringers    . vancomycin 1,250 mg (07/13/18 1155)   PRN Meds:.sodium chloride, acetaminophen **OR** acetaminophen, HYDROcodone-acetaminophen, HYDROmorphone (DILAUDID) injection, LORazepam, ondansetron **OR** ondansetron (ZOFRAN) IV, senna-docusate, sodium chloride flush   Data Review:   Micro Results Recent Results (from the past 240 hour(s))  Blood culture (routine x 2)     Status: None   Collection Time: 07/06/18  4:45 PM  Result Value Ref Range Status   Specimen Description BLOOD LEFT HAND  Final   Special Requests   Final    BOTTLES DRAWN AEROBIC AND ANAEROBIC Blood Culture adequate volume   Culture   Final    NO GROWTH 5 DAYS Performed at [facility], [address], [place], [place] [address]    Report Status 07/11/2018 FINAL  Final  Blood culture (routine x 2)  Status: None   Collection Time: 07/06/18  4:46 PM  Result  Value Ref Range Status   Specimen Description BLOOD LEFT ARM  Final   Special Requests   Final    BOTTLES DRAWN AEROBIC AND ANAEROBIC Blood Culture results may not be optimal due to an excessive volume of blood received in culture bottles   Culture   Final    NO GROWTH 5 DAYS Performed at [facility], [address] [place], [place] [address]    Report Status 07/11/2018 FINAL  Final  MRSA PCR Screening     Status: None   Collection Time: 07/07/18  6:35 AM  Result Value Ref Range Status   MRSA by PCR NEGATIVE NEGATIVE Final    Comment:        The GeneXpert MRSA Assay (FDA approved for NASAL specimens only), is one component of a comprehensive MRSA colonization surveillance program. It is not intended to diagnose MRSA infection nor to guide or monitor treatment for MRSA infections. Performed at [facility], [address] [place], [place] [address]   Aerobic/Anaerobic Culture (surgical/deep wound)     Status: None (Preliminary result)   Collection Time: 07/11/18  2:20 PM  Result Value Ref Range Status   Specimen Description   Final    WOUND LEFT THIGH Performed at [facility], [address] [place], [place] [address]    Special Requests   Final    NONE Performed at [facility], [place], [place] [address]    Gram Stain   Final    ABUNDANT WBC PRESENT, PREDOMINANTLY PMN RARE GRAM POSITIVE COCCI Performed at [facility], [place] [address], [place], [place] [address]    Culture   Final    CULTURE REINCUBATED FOR BETTER GROWTH NO ANAEROBES ISOLATED; CULTURE IN PROGRESS FOR 5 DAYS    Report Status PENDING  Incomplete  Aerobic/Anaerobic Culture (surgical/deep wound)     Status: None (Preliminary result)   Collection Time: 07/11/18  2:28 PM  Result Value Ref Range Status   Specimen Description   Final    WOUND LEFT THIGH Performed at [facility], [address] [place], [place] [address]     Special Requests   Final    NONE Performed at [facility], [address], [place], [place] [address]    Gram Stain   Final    MODERATE WBC PRESENT, PREDOMINANTLY PMN RARE GRAM POSITIVE COCCI Performed at [facility], [place] [address], [place], [place] [address]    Culture   Final    CULTURE REINCUBATED FOR BETTER GROWTH NO ANAEROBES ISOLATED; CULTURE IN PROGRESS FOR 5 DAYS    Report Status PENDING  Incomplete    Radiology Reports Dg Chest 1 View  Result Date: 07/06/2018 CLINICAL DATA:  Preop.  Fever EXAM: CHEST  1 VIEW COMPARISON:  None. FINDINGS: Mild cardiomegaly. No confluent airspace opacities, effusions or edema. No acute bony abnormality. IMPRESSION: Mild cardiomegaly.  No active disease. Electronically Signed   By: Rolm Baptise M.D.   On: 07/06/2018 19:54   Ct Femur Left W Contrast  Result Date: 07/10/2018 CLINICAL DATA:  Wound infection. Left thigh abscess versus cellulitis. EXAM: CT OF THE LOWER LEFT EXTREMITY WITH CONTRAST TECHNIQUE: Multidetector CT imaging of the left thigh was performed according to the standard protocol following intravenous contrast administration. COMPARISON:  CT and ultrasound 07/06/2018. CONTRAST:  150mL OMNIPAQUE IOHEXOL 300 MG/ML  SOLN FINDINGS: Bones/Joint/Cartilage No evidence of  acute fracture, dislocation or bone destruction. Stable subchondral cyst formation in the left acetabulum. Mild left hip and left knee degenerative changes. No significant hip or joint effusion. Ligaments Suboptimally evaluated by CT. The cruciate ligaments appear intact at the knee. Muscles and Tendons The left thigh muscles and tendons appear normal. The extensor mechanism is intact at the knee. Soft tissues Stable probable reactive left pelvic and inguinal adenopathy, measuring up to 19 mm short axis on image 122/8. There is diffuse dermal thickening and subcutaneous edema medially in the proximal left thigh. The previously demonstrated complex fluid  collections are very similar to the previous study, although there is 1 component which appears slightly larger, measuring 6.9 x 4.5 cm on image 163/7 (previously 5.9 x 3.2 cm). The superior to inferior extent is similar, approximately 17 cm. The collections appear unchanged without peripheral enhancement, emphysema or foreign body. There are no new fluid collections. There is stable mild subcutaneous edema laterally in the distal thigh. IMPRESSION: 1. Little change is seen from the prior study of 4 days ago. Findings consistent with persistent medial left thigh cellulitis. Complex subcutaneous fluid collections medially are similar in overall appearance, although one is slightly larger. These remain nonspecific, but could reflect early abscesses. They were visible on prior ultrasound and could be aspirated under ultrasound guidance. 2. No foreign body or soft tissue emphysema. No evidence of deep infection. 3. No evidence of osteomyelitis. Electronically Signed   By: Richardean Sale M.D.   On: 07/10/2018 11:43   Ct Femur Left W Contrast  Result Date: 07/06/2018 CLINICAL DATA:  Left thigh abscess. EXAM: CT OF THE LOWER RIGHT EXTREMITY WITH CONTRAST TECHNIQUE: Multidetector CT imaging of the lower right extremity was performed according to the standard protocol following intravenous contrast administration. COMPARISON:  None. CONTRAST:  180mL OMNIPAQUE IOHEXOL 300 MG/ML  SOLN FINDINGS: No fracture or other acute bony abnormality is noted. No definite abnormality seen in the visualized portion of the pelvis. Mildly enlarged left inguinal lymph nodes are noted which most likely are inflammatory in etiology. Stranding of the subcutaneous tissues of the medial and anterior aspect of the proximal left thigh is noted most consistent with cellulitis. There are multiple irregular fluid collections seen in this area which potentially could represent possible developing abscess or abscesses. IMPRESSION: Findings consistent  with cellulitis involving the subcutaneous tissues involving the medial and anterior aspect of the proximal left thigh. Multiple irregular low densities are noted within this area concerning for small fluid collections with ill-defined margins; potentially these may represent multiple developing abscesses. However, no definite wall enhancement is noted at this time. Electronically Signed   By: Marijo Conception, M.D.   On: 07/06/2018 20:11   US Venous Img Lower Unilateral Left  Result Date: 07/06/2018 CLINICAL DATA:  63 year old male with left lower extremity redness, swelling, cellulitis and possible abscess. EXAM: LEFT LOWER EXTREMITY VENOUS DOPPLER ULTRASOUND TECHNIQUE: Gray-scale sonography with graded compression, as well as color Doppler and duplex ultrasound were performed to evaluate the lower extremity deep venous systems from the level of the common femoral vein and including the common femoral, femoral, profunda femoral, popliteal and calf veins including the posterior tibial, peroneal and gastrocnemius veins when visible. The superficial great saphenous vein was also interrogated. Spectral Doppler was utilized to evaluate flow at rest and with distal augmentation maneuvers in the common femoral, femoral and popliteal veins. COMPARISON:  None. FINDINGS: Contralateral Common Femoral Vein: Respiratory phasicity is normal and symmetric with the symptomatic side. No  evidence of thrombus. Normal compressibility. Common Femoral Vein: No evidence of thrombus. Normal compressibility, respiratory phasicity and response to augmentation. Saphenofemoral Junction: No evidence of thrombus. Normal compressibility and flow on color Doppler imaging. Profunda Femoral Vein: No evidence of thrombus. Normal compressibility and flow on color Doppler imaging. Femoral Vein: No evidence of thrombus. Normal compressibility, respiratory phasicity and response to augmentation. Popliteal Vein: No evidence of thrombus. Normal  compressibility, respiratory phasicity and response to augmentation. Calf Veins: No evidence of thrombus. Normal compressibility and flow on color Doppler imaging. Superficial Great Saphenous Vein: No evidence of thrombus. Normal compressibility. Venous Reflux:  None. Other Findings: Complex fluid collection in the medial upper thigh in the region of the inguinal crease measures approximately 11.5 x 3.0 by 7.8 cm. No evidence of internal vascularity on color Doppler imaging to suggest a solid mass. IMPRESSION: No evidence of deep venous thrombosis. Complex fluid collection in the medial upper thigh measures approximately 11.5 x 3.0 x 7.8 cm. Differential considerations include subcutaneous abscess versus hematoma. Electronically Signed   By: Jacqulynn Cadet M.D.   On: 07/06/2018 16:58   [place] Ekg Site Rite  Result Date: 07/12/2018 If Site Rite image not attached, placement could not be confirmed due to current cardiac rhythm.    CBC Recent Labs  Lab 07/09/18 0544 07/10/18 0350 07/11/18 0445 07/12/18 0406 07/13/18 0255  WBC 11.6* 11.5* 12.7* 14.5* 9.3  HGB 12.7* 13.6 13.3 13.2 12.2*  HCT 39.4 41.8 41.1 41.1 38.0*  PLT 220 235 256 295 289  MCV 96.3 96.8 96.3 96.9 96.9  MCH 31.1 31.5 31.1 31.1 31.1  MCHC 32.2 32.5 32.4 32.1 32.1  RDW 12.4 12.3 12.5 12.4 12.7    Chemistries  Recent Labs  Lab 07/07/18 0703 07/11/18 0445 07/13/18 0255  NA 138  --  136  K 4.0  --  4.4  CL 103  --  100  CO2 30  --  30  GLUCOSE 125*  --  139*  BUN 16  --  20  CREATININE 1.04 0.92 0.98  CALCIUM 8.5*  --  8.1*   ------------------------------------------------------------------------------------------------------------------ estimated creatinine clearance is 108.4 mL/min (by C-G formula based on SCr of 0.98 mg/dL). ------------------------------------------------------------------------------------------------------------------ No results for input(s): HGBA1C in the last 72  hours. ------------------------------------------------------------------------------------------------------------------ No results for input(s): CHOL, HDL, LDLCALC, TRIG, CHOLHDL, LDLDIRECT in the last 72 hours. ------------------------------------------------------------------------------------------------------------------ No results for input(s): TSH, T4TOTAL, T3FREE, THYROIDAB in the last 72 hours.  Invalid input(s): FREET3 ------------------------------------------------------------------------------------------------------------------ No results for input(s): VITAMINB12, FOLATE, FERRITIN, TIBC, IRON, RETICCTPCT in the last 72 hours.  Coagulation profile No results for input(s): INR, PROTIME in the last 168 hours.  No results for input(s): DDIMER in the last 72 hours.  Cardiac Enzymes No results for input(s): CKMB, TROPONINI, MYOGLOBIN in the last 168 hours.  Invalid input(s): CK ------------------------------------------------------------------------------------------------------------------ Invalid input(s): POCBNP    Assessment & Plan   Left thigh abscess/necrotizing soft tissue infection-leukocytosis improved today -s/p I&D of complex left thigh abscess, excisional debridement of subcutaneous tissue and muscle, and wound VAC placement 07/11/18 -Surgery following- plan for wound vac change and possible repeat debridement 2/21 -Follow-up intraoperative wound cultures -ID consult -Continue vancomycin, ceftriaxone, and clindamycin -Pain control  Chronic hepatitis C infection- patient states he has been diagnosed with hepatitis C in the past.  Has never been treated.   -Hep C antibody positive -Hep C RNA quant is high -Needs to follow-up with GI as an outpatient  Tobacco abuse -Tobacco cessation counseling performed this admission -Nicotine patch  Code Status Orders  (From admission, onward)         Start     Ordered   07/06/18 2130  Full code  Continuous      07/06/18 2132        Code Status History    This patient has a current code status but no historical code status.      Consults surgery, ID  DVT Prophylaxis  Lovenox  Lab Results  Component Value Date   PLT 289 07/13/2018   Time Spent in minutes 35 min Greater than 50% of time spent in care coordination and counseling patient regarding the condition and plan of care.   Berna Spare Mayo M.D on 07/13/2018 at 4:53 PM  Between 7am to 6pm - Pager [phone]  After 6pm go to [email or web address] - Proofreader  [employer]   Office  [phone]
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
## 2018-07-13 NOTE — Progress Notes (Signed)
[place] Hospital Day(s): 7.   Post op day(s): 2 Days Post-Op.   Interval History: Patient seen and examined, no acute events or new complaints overnight. Patient reports some discomfort in his left upper thigh but endorse improved swelling and erythema of the area. No reports of fever or chills. Tolerating a diet. He is anxious and wants to go outside. Plans for wound vac change tomorrow in the OR with Dr Hampton Abbot again reviewed.   Review of Systems:  Constitutional: denies fever, chills  Integumentary: denies any other rashes or skin discolorations except left upper medial thigh abscess/wound vac   Vital signs in last 24 hours: [min-max] current  Temp:  [97.8 F (36.6 C)-98.5 F (36.9 C)] 98.5 F (36.9 C) (02/20 0751) Pulse Rate:  [73-90] 90 (02/20 0751) Resp:  [20] 20 (02/19 2305) BP: (128-160)/(70-95) 148/95 (02/20 0751) SpO2:  [94 %-96 %] 96 % (02/20 0751)     Height: 5\' 9"  (175.3 cm) Weight: 136 kg BMI (Calculated): 44.26   Intake/Output this shift:  Total I/O In: 10 [I.V.:10] Out: -     Physical Exam:  Constitutional: alert, cooperative and no distress  Respiratory: breathing non-labored at rest  Integumentary: wound vacuum to left upper medial thigh with good seal, minimal surrounding erythema which is improved.   Labs:  CBC Latest Ref Rng & Units 07/13/2018 07/12/2018 07/11/2018  WBC 4.0 - 10.5 K/uL 9.3 14.5(H) 12.7(H)  Hemoglobin 13.0 - 17.0 g/dL 12.2(L) 13.2 13.3  Hematocrit 39.0 - 52.0 % 38.0(L) 41.1 41.1  Platelets 150 - 400 K/uL 289 295 256   CMP Latest Ref Rng & Units 07/13/2018 07/11/2018 07/07/2018  Glucose 70 - 99 mg/dL 139(H) - 125(H)  BUN 8 - 23 mg/dL 20 - 16  Creatinine 0.61 - 1.24 mg/dL 0.98 0.92 1.04  Sodium 135 - 145 mmol/L 136 - 138  Potassium 3.5 - 5.1 mmol/L 4.4 - 4.0  Chloride 98 - 111 mmol/L 100 - 103  CO2 22 - 32 mmol/L 30 - 30  Calcium 8.9 - 10.3 mg/dL 8.1(L) - 8.5(L)  Total Protein 6.5 - 8.1 g/dL  - - -  Total Bilirubin 0.3 - 1.2 mg/dL - - -  Alkaline Phos 38 - 126 U/L - - -  AST 15 - 41 U/L - - -  ALT 0 - 44 U/L - - -    Imaging studies: No new pertinent imaging studies   Assessment/Plan: (ICD-10's: L03.116) 63 y.o. male with improved pain and leukocytosis 2 Days Post-Op I&D and wound vac placement for left upper thigh abscess, complicated by pertinent comorbidities including obesity and current tobacco abuse (smoking).   - Continue diet (NPO after midnight)   - Continue IV ABx, follow up cultures             - Monitor wound vac output, leukocytosis              - Will plan to take back to the OR on Friday (02/21) for formal wound vac change with Dr Hampton Abbot, pending OR/Anesthesia availability             - Medical management per primary team   - Appreciate ID involvement, recommendations pending culture results.   All of the above findings and recommendations were discussed with the patient, and the medical team, and all of patient's questions were answered to his expressed satisfaction.  -- Edison Simon, PA-C [facility] 07/13/2018, 9:48 AM [phone] M-F: 7am - 4pm

## 2018-07-13 NOTE — Anesthesia Preprocedure Evaluation (Addendum)
Anesthesia Evaluation  Patient identified by MRN, date of birth, ID band Patient awake    Reviewed: Allergy & Precautions, H&P , NPO status , Patient's Chart, lab work & pertinent test results  Airway Mallampati: III       Dental  (+) Poor Dentition, Missing   Pulmonary neg pulmonary ROS, neg sleep apnea, neg COPD, Current Smoker,    Pulmonary exam normal breath sounds clear to auscultation- rhonchi (-) wheezing      Cardiovascular Exercise Tolerance: Good (-) hypertension(-) CAD, (-) Past MI, (-) Cardiac Stents and (-) CABG negative cardio ROS Normal cardiovascular exam Rhythm:Regular Rate:Normal - Systolic murmurs and - Diastolic murmurs    Neuro/Psych neg Seizures negative neurological ROS  negative psych ROS   GI/Hepatic negative GI ROS, (+) Hepatitis -, C  Endo/Other  negative endocrine ROSneg diabetes  Renal/GU negative Renal ROS  negative genitourinary   Musculoskeletal  (+) Arthritis , Osteoarthritis,    Abdominal Normal abdominal exam  (+) + obese,   Peds negative pediatric ROS (+)  Hematology negative hematology ROS (+)   Anesthesia Other Findings   Reproductive/Obstetrics negative OB ROS                           Anesthesia Physical  Anesthesia Plan  ASA: III  Anesthesia Plan: General and General ETT   Post-op Pain Management:    Induction: Intravenous  PONV Risk Score and Plan: 0 and Ondansetron  Airway Management Planned: Oral ETT  Additional Equipment:   Intra-op Plan:   Post-operative Plan: Extubation in OR  Informed Consent: I have reviewed the patients History and Physical, chart, labs and discussed the procedure including the risks, benefits and alternatives for the proposed anesthesia with the patient or authorized representative who has indicated his/her understanding and acceptance.       Plan Discussed with:   Anesthesia Plan Comments:         Anesthesia Quick Evaluation

## 2018-07-13 NOTE — Consult Note (Addendum)
Pharmacy Antibiotic Note  Corey James is a 63 y.o. male admitted on 07/06/2018 with cellulitis/Left thigh necrotizing? soft tissue infection.   Pharmacy has been consulted for Vancomycin dosing. ID following Patient also on Clindamycin and Ceftriaxone  Plan: Loading dose: Vancomycin 2500 mg once (given as two doses 1000mg  plus 1500 mg) Vancomycin 1250 mg IV Q 12 hrs. Goal AUC 400-550. Expected AUC: 509.4 SCr used: 0.95  2/20:  S/p I&D - Wound CX= rare GPC, NGTD.  ID MD note- unsure if pt will need IV abx at discharge at this time. Will order Vancomycin levels to evaluate dose. Peak 2 hours after end of 11:00 infusion at 1430 today and Trough prior to 2300 dose at 2230.    Height: 5\' 9"  (175.3 cm) Weight: 299 lb 13.2 oz (136 kg) IBW/kg (Calculated) : 70.7  Temp (24hrs), Avg:98.1 F (36.7 C), Min:97.8 F (36.6 C), Max:98.5 F (36.9 C)  Recent Labs  Lab 07/06/18 1645 07/06/18 2134 07/07/18 0703 07/09/18 0544 07/10/18 0350 07/11/18 0445 07/12/18 0406 07/13/18 0255  WBC 11.4*  --  10.7* 11.6* 11.5* 12.7* 14.5* 9.3  CREATININE 0.95  --  1.04  --   --  0.92  --  0.98  LATICACIDVEN 1.6 1.0  --   --   --   --   --   --     Estimated Creatinine Clearance: 108.4 mL/min (by C-G formula based on SCr of 0.98 mg/dL).    Allergies  Allergen Reactions  . Penicillins Itching and Rash    Antimicrobials this admission: Metronidazole ordered 2/13 but not given per [facility] Imipenem/Cilastin 2/13 >> 2/17 Vancomycin  2/13 >> Clindamycin 2/14 >> Ceftriaxone 2/17 >>  Dose adjustments this admission:   Microbiology results: 2/13 BCx: NG MRSA PCR neg. 2/18 Wound cx: rare GPC, pending  Thank you for allowing pharmacy to be a part of this patient's care.  Noralee Space, PharmD Clinical Pharmacist 07/13/2018 10:00 AM    02/21 Addendum: 2/20 2200 vanc trough 12. Will leave as is for now pending determination of need for outpatient IV abx.  Sim Boast, PharmD, BCPS   07/14/18 4:54 AM

## 2018-07-14 ENCOUNTER — Encounter
Admission: EM | Disposition: A | Discharge status: Home / Self Care | Payer: Self-pay | Source: Home / Self Care | Attending: Internal Medicine

## 2018-07-14 ENCOUNTER — Inpatient Hospital Stay: Payer: Medicaid Other | Admitting: Anesthesiology

## 2018-07-14 ENCOUNTER — Encounter: Payer: Self-pay | Admitting: Anesthesiology

## 2018-07-14 DIAGNOSIS — L02416 Cutaneous abscess of left lower limb: Secondary | ICD-10-CM

## 2018-07-14 DIAGNOSIS — B1920 Unspecified viral hepatitis C without hepatic coma: Secondary | ICD-10-CM

## 2018-07-14 DIAGNOSIS — M5431 Sciatica, right side: Secondary | ICD-10-CM

## 2018-07-14 HISTORY — PX: WOUND DEBRIDEMENT: SHX247

## 2018-07-14 HISTORY — PX: APPLICATION OF WOUND VAC: SHX5189

## 2018-07-14 LAB — URINE DRUG SCREEN, QUALITATIVE (ARMC ONLY)
Amphetamines, Ur Screen: NOT DETECTED
Barbiturates, Ur Screen: NOT DETECTED
Benzodiazepine, Ur Scrn: POSITIVE — AB
Cannabinoid 50 Ng, Ur ~~LOC~~: NOT DETECTED
Cocaine Metabolite,Ur ~~LOC~~: NOT DETECTED
MDMA (Ecstasy)Ur Screen: NOT DETECTED
Methadone Scn, Ur: NOT DETECTED
Opiate, Ur Screen: POSITIVE — AB
Phencyclidine (PCP) Ur S: NOT DETECTED
Tricyclic, Ur Screen: NOT DETECTED

## 2018-07-14 LAB — HEMOGLOBIN A1C
HEMOGLOBIN A1C: 6 % — AB (ref 4.8–5.6)
Mean Plasma Glucose: 126 mg/dL

## 2018-07-14 LAB — CBC
HCT: 38 % — ABNORMAL LOW (ref 39.0–52.0)
Hemoglobin: 12.2 g/dL — ABNORMAL LOW (ref 13.0–17.0)
MCH: 31.4 pg (ref 26.0–34.0)
MCHC: 32.1 g/dL (ref 30.0–36.0)
MCV: 97.7 fL (ref 80.0–100.0)
PLATELETS: 292 10*3/uL (ref 150–400)
RBC: 3.89 MIL/uL — ABNORMAL LOW (ref 4.22–5.81)
RDW: 12.8 % (ref 11.5–15.5)
WBC: 9.8 10*3/uL (ref 4.0–10.5)
nRBC: 0 % (ref 0.0–0.2)

## 2018-07-14 SURGERY — APPLICATION, WOUND VAC
Anesthesia: General | Laterality: Left

## 2018-07-14 MED ORDER — FENTANYL CITRATE (PF) 100 MCG/2ML IJ SOLN: 25.0000 ug | INTRAMUSCULAR | Status: DC | PRN

## 2018-07-14 MED ORDER — LACTATED RINGERS IV SOLN
INTRAVENOUS | Status: DC | PRN
  Administered 2018-07-14: 10:00:00 via INTRAVENOUS

## 2018-07-14 MED ORDER — MIDAZOLAM HCL 2 MG/2ML IJ SOLN
INTRAMUSCULAR | Status: AC
  Filled 2018-07-14: qty 2

## 2018-07-14 MED ORDER — PHENYLEPHRINE HCL 10 MG/ML IJ SOLN
INTRAMUSCULAR | Status: DC | PRN
  Administered 2018-07-14: 100 ug via INTRAVENOUS

## 2018-07-14 MED ORDER — ONDANSETRON HCL 4 MG/2ML IJ SOLN
INTRAMUSCULAR | Status: DC | PRN
  Administered 2018-07-14: 4 mg via INTRAVENOUS

## 2018-07-14 MED ORDER — HYDROMORPHONE HCL 1 MG/ML IJ SOLN: 0.5000 mg | INTRAMUSCULAR | Status: DC | PRN

## 2018-07-14 MED ORDER — MIDAZOLAM HCL 2 MG/2ML IJ SOLN
INTRAMUSCULAR | Status: DC | PRN
  Administered 2018-07-14: 2 mg via INTRAVENOUS

## 2018-07-14 MED ORDER — LIDOCAINE HCL (CARDIAC) PF 100 MG/5ML IV SOSY
PREFILLED_SYRINGE | INTRAVENOUS | Status: DC | PRN
  Administered 2018-07-14: 100 mg via INTRAVENOUS

## 2018-07-14 MED ORDER — FENTANYL CITRATE (PF) 100 MCG/2ML IJ SOLN
INTRAMUSCULAR | Status: AC
  Filled 2018-07-14: qty 2

## 2018-07-14 MED ORDER — SEVOFLURANE IN SOLN
RESPIRATORY_TRACT | Status: AC
  Filled 2018-07-14: qty 250

## 2018-07-14 MED ORDER — OXYCODONE HCL 5 MG/5ML PO SOLN: 5.0000 mg | Freq: Once | ORAL | Status: DC | PRN

## 2018-07-14 MED ORDER — PROMETHAZINE HCL 25 MG/ML IJ SOLN: 6.2500 mg | INTRAMUSCULAR | Status: DC | PRN

## 2018-07-14 MED ORDER — EPHEDRINE SULFATE 50 MG/ML IJ SOLN
INTRAMUSCULAR | Status: AC
  Filled 2018-07-14: qty 1

## 2018-07-14 MED ORDER — ACETAMINOPHEN 500 MG PO TABS: 1000.0000 mg | ORAL_TABLET | Freq: Four times a day (QID) | ORAL | Status: DC | PRN

## 2018-07-14 MED ORDER — SUCCINYLCHOLINE CHLORIDE 20 MG/ML IJ SOLN
INTRAMUSCULAR | Status: DC | PRN
  Administered 2018-07-14: 160 mg via INTRAVENOUS

## 2018-07-14 MED ORDER — DEXAMETHASONE SODIUM PHOSPHATE 4 MG/ML IJ SOLN
INTRAMUSCULAR | Status: AC
  Filled 2018-07-14: qty 1

## 2018-07-14 MED ORDER — FENTANYL CITRATE (PF) 100 MCG/2ML IJ SOLN
INTRAMUSCULAR | Status: DC | PRN
  Administered 2018-07-14: 50 ug via INTRAVENOUS

## 2018-07-14 MED ORDER — PROPOFOL 10 MG/ML IV BOLUS
INTRAVENOUS | Status: DC | PRN
  Administered 2018-07-14: 200 mg via INTRAVENOUS

## 2018-07-14 MED ORDER — OXYCODONE HCL 5 MG PO TABS: 5.0000 mg | ORAL_TABLET | Freq: Once | ORAL | Status: DC | PRN

## 2018-07-14 MED ORDER — OXYCODONE HCL 5 MG PO TABS
5.0000 mg | ORAL_TABLET | ORAL | Status: DC | PRN
  Administered 2018-07-14 – 2018-07-15 (×3): 10 mg via ORAL
  Filled 2018-07-14 (×3): qty 2

## 2018-07-14 MED ORDER — DEXAMETHASONE SODIUM PHOSPHATE 10 MG/ML IJ SOLN
INTRAMUSCULAR | Status: DC | PRN
  Administered 2018-07-14: 5 mg via INTRAVENOUS

## 2018-07-14 MED ORDER — LIDOCAINE HCL (PF) 2 % IJ SOLN
INTRAMUSCULAR | Status: AC
  Filled 2018-07-14: qty 10

## 2018-07-14 MED ORDER — ONDANSETRON HCL 4 MG/2ML IJ SOLN
INTRAMUSCULAR | Status: AC
  Filled 2018-07-14: qty 2

## 2018-07-14 MED ORDER — MEPERIDINE HCL 50 MG/ML IJ SOLN: 6.2500 mg | INTRAMUSCULAR | Status: DC | PRN

## 2018-07-14 MED ORDER — KETOROLAC TROMETHAMINE 30 MG/ML IJ SOLN
30.0000 mg | Freq: Four times a day (QID) | INTRAMUSCULAR | Status: DC
  Administered 2018-07-14 – 2018-07-15 (×3): 30 mg via INTRAVENOUS
  Filled 2018-07-14 (×4): qty 1

## 2018-07-14 SURGICAL SUPPLY — 19 items
BNDG GAUZE 4.5X4.1 6PLY STRL (MISCELLANEOUS) IMPLANT
CHLORAPREP W/TINT 26ML (MISCELLANEOUS) ×3 IMPLANT
COVER WAND RF STERILE (DRAPES) ×3 IMPLANT
DRAIN PENROSE 1/4X12 LTX (DRAIN) IMPLANT
DRSG VAC ATS LRG SENSATRAC (GAUZE/BANDAGES/DRESSINGS) ×3 IMPLANT
ELECT REM PT RETURN 9FT ADLT (ELECTROSURGICAL) ×3
ELECTRODE REM PT RTRN 9FT ADLT (ELECTROSURGICAL) ×1 IMPLANT
GAUZE SPONGE 4X4 12PLY STRL (GAUZE/BANDAGES/DRESSINGS) IMPLANT
GLOVE SURG SYN 7.0 (GLOVE) ×9 IMPLANT
GLOVE SURG SYN 7.5  E (GLOVE) ×6
GLOVE SURG SYN 7.5 E (GLOVE) ×3 IMPLANT
GOWN STRL REUS W/ TWL LRG LVL3 (GOWN DISPOSABLE) ×3 IMPLANT
GOWN STRL REUS W/TWL LRG LVL3 (GOWN DISPOSABLE) ×6
KIT TURNOVER KIT A (KITS) ×3 IMPLANT
LABEL OR SOLS (LABEL) ×3 IMPLANT
NS IRRIG 500ML POUR BTL (IV SOLUTION) ×3 IMPLANT
PACK BASIN MINOR ARMC (MISCELLANEOUS) ×3 IMPLANT
SWAB CULTURE AMIES ANAERIB BLU (MISCELLANEOUS) IMPLANT
WND VAC CANISTER 500ML (MISCELLANEOUS) ×3 IMPLANT

## 2018-07-14 NOTE — Anesthesia Procedure Notes (Signed)
Procedure Name: Intubation Date/Time: 07/14/2018 9:58 AM Performed by: Justus Memory, CRNA Pre-anesthesia Checklist: Patient identified, Patient being monitored, Timeout performed, Emergency Drugs available and Suction available Patient Re-evaluated:Patient Re-evaluated prior to induction Oxygen Delivery Method: Circle system utilized Preoxygenation: Pre-oxygenation with 100% oxygen Induction Type: IV induction Ventilation: Mask ventilation without difficulty Laryngoscope Size: 3 and McGraph Grade View: Grade I Tube type: Oral Tube size: 7.0 mm Number of attempts: 1 Airway Equipment and Method: Stylet Placement Confirmation: ETT inserted through vocal cords under direct vision,  positive ETCO2 and breath sounds checked- equal and bilateral Secured at: 21 cm Tube secured with: Tape Dental Injury: Teeth and Oropharynx as per pre-operative assessment  Difficulty Due To: Difficulty was anticipated, Difficult Airway- due to large tongue, Difficult Airway- due to reduced neck mobility, Difficult Airway- due to immobile epiglottis and Difficult Airway- due to anterior larynx Future Recommendations: Recommend- induction with short-acting agent, and alternative techniques readily available

## 2018-07-14 NOTE — Transfer of Care (Signed)
Immediate Anesthesia Transfer of Care Note  Patient: Corey James  Procedure(s) Performed: WOUND VAC CHANGE (Left ) POSSIBLE DEBRIDEMENT WOUND-LEFT THIGH (Left )  Patient Location: PACU  Anesthesia Type:General  Level of Consciousness: sedated  Airway & Oxygen Therapy: Patient Spontanous Breathing and Patient connected to face mask oxygen  Post-op Assessment: Report given to RN and Post -op Vital signs reviewed and stable  Post vital signs: Reviewed and stable  Last Vitals:  Vitals Value Taken Time  BP 160/89 07/14/2018 11:01 AM  Temp 36.2 C 07/14/2018 11:01 AM  Pulse 92 07/14/2018 11:07 AM  Resp 21 07/14/2018 11:07 AM  SpO2 96 % 07/14/2018 11:07 AM  Vitals shown include unvalidated device data.  Last Pain:  Vitals:   07/14/18 1101  TempSrc:   PainSc: 0-No pain      Patients Stated Pain Goal: 3 (55/73/22 0254)  Complications: No apparent anesthesia complications

## 2018-07-14 NOTE — Evaluation (Signed)
Physical Therapy Evaluation Patient Details Name: Corey James MRN: [id number] DOB: Nov 25, 1955 Today's Date: 07/14/2018   History of Present Illness  Corey James is a 63yo male who comes to [facility] on 2/13 c worsening of Left goin blister, found to have abscess. Pt underwent Lt wound I&D adn vac placement on 2/21. Pt has been limited in mobility since August 2019 2/2 a fall with subsequent Rt sciatica problems (pain and weakness.)  Clinical Impression  Pt admitted with above diagnosis. Pt currently with functional limitations due to the deficits listed below (see "PT Problem List"). Upon entry, pt in bed, no family/caregiver present. The pt is awake and agreeable to participate.  The pt is alert and oriented x3, pleasant, conversational, and following simple commands consistently. Pt assisted with AMB around the unit without assistive device, no LOB. Pt also assisted with voiding, and getting dressed. Functional mobility assessment demonstrates mildly increased effort/time requirements, fair tolerance, but no frank need for physical assistance, whereas the patient performed these at a higher level of independence PTA. Pt will benefit from skilled PT intervention to increase independence and safety with basic mobility in preparation for discharge to the venue listed below.       Follow Up Recommendations Home health PT    Equipment Recommendations  None recommended by PT    Recommendations for Other Services       Precautions / Restrictions Precautions Precautions: Fall Restrictions Weight Bearing Restrictions: No      Mobility  Bed Mobility Overal bed mobility: Modified Independent                Transfers Overall transfer level: Needs assistance Equipment used: None Transfers: Sit to/from Stand Sit to Stand: Supervision         General transfer comment: VC to be mindful of IV line and VAC line   Ambulation/Gait Ambulation/Gait assistance: Supervision Gait Distance  (Feet): 240 Feet Assistive device: None   Gait velocity: 0.8m/s    General Gait Details: some asymmetry, largely to ongoing sciatica pain, pt reports some weakness compared to baseline.   Stairs            Wheelchair Mobility    Modified Rankin (Stroke Patients Only)       Balance                                             Pertinent Vitals/Pain Pain Assessment: Faces Faces Pain Scale: Hurts a little bit Pain Location: Left groin (pain absent upon entry, gets worse with AMB)  Pain Intervention(s): Limited activity within patient's tolerance;Monitored during session;Premedicated before session    Home Living Family/patient expects to be discharged to:: Private residence Living Arrangements: Spouse/significant other(girlfriend, girlfriends father) Available Help at Discharge: Family Type of Home: Mobile home Home Access: Ramped entrance     Home Layout: One level Home Equipment: None      Prior Function Level of Independence: Needs assistance   Gait / Transfers Assistance Needed: limited community distances since May, fell off  abuilding c subsequent RLE sciatica pain/weakness  ADL's / Homemaking Assistance Needed: independent         Hand Dominance   Dominant Hand: Right    Extremity/Trunk Assessment                Communication   Communication: No difficulties  Cognition Arousal/Alertness: Awake/alert Behavior During Therapy: [facility] for  tasks assessed/performed Overall Cognitive Status: Within Functional Limits for tasks assessed                                        General Comments      Exercises     Assessment/Plan    PT Assessment Patient needs continued PT services  PT Problem List Decreased activity tolerance;Decreased balance;Decreased mobility;Decreased strength       PT Treatment Interventions Therapeutic exercise;Functional mobility training;Therapeutic activities;Gait training    PT  Goals (Current goals can be found in the Care Plan section)  Acute Rehab PT Goals Patient Stated Goal: regqain strength, improve sciatic pain PT Goal Formulation: With patient Time For Goal Achievement: 07/28/18 Potential to Achieve Goals: Good    Frequency Min 2X/week   Barriers to discharge        Co-evaluation               AM-PAC PT "6 Clicks" Mobility  Outcome Measure Help needed turning from your back to your side while in a flat bed without using bedrails?: None Help needed moving from lying on your back to sitting on the side of a flat bed without using bedrails?: A Little Help needed moving to and from a bed to a chair (including a wheelchair)?: A Little Help needed standing up from a chair using your arms (e.g., wheelchair or bedside chair)?: A Little Help needed to walk in hospital room?: A Little Help needed climbing 3-5 steps with a railing? : A Little 6 Click Score: 19    End of Session Equipment Utilized During Treatment: Gait belt;Other (comment)(VAC) Activity Tolerance: Patient tolerated treatment well;Patient limited by fatigue Patient left: in chair;with call bell/phone within reach Nurse Communication: Mobility status PT Visit Diagnosis: Other abnormalities of gait and mobility (R26.89);Muscle weakness (generalized) (M62.81);Difficulty in walking, not elsewhere classified (R26.2)    Time: 5284-1324 PT Time Calculation (min) (ACUTE ONLY): 20 min   Charges:   PT Evaluation $PT Eval Moderate Complexity: 1 Mod PT Treatments $Therapeutic Exercise: 8-22 mins       1:31 PM, 07/14/18 Etta Grandchild, PT, DPT Physical Therapist - [facility]  [phone] ([place])    Amariah Kierstead C 07/14/2018, 1:30 PM

## 2018-07-14 NOTE — Progress Notes (Signed)
Left thigh wound 16 cm length, 3 cm width, 10 cm depth.

## 2018-07-14 NOTE — Progress Notes (Signed)
07/14/2018  Subjective: Patient is POD#3 s/p I&D of left thigh abscess for necrotizing wound infection.  Patient's wound vac came off last night and a wet to dry dressing was placed.  No other events overnight.  Patient denies any significant pain.  WBC is normal and continues on clinda, vanco, and ctx.  Vital signs: Temp:  [97 F (36.1 C)-98.5 F (36.9 C)] 97 F (36.1 C) (02/21 0918) Pulse Rate:  [60-97] 60 (02/21 0918) Resp:  [18-20] 18 (02/21 0918) BP: (123-170)/(54-96) 170/61 (02/21 0918) SpO2:  [95 %-98 %] 96 % (02/21 0918) Weight:  [381 kg] 136 kg (02/21 8299)   Intake/Output: 02/20 0701 - 02/21 0700 In: 1020 [P.O.:660; I.V.:10; IV Piggyback:350] Out: 1100 [Urine:1100] Last BM Date: 07/12/18  Physical Exam: Constitutional:  No acute distress  Skin:  Left thigh wound with some residual erythema, mostly in dependent portion of wound.  Wound edges are clean, with serosanguinous fluid at wound bed.  No purulence or necrotic areas.  Labs:  Recent Labs    07/13/18 0255 07/14/18 0546  WBC 9.3 9.8  HGB 12.2* 12.2*  HCT 38.0* 38.0*  PLT 289 292   Recent Labs    07/13/18 0255  NA 136  K 4.4  CL 100  CO2 30  GLUCOSE 139*  BUN 20  CREATININE 0.98  CALCIUM 8.1*   No results for input(s): LABPROT, INR in the last 72 hours.  Imaging: No results found.  Assessment/Plan: This is a 63 y.o. male s/p I&D of left thigh abscess and necrotizing infection.  --Will take back to OR today for wound washout and wound vac placement.   --Discussed with patient that the home wound vac machine is significantly smaller and more portable compared to the one in hospital, so mobility should not be an issue. --Picture of wound taken by Dr. Brett Albino and placed in his chart.  Will take new picture in OR when wound better exposed.   Melvyn Neth, [facility]

## 2018-07-14 NOTE — Progress Notes (Signed)
Notified Dr. Brett Albino of patient's request to leave the unit with his wife to get fresh air. Received verbal consent from Dr. Brett Albino for patient to do so but is not to go outside and smoke.

## 2018-07-14 NOTE — Care Management (Signed)
Contacted Dr. Doy Hutching about accepting the patient as PCP for 3o days to order home health dressing changes if needed.  He has agreed to accept the patient for 30 days

## 2018-07-14 NOTE — Progress Notes (Addendum)
Corey James at [facility]                                                                                                                                                                                  Patient Demographics   Corey James, is a 63 y.o. male, DOB - [birth year], MHW:[id number]  Admit date - 07/06/2018   Admitting Physician Saundra Shelling, MD  Outpatient Primary MD for the patient is System, Provider Not In   LOS - 8  Subjective: Doing okay today.  States that his pain is little bit better.  He wishes he could go outside and is tired of being in the hospital.  His wound VAC fell off last night.  Patient states he does not know how this happened.  Review of Systems:   CONSTITUTIONAL: No documented fever. No fatigue, weakness. No weight gain, no weight loss.  EYES: No blurry or double vision.  ENT: No tinnitus. No postnasal drip. No redness of the oropharynx.  RESPIRATORY: No cough, no wheeze, no hemoptysis. No dyspnea.  CARDIOVASCULAR: No chest pain. No orthopnea. No palpitations. No syncope.  GASTROINTESTINAL: No nausea, no vomiting or diarrhea. No abdominal pain. No melena or hematochezia.  GENITOURINARY: No dysuria or hematuria.  ENDOCRINE: No polyuria or nocturia. No heat or cold intolerance.  HEMATOLOGY: No anemia. No bruising. No bleeding.  INTEGUMENTARY: +Left thigh redness MUSCULOSKELETAL: No arthritis. No swelling. No gout.  NEUROLOGIC: No numbness, tingling, or ataxia. No seizure-type activity.  PSYCHIATRIC: +anxiety. No insomnia. No ADD.    Vitals:   Vitals:   07/14/18 1101 07/14/18 1123 07/14/18 1125 07/14/18 1150  BP: (!) 160/89 134/76  (!) 156/92  Pulse:  80 83 92  Resp:  16 14 20   Temp: (!) 97.1 F (36.2 C)  (!) 97.2 F (36.2 C) 97.6 F (36.4 C)  TempSrc:    Oral  SpO2:  97% 99% 92%  Weight:      Height:        Wt Readings from Last 3 Encounters:  07/14/18 136 kg  07/03/18 136.1 kg  05/05/18 (!) 140.6 kg      Intake/Output Summary (Last 24 hours) at 07/14/2018 1204 Last data filed at 07/14/2018 1121 Gross per 24 hour  Intake 1270 ml  Output 1400 ml  Net -130 ml    Physical Exam:   GENERAL: Well-appearing in no apparent distress.  HEENT: Atraumatic, normocephalic. Extraocular muscles are intact. Pupils equal and reactive to light. Sclerae anicteric. No conjunctival injection. No oro-pharyngeal erythema.  NECK: Supple. There is no jugular venous distention. No bruits, no lymphadenopathy, no thyromegaly.  HEART: Regular rate and rhythm,. No murmurs, no rubs, no  clicks.  LUNGS: Clear to auscultation bilaterally. No rales or rhonchi. No wheezes.  ABDOMEN: Soft, flat, nontender, nondistended. Has good bowel sounds. No hepatosplenomegaly appreciated.  EXTREMITIES: + Left posterior/medial wound of the left thigh (see picture below).  No pedal edema, cyanosis, or clubbing. NEUROLOGIC: The patient is alert, awake, and oriented x 3 with no focal motor or sensory deficits appreciated bilaterally.  SKIN: Moist and warm with no rashes appreciated.  Psych: Not anxious, depressed      Antibiotics   Anti-infectives (From admission, onward)   Start     Dose/Rate Route Frequency Ordered Stop   07/10/18 1800  cefTRIAXone (ROCEPHIN) 2 g in sodium chloride 0.9 % 100 mL IVPB     2 g 200 mL/hr over 30 Minutes Intravenous Every 24 hours 07/10/18 1556     07/09/18 1800  imipenem-cilastatin (PRIMAXIN) 500 mg in sodium chloride 0.9 % 100 mL IVPB  Status:  Discontinued     500 mg 200 mL/hr over 30 Minutes Intravenous Every 6 hours 07/09/18 1509 07/10/18 1556   07/07/18 0800  vancomycin (VANCOCIN) 1,250 mg in sodium chloride 0.9 % 250 mL IVPB  Status:  Discontinued     1,250 mg 166.7 mL/hr over 90 Minutes Intravenous Every 12 hours 07/06/18 2031 07/14/18 1139   07/07/18 0000  clindamycin (CLEOCIN) IVPB 600 mg     600 mg 100 mL/hr over 30 Minutes Intravenous Every 6 hours 07/06/18 2245     07/06/18 2315   imipenem-cilastatin (PRIMAXIN) 500 mg in sodium chloride 0.9 % 100 mL IVPB  Status:  Discontinued     500 mg 200 mL/hr over 30 Minutes Intravenous Every 8 hours 07/06/18 2300 07/09/18 1509   07/06/18 2130  metroNIDAZOLE (FLAGYL) IVPB 500 mg  Status:  Discontinued     500 mg 100 mL/hr over 60 Minutes Intravenous Every 8 hours 07/06/18 2029 07/06/18 2245   07/06/18 2030  vancomycin (VANCOCIN) 1,500 mg in sodium chloride 0.9 % 500 mL IVPB     1,500 mg 250 mL/hr over 120 Minutes Intravenous  Once 07/06/18 2026 07/07/18 0015   07/06/18 1530  vancomycin (VANCOCIN) IVPB 1000 mg/200 mL premix     1,000 mg 200 mL/hr over 60 Minutes Intravenous  Once 07/06/18 1525 07/06/18 1810      Medications   Scheduled Meds: . ketorolac  30 mg Intravenous Q6H  . nicotine  21 mg Transdermal Daily  . sodium chloride flush  10-40 mL Intracatheter Q12H   Continuous Infusions: . sodium chloride 250 mL (07/12/18 0215)  . cefTRIAXone (ROCEPHIN)  IV 2 g (07/13/18 1812)  . clindamycin (CLEOCIN) IV 600 mg (07/14/18 0746)   PRN Meds:.sodium chloride, acetaminophen, HYDROmorphone (DILAUDID) injection, LORazepam, ondansetron **OR** ondansetron (ZOFRAN) IV, oxyCODONE, senna-docusate, sodium chloride flush   Data Review:   Micro Results Recent Results (from the past 240 hour(s))  Blood culture (routine x 2)     Status: None   Collection Time: 07/06/18  4:45 PM  Result Value Ref Range Status   Specimen Description BLOOD LEFT HAND  Final   Special Requests   Final    BOTTLES DRAWN AEROBIC AND ANAEROBIC Blood Culture adequate volume   Culture   Final    NO GROWTH 5 DAYS Performed at [facility], [address], [place], [place] [address]    Report Status 07/11/2018 FINAL  Final  Blood culture (routine x 2)     Status: None   Collection Time: 07/06/18  4:46 PM  Result Value Ref Range  Status   Specimen Description BLOOD LEFT ARM  Final   Special Requests   Final    BOTTLES DRAWN AEROBIC AND  ANAEROBIC Blood Culture results may not be optimal due to an excessive volume of blood received in culture bottles   Culture   Final    NO GROWTH 5 DAYS Performed at [facility], [address] [place], [place] [address]    Report Status 07/11/2018 FINAL  Final  MRSA PCR Screening     Status: None   Collection Time: 07/07/18  6:35 AM  Result Value Ref Range Status   MRSA by PCR NEGATIVE NEGATIVE Final    Comment:        The GeneXpert MRSA Assay (FDA approved for NASAL specimens only), is one component of a comprehensive MRSA colonization surveillance program. It is not intended to diagnose MRSA infection nor to guide or monitor treatment for MRSA infections. Performed at [facility], [address] [place], [place] [address]   Aerobic/Anaerobic Culture (surgical/deep wound)     Status: None (Preliminary result)   Collection Time: 07/11/18  2:20 PM  Result Value Ref Range Status   Specimen Description   Final    WOUND LEFT THIGH Performed at [facility], [address] [place], [place] [address]    Special Requests   Final    NONE Performed at [facility], [place], [place] [address]    Gram Stain   Final    ABUNDANT WBC PRESENT, PREDOMINANTLY PMN RARE GRAM POSITIVE COCCI Performed at [facility], [place] [address], [place], [place] [address]    Culture   Final    RARE STREPTOCOCCUS GROUP C CULTURE REINCUBATED FOR BETTER GROWTH NO ANAEROBES ISOLATED; CULTURE IN PROGRESS FOR 5 DAYS    Report Status PENDING  Incomplete  Aerobic/Anaerobic Culture (surgical/deep wound)     Status: None (Preliminary result)   Collection Time: 07/11/18  2:28 PM  Result Value Ref Range Status   Specimen Description   Final    WOUND LEFT THIGH Performed at [facility], [address], [place], [place] [address]    Special Requests   Final    NONE Performed at [facility], [address]  [place], [place] [address]    Gram Stain   Final    MODERATE WBC PRESENT, PREDOMINANTLY PMN RARE GRAM POSITIVE COCCI Performed at [facility], [place] [address], [place],  [address]    Culture   Final    RARE STREPTOCOCCUS GROUP C NO ANAEROBES ISOLATED; CULTURE IN PROGRESS FOR 5 DAYS    Report Status PENDING  Incomplete    Radiology Reports Dg Chest 1 View  Result Date: 07/06/2018 CLINICAL DATA:  Preop.  Fever EXAM: CHEST  1 VIEW COMPARISON:  None. FINDINGS: Mild cardiomegaly. No confluent airspace opacities, effusions or edema. No acute bony abnormality. IMPRESSION: Mild cardiomegaly.  No active disease. Electronically Signed   By: Rolm Baptise M.D.   On: 07/06/2018 19:54   Ct Femur Left W Contrast  Result Date: 07/10/2018 CLINICAL DATA:  Wound infection. Left thigh abscess versus cellulitis. EXAM: CT OF THE LOWER LEFT EXTREMITY WITH CONTRAST TECHNIQUE: Multidetector CT imaging of the left thigh was performed according to the standard protocol following intravenous contrast administration. COMPARISON:  CT and ultrasound 07/06/2018. CONTRAST:  129mL OMNIPAQUE IOHEXOL 300 MG/ML  SOLN FINDINGS: Bones/Joint/Cartilage No evidence of acute fracture, dislocation or bone destruction. Stable subchondral cyst formation in the  left acetabulum. Mild left hip and left knee degenerative changes. No significant hip or joint effusion. Ligaments Suboptimally evaluated by CT. The cruciate ligaments appear intact at the knee. Muscles and Tendons The left thigh muscles and tendons appear normal. The extensor mechanism is intact at the knee. Soft tissues Stable probable reactive left pelvic and inguinal adenopathy, measuring up to 19 mm short axis on image 122/8. There is diffuse dermal thickening and subcutaneous edema medially in the proximal left thigh. The previously demonstrated complex fluid collections are very similar to the previous study, although there is 1 component which appears slightly larger,  measuring 6.9 x 4.5 cm on image 163/7 (previously 5.9 x 3.2 cm). The superior to inferior extent is similar, approximately 17 cm. The collections appear unchanged without peripheral enhancement, emphysema or foreign body. There are no new fluid collections. There is stable mild subcutaneous edema laterally in the distal thigh. IMPRESSION: 1. Little change is seen from the prior study of 4 days ago. Findings consistent with persistent medial left thigh cellulitis. Complex subcutaneous fluid collections medially are similar in overall appearance, although one is slightly larger. These remain nonspecific, but could reflect early abscesses. They were visible on prior ultrasound and could be aspirated under ultrasound guidance. 2. No foreign body or soft tissue emphysema. No evidence of deep infection. 3. No evidence of osteomyelitis. Electronically Signed   By: Richardean Sale M.D.   On: 07/10/2018 11:43   Ct Femur Left W Contrast  Result Date: 07/06/2018 CLINICAL DATA:  Left thigh abscess. EXAM: CT OF THE LOWER RIGHT EXTREMITY WITH CONTRAST TECHNIQUE: Multidetector CT imaging of the lower right extremity was performed according to the standard protocol following intravenous contrast administration. COMPARISON:  None. CONTRAST:  128mL OMNIPAQUE IOHEXOL 300 MG/ML  SOLN FINDINGS: No fracture or other acute bony abnormality is noted. No definite abnormality seen in the visualized portion of the pelvis. Mildly enlarged left inguinal lymph nodes are noted which most likely are inflammatory in etiology. Stranding of the subcutaneous tissues of the medial and anterior aspect of the proximal left thigh is noted most consistent with cellulitis. There are multiple irregular fluid collections seen in this area which potentially could represent possible developing abscess or abscesses. IMPRESSION: Findings consistent with cellulitis involving the subcutaneous tissues involving the medial and anterior aspect of the proximal left  thigh. Multiple irregular low densities are noted within this area concerning for small fluid collections with ill-defined margins; potentially these may represent multiple developing abscesses. However, no definite wall enhancement is noted at this time. Electronically Signed   By: Marijo Conception, M.D.   On: 07/06/2018 20:11   US Venous Img Lower Unilateral Left  Result Date: 07/06/2018 CLINICAL DATA:  63 year old male with left lower extremity redness, swelling, cellulitis and possible abscess. EXAM: LEFT LOWER EXTREMITY VENOUS DOPPLER ULTRASOUND TECHNIQUE: Gray-scale sonography with graded compression, as well as color Doppler and duplex ultrasound were performed to evaluate the lower extremity deep venous systems from the level of the common femoral vein and including the common femoral, femoral, profunda femoral, popliteal and calf veins including the posterior tibial, peroneal and gastrocnemius veins when visible. The superficial great saphenous vein was also interrogated. Spectral Doppler was utilized to evaluate flow at rest and with distal augmentation maneuvers in the common femoral, femoral and popliteal veins. COMPARISON:  None. FINDINGS: Contralateral Common Femoral Vein: Respiratory phasicity is normal and symmetric with the symptomatic side. No evidence of thrombus. Normal compressibility. Common Femoral Vein: No evidence of thrombus.  Normal compressibility, respiratory phasicity and response to augmentation. Saphenofemoral Junction: No evidence of thrombus. Normal compressibility and flow on color Doppler imaging. Profunda Femoral Vein: No evidence of thrombus. Normal compressibility and flow on color Doppler imaging. Femoral Vein: No evidence of thrombus. Normal compressibility, respiratory phasicity and response to augmentation. Popliteal Vein: No evidence of thrombus. Normal compressibility, respiratory phasicity and response to augmentation. Calf Veins: No evidence of thrombus. Normal  compressibility and flow on color Doppler imaging. Superficial Great Saphenous Vein: No evidence of thrombus. Normal compressibility. Venous Reflux:  None. Other Findings: Complex fluid collection in the medial upper thigh in the region of the inguinal crease measures approximately 11.5 x 3.0 by 7.8 cm. No evidence of internal vascularity on color Doppler imaging to suggest a solid mass. IMPRESSION: No evidence of deep venous thrombosis. Complex fluid collection in the medial upper thigh measures approximately 11.5 x 3.0 x 7.8 cm. Differential considerations include subcutaneous abscess versus hematoma. Electronically Signed   By: Jacqulynn Cadet M.D.   On: 07/06/2018 16:58   [place] Ekg Site Rite  Result Date: 07/12/2018 If Site Rite image not attached, placement could not be confirmed due to current cardiac rhythm.    CBC Recent Labs  Lab 07/10/18 0350 07/11/18 0445 07/12/18 0406 07/13/18 0255 07/14/18 0546  WBC 11.5* 12.7* 14.5* 9.3 9.8  HGB 13.6 13.3 13.2 12.2* 12.2*  HCT 41.8 41.1 41.1 38.0* 38.0*  PLT 235 256 295 289 292  MCV 96.8 96.3 96.9 96.9 97.7  MCH 31.5 31.1 31.1 31.1 31.4  MCHC 32.5 32.4 32.1 32.1 32.1  RDW 12.3 12.5 12.4 12.7 12.8    Chemistries  Recent Labs  Lab 07/11/18 0445 07/13/18 0255  NA  --  136  K  --  4.4  CL  --  100  CO2  --  30  GLUCOSE  --  139*  BUN  --  20  CREATININE 0.92 0.98  CALCIUM  --  8.1*   ------------------------------------------------------------------------------------------------------------------ estimated creatinine clearance is 108.4 mL/min (by C-G formula based on SCr of 0.98 mg/dL). ------------------------------------------------------------------------------------------------------------------ Recent Labs    07/13/18 0255  HGBA1C 6.0*   ------------------------------------------------------------------------------------------------------------------ No results for input(s): CHOL, HDL, LDLCALC, TRIG, CHOLHDL, LDLDIRECT in  the last 72 hours. ------------------------------------------------------------------------------------------------------------------ No results for input(s): TSH, T4TOTAL, T3FREE, THYROIDAB in the last 72 hours.  Invalid input(s): FREET3 ------------------------------------------------------------------------------------------------------------------ No results for input(s): VITAMINB12, FOLATE, FERRITIN, TIBC, IRON, RETICCTPCT in the last 72 hours.  Coagulation profile No results for input(s): INR, PROTIME in the last 168 hours.  No results for input(s): DDIMER in the last 72 hours.  Cardiac Enzymes No results for input(s): CKMB, TROPONINI, MYOGLOBIN in the last 168 hours.  Invalid input(s): CK ------------------------------------------------------------------------------------------------------------------ Invalid input(s): POCBNP    Assessment & Plan   Left thigh abscess/necrotizing soft tissue infection- leukocytosis improved. -s/p I&D of complex left thigh abscess, excisional debridement of subcutaneous tissue and muscle, and wound VAC placement 07/11/18 -Surgery following- plan for wound VAC change in the OR today -Follow-up intraoperative wound cultures -ID consult- recommended omnicef 300mg  bid and flagyl 500mg  tid x 14 days -Continue vancomycin, ceftriaxone, and clindamycin -Pain control -Will need PICC removed prior to discharge  Chronic hepatitis C infection- patient states he has been diagnosed with hepatitis C in the past.  Has never been treated.   -Hep C antibody positive -Hep C RNA quant is high -Needs to follow-up with GI as an outpatient  Tobacco abuse -Tobacco cessation counseling performed this admission -Nicotine patch  Patient can likely  be discharged tomorrow.  Will need home health for wound VAC changes.     Code Status Orders  (From admission, onward)         Start     Ordered   07/06/18 2130  Full code  Continuous     07/06/18 2132          Code Status History    This patient has a current code status but no historical code status.      Consults surgery, ID  DVT Prophylaxis  Lovenox  Lab Results  Component Value Date   PLT 292 07/14/2018   Time Spent in minutes 35 min Greater than 50% of time spent in care coordination and counseling patient regarding the condition and plan of care.   Berna Spare Lanyia Jewel M.D on 07/14/2018 at 12:04 PM  Between 7am to 6pm - Pager - [phone]  After 6pm go to [email or web address] - Proofreader  [employer]   Office  [phone]

## 2018-07-14 NOTE — Consult Note (Signed)
Pharmacy Antibiotic Note  Corey James is a 63 y.o. male admitted on 07/06/2018 with cellulitis/Left thigh necrotizing? soft tissue infection.   Pharmacy has been consulted for Vancomycin dosing. ID following Patient also on Clindamycin and Ceftriaxone  Plan: Loading dose: Vancomycin 2500 mg once (given as two doses 1000mg  plus 1500 mg) Vancomycin 1250 mg IV Q 12 hrs. Goal AUC 400-550. Expected AUC: 509.4 SCr used: 0.95  2/20:  S/p I&D - Wound CX= rare GPC, NGTD.  ID MD note- unsure if pt will need IV abx at discharge at this time. Will order Vancomycin levels to evaluate dose. Peak 2 hours after end of 11:00 infusion at 1430 today and Trough prior to 2300 dose at 2230.  2/21: Of note vancomycin now d/c'd by ID Levels 2/20: 1432 = peak of 25 2216 = trough of 12 Results in AUC 427.2    Height: 5\' 9"  (175.3 cm) Weight: 299 lb 13.2 oz (136 kg) IBW/kg (Calculated) : 70.7  Temp (24hrs), Avg:97.6 F (36.4 C), Min:97 F (36.1 C), Max:98.5 F (36.9 C)  Recent Labs  Lab 07/10/18 0350 07/11/18 0445 07/12/18 0406 07/13/18 0255 07/13/18 1432 07/13/18 2216 07/14/18 0546  WBC 11.5* 12.7* 14.5* 9.3  --   --  9.8  CREATININE  --  0.92  --  0.98  --   --   --   VANCOTROUGH  --   --   --   --   --  12*  --   VANCOPEAK  --   --   --   --  25*  --   --     Estimated Creatinine Clearance: 108.4 mL/min (by C-G formula based on SCr of 0.98 mg/dL).    Allergies  Allergen Reactions  . Penicillins Itching and Rash    Antimicrobials this admission: Metronidazole ordered 2/13 but not given per [facility] Imipenem/Cilastin 2/13 >> 2/17 Vancomycin  2/13 >> Clindamycin 2/14 >> Ceftriaxone 2/17 >>  Dose adjustments this admission:   Microbiology results: 2/13 BCx: NG MRSA PCR neg. 2/18 Wound cx: rare GPC, pending  Thank you for allowing pharmacy to be a part of this patient's care.  Rocky Morel, PharmD Clinical Pharmacist 07/14/2018 11:54 AM

## 2018-07-14 NOTE — Op Note (Addendum)
  Procedure Date:  07/14/2018  Pre-operative Diagnosis:  Left thigh necrotizing soft tissue infection  Post-operative Diagnosis:  Left thigh necrotizing soft tissue infection  Procedure:  Left thigh wound exploration, minimal debridement, and wound vac placement  Surgeon:  Melvyn Neth, MD  Anesthesia:  General endotracheal  Estimated Blood Loss:  5 ml  Specimens:  None  Complications:  None  Indications for Procedure:  This is a 63 y.o. male with diagnosis of left thigh necrotizing soft tissue infection, s/p I&D on 2/18 with Dr. Dahlia Byes.  He is now due to the OR for a wound exploration and wound vac replacement.  The risks of bleeding, abscess or infection, injury to surrounding structures, and need for further procedures were all discussed with the patient and was willing to proceed.  Description of Procedure: The patient was correctly identified in the preoperative area and brought into the operating room.  The patient was placed supine with VTE prophylaxis in place.  Appropriate time-outs were performed.  Anesthesia was induced and the patient was intubated.  Appropriate antibiotics were infused.  The patient's dressing was removed and a new picture of the wound was taken through [facility].  Please see patient's chart for picture.  The patient's left thigh was prepped and draped in usual sterile fashion.  Very minimal debridement was performed using cautery.  The wound bed looked healthy with appropriate granulation tissue.  There was no purulent fluid.  The wound measured 16 cm length, 3 cm width, and 10 cm depth.  It involved only subcutaneous tissue and no muscle or fascia.  No blood vessels were visible.  The wound was irrigated thoroughly and dried.  A new black wound vac sponge was placed into the wound, assuring appropriate coverage of the entire depth of the wound.  Appropriate tape dressing was placed and suction disc placed and connected, without any leaks showing on screen.    The patient was then emerged from anesthesia, extubated, and brought to the recovery room for further management.  The patient tolerated the procedure well and all counts were correct at the end of the case.   Melvyn Neth, MD

## 2018-07-14 NOTE — Progress Notes (Signed)
During rounds, RN noted that dressing was out of the left thigh wound and on the floor. Pt. states that he does not know how this happened. Wet to dry dressing applied to the wound.  RN educated pt. about the importance of keeping the wound and dressing clean as possible.  Pt. voices understanding.  Will continue to monitor and assess as needed.

## 2018-07-14 NOTE — Care Management Note (Signed)
Case Management Note  Patient Details  Name: Corey James MRN: [id number] Date of Birth: Mar 30, 1956  Subjective/Objective:                    Action/Plan:   Expected Discharge Date:  07/10/18               Expected Discharge Plan:     In-House Referral:     Discharge planning Services  CM Consult  Post Acute Care Choice:  Home Health Choice offered to:  Patient  DME Arranged:   None needed per the patient, has a cane at home DME Agency:   N/A  HH Arranged:  RN [place] Agency:  [place]  Status of Service:  In process, will continue to follow ,    If discussed at Long Length of Stay Meetings, dates discussed:  07/11/18 and 2/20 20  Additional Comments: Patient has no insurance, set up with [facility] for [facility] for Wound care dressing change Mon Wed Fri, sent Secure inbox referal to Lurena Nida with [facility], provided the patient with the [facility] application and the application for [facility] to fill out to get at no cost or low cost  Su Hilt, RN 07/14/2018, 3:20 PM
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
## 2018-07-14 NOTE — Anesthesia Post-op Follow-up Note (Signed)
Anesthesia QCDR form completed.        

## 2018-07-14 NOTE — Progress Notes (Signed)
Dr. on the unit to remove wound vac and replace dressing.

## 2018-07-14 NOTE — Progress Notes (Signed)
Patient called out pain medication. When RN arrived to room patient was sound asleep snoring. Medication returned to pyxis

## 2018-07-14 NOTE — Care Management (Signed)
[facility] on board for wound VAC, May go home tomorrow. PO ABX will be TX at home, PICC to be removed prior to DC

## 2018-07-14 NOTE — Care Management (Signed)
Provided Corey James with [facility] sheet, H&P note, Surgical note including measurements and picture of the wound

## 2018-07-14 NOTE — Anesthesia Postprocedure Evaluation (Signed)
Anesthesia Post Note  Patient: Corey James  Procedure(s) Performed: WOUND VAC CHANGE (Left ) POSSIBLE DEBRIDEMENT WOUND-LEFT THIGH (Left )  Patient location during evaluation: PACU Anesthesia Type: General Level of consciousness: awake and alert and oriented Pain management: pain level controlled Vital Signs Assessment: post-procedure vital signs reviewed and stable Respiratory status: spontaneous breathing, nonlabored ventilation and respiratory function stable Cardiovascular status: blood pressure returned to baseline and stable Postop Assessment: no signs of nausea or vomiting Anesthetic complications: no     Last Vitals:  Vitals:   07/14/18 1125 07/14/18 1150  BP:  (!) 156/92  Pulse: 83 92  Resp: 14 20  Temp: (!) 36.2 C 36.4 C  SpO2: 99% 92%    Last Pain:  Vitals:   07/14/18 1150  TempSrc: Oral  PainSc:                  Sebron Mcmahill

## 2018-07-14 NOTE — Care Management Note (Signed)
Case Management Note  Patient Details  Name: Corey James MRN: [id number] Date of Birth: Sep 27, 1955  Subjective/Objective:                   Met with the patient and his spouse to discuss DC plan and Needs Patient states that he has no DME needs at home [facility] is set up to do the Wound Vac, patient will go home on PO AB and pain meds The patient has no insurance and he states that he can afford his medications [facility] is taking charity patients this week Notified Corey James of the [facility] need for Wound Care [facility] thru Corey James via Inbox Action/Plan: [facility] list provided, patient does not have insurance and will be set up with [facility], [facility]   Expected Discharge Date:  07/10/18               Expected Discharge Plan:     In-House Referral:     Discharge planning Services  CM Consult  Post Acute Care Choice:  Home Health Choice offered to:  Patient  DME Arranged:    DME Agency:     HH Arranged:  RN Corey James Agency:  Corey James  Status of Service:  In process, will continue to follow  If discussed at Long Length of Stay Meetings, dates discussed:    Additional Comments:  Corey Hilt, RN 07/14/2018, 2:58 PM

## 2018-07-15 LAB — CBC
HCT: 35.1 % — ABNORMAL LOW (ref 39.0–52.0)
Hemoglobin: 11.2 g/dL — ABNORMAL LOW (ref 13.0–17.0)
MCH: 30.9 pg (ref 26.0–34.0)
MCHC: 31.9 g/dL (ref 30.0–36.0)
MCV: 97 fL (ref 80.0–100.0)
PLATELETS: 269 10*3/uL (ref 150–400)
RBC: 3.62 MIL/uL — AB (ref 4.22–5.81)
RDW: 12.6 % (ref 11.5–15.5)
WBC: 6.5 10*3/uL (ref 4.0–10.5)
nRBC: 0 % (ref 0.0–0.2)

## 2018-07-15 MED ORDER — HYDROCODONE-ACETAMINOPHEN 7.5-325 MG PO TABS: 1.0000 | ORAL_TABLET | Freq: Four times a day (QID) | ORAL | 0 refills | Status: DC | PRN

## 2018-07-15 MED ORDER — ACETAMINOPHEN 500 MG PO TABS: 500.0000 mg | ORAL_TABLET | Freq: Four times a day (QID) | ORAL | 0 refills | Status: DC | PRN

## 2018-07-15 MED ORDER — NICOTINE 21 MG/24HR TD PT24: 21.0000 mg | MEDICATED_PATCH | Freq: Every day | TRANSDERMAL | 0 refills | Status: DC

## 2018-07-15 MED ORDER — METRONIDAZOLE 500 MG PO TABS: 500.0000 mg | ORAL_TABLET | Freq: Three times a day (TID) | ORAL | 0 refills | Status: DC

## 2018-07-15 MED ORDER — CEFDINIR 300 MG PO CAPS
300.0000 mg | ORAL_CAPSULE | Freq: Two times a day (BID) | ORAL | Status: DC
  Administered 2018-07-15: 300 mg via ORAL
  Filled 2018-07-15 (×3): qty 1

## 2018-07-15 MED ORDER — METRONIDAZOLE 500 MG PO TABS: 500.0000 mg | ORAL_TABLET | Freq: Three times a day (TID) | ORAL | 0 refills | Status: AC

## 2018-07-15 MED ORDER — CEFDINIR 300 MG PO CAPS: 300.0000 mg | ORAL_CAPSULE | Freq: Two times a day (BID) | ORAL | 0 refills | Status: AC

## 2018-07-15 NOTE — Discharge Instructions (Signed)
Follow-up with primary care physician in 3 days Follow-up with surgery Dr. Dahlia Byes call as soon as possible and make an appointment \\Follow -up with gastroenterology in 2 weeks Continue wound care, complete antibiotics as prescribed

## 2018-07-15 NOTE — Progress Notes (Signed)
[facility] Day(s): 9.   Post op day(s): 1 Day Post-Op.   Interval History: Patient seen and examined, no acute events or new complaints overnight. Patient reports that the [facility] system has not been working properly.  Every time he moves the machine starts to be up.  There is also malodorous drainage., denies fever chills.  Vital signs in last 24 hours: [min-max] current  Temp:  [97.2 F (36.2 C)-98.6 F (37 C)] 98.1 F (36.7 C) (02/22 0824) Pulse Rate:  [73-92] 73 (02/22 0824) Resp:  [14-20] 16 (02/21 2311) BP: (110-156)/(76-96) 146/87 (02/22 0824) SpO2:  [92 %-99 %] 97 % (02/22 0824)     Height: 5\' 9"  (175.3 cm) Weight: 136 kg BMI (Calculated): 44.26    Physical Exam:  Constitutional: alert, cooperative and no distress  Extremity: Left leg open wound with VAC system.  VAC system was changed.  There is adequate granulation tissue.  There is no necrotic tissue.  Labs:  CBC Latest Ref Rng & Units 07/15/2018 07/14/2018 07/13/2018  WBC 4.0 - 10.5 K/uL 6.5 9.8 9.3  Hemoglobin 13.0 - 17.0 g/dL 11.2(L) 12.2(L) 12.2(L)  Hematocrit 39.0 - 52.0 % 35.1(L) 38.0(L) 38.0(L)  Platelets 150 - 400 K/uL 269 292 289   CMP Latest Ref Rng & Units 07/13/2018 07/11/2018 07/07/2018  Glucose 70 - 99 mg/dL 139(H) - 125(H)  BUN 8 - 23 mg/dL 20 - 16  Creatinine 0.61 - 1.24 mg/dL 0.98 0.92 1.04  Sodium 135 - 145 mmol/L 136 - 138  Potassium 3.5 - 5.1 mmol/L 4.4 - 4.0  Chloride 98 - 111 mmol/L 100 - 103  CO2 22 - 32 mmol/L 30 - 30  Calcium 8.9 - 10.3 mg/dL 8.1(L) - 8.5(L)  Total Protein 6.5 - 8.1 g/dL - - -  Total Bilirubin 0.3 - 1.2 mg/dL - - -  Alkaline Phos 38 - 126 U/L - - -  AST 15 - 41 U/L - - -  ALT 0 - 44 U/L - - -    The VAC system was removed completely from the wound.  The wound was addressed with adequate granulation tissue and no necrotic tissue.  The measurement of the wound remains the same with 16 cm long, 3 cm width and 10 cm of the left.  And new sponge was cut in  the shape of the wound and placed inside the wound that was bigger than the previous 1.  And the wound was covered with layers.  The wound VAC system was tested and it was working properly.   Assessment/Plan:  63 y.o. male with necrotizing fasciitis of the left thigh s/p debridement and negative pressure dressing placement. The negative pressure dressing was not working properly and I needed to be changed before he goes home today.  After placement the [facility] system was working properly.  There was no sign of necrotic tissue and no indication for further debridement.  Agree with discharge and home care.  Arnold Long, MD

## 2018-07-15 NOTE — Discharge Summary (Signed)
[facility] [place] NAME: Corey James    MR#:  [id number]  DATE OF BIRTH:  [birth year]  DATE OF ADMISSION:  07/06/2018 ADMITTING PHYSICIAN: Saundra Shelling, MD  DATE OF DISCHARGE:  07/15/18  PRIMARY CARE PHYSICIAN: System, Provider Not In    ADMISSION DIAGNOSIS:  Abscess of left lower extremity [L02.416]  DISCHARGE DIAGNOSIS:  Active Problems:   Thigh abscess   Abscess of left lower extremity   SECONDARY DIAGNOSIS:   Past Medical History:  Diagnosis Date  . Disc degeneration, lumbosacral     HOSPITAL COURSE:   HISTORY OF PRESENT ILLNESS: Corey James  is a 63 y.o. male with a known history of lumbosacral disc degeneration presented to the emergency room for redness and swelling in the left thigh.  Started off as a small blister which increased in size later on became indurated.  There is swelling and redness in the left thigh along with tenderness.  Pain is aching in nature 8 out of 10 on a scale of 1-10.  Was evaluated in the emergency room with ultrasound of the left thigh which showed complex fluid collection suggesting abscess.  No evidence of DVT  Left thigh abscess/necrotizing soft tissue infection- leukocytosis improved. -s/p I&D of complex left thigh abscess, excisional debridement of subcutaneous tissue and muscle, and wound VAC placement 07/11/18 -Patient had wound VAC change in the OR on 07/14/2018 -Okay to discharge patient with wound VAC and patient is to call Dr. Dahlia Byes on Monday to make earliest possible appointment -Continue wound VAC and wound care with dressing changes on Monday Wednesday and Friday home health arranged discussed with social worker -Follow-up intraoperative wound cultures-from 2/18 with abundant WBCs rare gram-positive cocci ,rare Streptococcus group C -ID consult- recommended omnicef 300mg  bid and flagyl 500mg  tid x total 14 days until March 1 -Pain control as needed -PICC will be removed removed  prior to discharge  Chronic hepatitis C infection- patient states he has been diagnosed with hepatitis C in the past.  Has never been treated.   -Hep C antibody positive -Hep C RNA quant is high -Needs to follow-up with GI as an outpatient. primary care physician to refer the patient to GI  Obesity lifestyle changes advised  Tobacco abuse -Tobacco cessation counseling performed this admission -Nicotine patch   Discharge patient with wound VAC patient is agreeable already dressed to go home DISCHARGE CONDITIONS:   fair  CONSULTS OBTAINED:  Treatment Team:  [name], MD   PROCEDURES wound debridement on 218 by Dr. Perrin Maltese and on 2/21  DRUG ALLERGIES:   Allergies  Allergen Reactions  . Penicillins Itching and Rash    DISCHARGE MEDICATIONS:   Allergies as of 07/15/2018      Reactions   Penicillins Itching, Rash      Medication List    STOP taking these medications   oxyCODONE-acetaminophen 5-325 MG tablet Commonly known as:  PERCOCET   sulfamethoxazole-trimethoprim 800-160 MG tablet Commonly known as:  BACTRIM DS,SEPTRA DS     TAKE these medications   acetaminophen 500 MG tablet Commonly known as:  TYLENOL Take 1 tablet (500 mg total) by mouth every 6 (six) hours as needed for mild pain or fever.   cefdinir 300 MG capsule Commonly known as:  OMNICEF Take 1 capsule (300 mg total) by mouth every 12 (twelve) hours for 8 days.   docusate sodium 100 MG capsule Commonly known as:  COLACE Take 100 mg by mouth 2 (two)  times daily.   gabapentin 300 MG capsule Commonly known as:  NEURONTIN Take 300 mg by mouth daily.   HYDROcodone-acetaminophen 7.5-325 MG tablet Commonly known as:  NORCO Take 1 tablet by mouth every 6 (six) hours as needed for moderate pain.   metroNIDAZOLE 500 MG tablet Commonly known as:  FLAGYL Take 1 tablet (500 mg total) by mouth 3 (three) times daily for 8 days.   nicotine 21 mg/24hr patch Commonly known as:  NICODERM CQ -  dosed in mg/24 hours Place 1 patch (21 mg total) onto the skin daily. Start taking on:  July 16, 2018        DISCHARGE INSTRUCTIONS:  Follow-up with primary care physician in 3 days Follow-up with surgery Dr. Dahlia Byes call as soon as possible and make an appointment Continue wound care, complete antibiotics as prescribed Follow-up with gastroenterology in 2 weeks  DIET:  Cardiac diet  DISCHARGE CONDITION:  Fair  ACTIVITY:  Activity as tolerated  OXYGEN:  Home Oxygen: No.   Oxygen Delivery: room air  DISCHARGE LOCATION:  home   If you experience worsening of your admission symptoms, develop shortness of breath, life threatening emergency, suicidal or homicidal thoughts you must seek medical attention immediately by calling 911 or calling your MD immediately  if symptoms less severe.  You Must read complete instructions/literature along with all the possible adverse reactions/side effects for all the Medicines you take and that have been prescribed to you. Take any new Medicines after you have completely understood and accpet all the possible adverse reactions/side effects.   Please note  You were cared for by a hospitalist during your hospital stay. If you have any questions about your discharge medications or the care you received while you were in the hospital after you are discharged, you can call the unit and asked to speak with the hospitalist on call if the hospitalist that took care of you is not available. Once you are discharged, your primary care physician will handle any further medical issues. Please note that NO REFILLS for any discharge medications will be authorized once you are discharged, as it is imperative that you return to your primary care physician (or establish a relationship with a primary care physician if you do not have one) for your aftercare needs so that they can reassess your need for medications and monitor your lab values.     Today  Chief  Complaint  Patient presents with  . Wound Check     ROS:  CONSTITUTIONAL: Denies fevers, chills. Denies any fatigue, weakness.  EYES: Denies blurry vision, double vision, eye pain. EARS, NOSE, THROAT: Denies tinnitus, ear pain, hearing loss. RESPIRATORY: Denies cough, wheeze, shortness of breath.  CARDIOVASCULAR: Denies chest pain, palpitations, edema.  GASTROINTESTINAL: Denies nausea, vomiting, diarrhea, abdominal pain. Denies bright red blood per rectum. GENITOURINARY: Denies dysuria, hematuria. ENDOCRINE: Denies nocturia or thyroid problems. HEMATOLOGIC AND LYMPHATIC: Denies easy bruising or bleeding. SKIN: Left thigh with wound VAC, minimal pain and redness MUSCULOSKELETAL: Denies pain in neck, back, shoulder, knees, hips or arthritic symptoms.  NEUROLOGIC: Denies paralysis, paresthesias.  PSYCHIATRIC: Denies anxiety or depressive symptoms.   VITAL SIGNS:  Blood pressure (!) 146/87, pulse 73, temperature 98.1 F (36.7 C), temperature source Oral, resp. rate 16, height 5\' 9"  (1.753 m), weight 136 kg, SpO2 97 %.  I/O:    Intake/Output Summary (Last 24 hours) at 07/15/2018 0938 Last data filed at 07/15/2018 0737 Gross per 24 hour  Intake 1577.74 ml  Output 300 ml  Net 1277.74 ml    PHYSICAL EXAMINATION:  GENERAL:  63 y.o.-year-old patient lying in the bed with no acute distress. Morbidly obese EYES: Pupils equal, round, reactive to light and accommodation. No scleral icterus. Extraocular muscles intact.  HEENT: Head atraumatic, normocephalic. Oropharynx and nasopharynx clear.  NECK:  Supple, no jugular venous distention. No thyroid enlargement, no tenderness.  LUNGS: Normal breath sounds bilaterally, no wheezing, rales,rhonchi or crepitation. No use of accessory muscles of respiration.  CARDIOVASCULAR: S1, S2 normal. No murmurs, rubs, or gallops.  ABDOMEN: Soft, non-tender, non-distended. Bowel sounds present.  EXTREMITIES: Left posterior medial aspect of the thigh with  wound covered with wound VAC ,no pedal edema, cyanosis, or clubbing.  NEUROLOGIC: Awake, alert and oriented x3. Sensation intact. Gait not checked.  PSYCHIATRIC: The patient is alert and oriented x 3.  SKIN: No obvious rash, lesion, or ulcer.   DATA REVIEW:   CBC Recent Labs  Lab 07/15/18 0500  WBC 6.5  HGB 11.2*  HCT 35.1*  PLT 269    Chemistries  Recent Labs  Lab 07/13/18 0255  NA 136  K 4.4  CL 100  CO2 30  GLUCOSE 139*  BUN 20  CREATININE 0.98  CALCIUM 8.1*    Cardiac Enzymes No results for input(s): TROPONINI in the last 168 hours.  Microbiology Results  Results for orders placed or performed during the hospital encounter of 07/06/18  Blood culture (routine x 2)     Status: None   Collection Time: 07/06/18  4:45 PM  Result Value Ref Range Status   Specimen Description BLOOD LEFT HAND  Final   Special Requests   Final    BOTTLES DRAWN AEROBIC AND ANAEROBIC Blood Culture adequate volume   Culture   Final    NO GROWTH 5 DAYS Performed at [facility], [address] [place], [place] [address]    Report Status 07/11/2018 FINAL  Final  Blood culture (routine x 2)     Status: None   Collection Time: 07/06/18  4:46 PM  Result Value Ref Range Status   Specimen Description BLOOD LEFT ARM  Final   Special Requests   Final    BOTTLES DRAWN AEROBIC AND ANAEROBIC Blood Culture results may not be optimal due to an excessive volume of blood received in culture bottles   Culture   Final    NO GROWTH 5 DAYS Performed at [facility], [address] [place], [place] [address]    Report Status 07/11/2018 FINAL  Final  MRSA PCR Screening     Status: None   Collection Time: 07/07/18  6:35 AM  Result Value Ref Range Status   MRSA by PCR NEGATIVE NEGATIVE Final    Comment:        The GeneXpert MRSA Assay (FDA approved for NASAL specimens only), is one component of a comprehensive MRSA colonization surveillance program. It is not intended to  diagnose MRSA infection nor to guide or monitor treatment for MRSA infections. Performed at [facility], [address] [place], [place] [address]   Aerobic/Anaerobic Culture (surgical/deep wound)     Status: None (Preliminary result)   Collection Time: 07/11/18  2:20 PM  Result Value Ref Range Status   Specimen Description   Final    WOUND LEFT THIGH Performed at [facility], [address], [place], [place] [address]    Special Requests   Final    NONE Performed at [facility], [address], [place], [place] [address]  Gram Stain   Final    ABUNDANT WBC PRESENT, PREDOMINANTLY PMN RARE GRAM POSITIVE COCCI Performed at [facility], [address] [place], [place] [address]    Culture   Final    RARE STREPTOCOCCUS GROUP C CULTURE REINCUBATED FOR BETTER GROWTH NO ANAEROBES ISOLATED; CULTURE IN PROGRESS FOR 5 DAYS    Report Status PENDING  Incomplete  Aerobic/Anaerobic Culture (surgical/deep wound)     Status: None (Preliminary result)   Collection Time: 07/11/18  2:28 PM  Result Value Ref Range Status   Specimen Description   Final    WOUND LEFT THIGH Performed at [facility], [address], [place], [place] [address]    Special Requests   Final    NONE Performed at [facility], [address] [place], [place] [address]    Gram Stain   Final    MODERATE WBC PRESENT, PREDOMINANTLY PMN RARE GRAM POSITIVE COCCI Performed at [facility], [place] [address], [place], [place] [address]    Culture   Final    RARE STREPTOCOCCUS GROUP C NO ANAEROBES ISOLATED; CULTURE IN PROGRESS FOR 5 DAYS    Report Status PENDING  Incomplete    RADIOLOGY:  [place] Ekg Site Rite  Result Date: 07/12/2018 If Site Rite image not attached, placement could not be confirmed due to current cardiac rhythm.   EKG:  No orders found for this or any previous visit.    Management plans discussed with the patient, family and they  are in agreement.  CODE STATUS:     Code Status Orders  (From admission, onward)         Start     Ordered   07/06/18 2130  Full code  Continuous     07/06/18 2132        Code Status History    This patient has a current code status but no historical code status.      TOTAL TIME TAKING CARE OF THIS PATIENT: 43  minutes.   Note: This dictation was prepared with Dragon dictation along with smaller phrase technology. Any transcriptional errors that result from this process are unintentional.   @[facility] @  on 07/15/2018 at 9:38 AM  Between 7am to 6pm - Pager - [phone]  After 6pm go to [email or web address] - password [email or web address]  [facility]  Office  [phone]  CC: Primary care physician; System, Provider Not In

## 2018-07-15 NOTE — Progress Notes (Signed)
Patient d/c home. Wife is coming to pick up patient, [facility] was delivered and placed on patient. PICC line removed earlier this morning with no complications. Instructions and prescriptions given to patient, verbalized understanding.

## 2018-07-15 NOTE — Care Management Note (Signed)
Case Management Note  Patient Details  Name: Corey James MRN: [id number] Date of Birth: August 10, 1955  Subjective/Objective:  Patient to be discharged per MD order. Orders in place for home health services. Previous RNCM had began workup for charitable services through [facility] care for an RN. Confirmed referral with J[facility] at [facility]. Patient approved for [facility] vac. All forms were already sent and approved by [facility]. Per Olivia Mackie delivery is set for today. Spouse to transport.                   Action/Plan:   Expected Discharge Date:  07/15/18               Expected Discharge Plan:     In-House Referral:     Discharge planning Services  CM Consult  Post Acute Care Choice:  Home Health Choice offered to:  Patient  DME Arranged:  Vac DME Agency:  [facility]  HH Arranged:  RN [place] Agency:  [place]  Status of Service:  Completed, signed off  If discussed at [place] Level of Stay Meetings, dates discussed:    Additional Comments:  Latanya Maudlin, RN 07/15/2018, 10:21 AM

## 2018-07-17 LAB — AEROBIC/ANAEROBIC CULTURE W GRAM STAIN (SURGICAL/DEEP WOUND)

## 2018-07-17 LAB — AEROBIC/ANAEROBIC CULTURE (SURGICAL/DEEP WOUND)

## 2018-07-18 ENCOUNTER — Telehealth: Payer: Self-pay | Admitting: *Deleted

## 2018-07-18 ENCOUNTER — Telehealth: Payer: Self-pay | Admitting: Surgery

## 2018-07-18 MED ORDER — CLINDAMYCIN HCL 300 MG PO CAPS: 300.0000 mg | ORAL_CAPSULE | Freq: Three times a day (TID) | ORAL | 0 refills | Status: AC

## 2018-07-18 NOTE — Telephone Encounter (Signed)
Dr Delaine Lame recommends pt be seen sooner for his appointment with Dr Dahlia Byes and Cleocin 300 mg TID, sent to pharmacy. I talked with the patient and he states he will call back in the morning if he can get here for the 10:00 appointment (aware to bring wound vac supplies) and he was also concerned about how much the new RX was going to cost, Dr Hampton Abbot and Dr Delaine Lame aware.

## 2018-07-18 NOTE — Anesthesia Postprocedure Evaluation (Signed)
Anesthesia Post Note  Patient: Corey James  Procedure(s) Performed: INCISION AND DRAINAGE ABSCESS-LEFT THIGH (Left Thigh)  Patient location during evaluation: PACU Anesthesia Type: General Level of consciousness: awake and alert and lethargic Pain management: pain level controlled Vital Signs Assessment: post-procedure vital signs reviewed and stable Respiratory status: spontaneous breathing Cardiovascular status: blood pressure returned to baseline Anesthetic complications: no     Last Vitals:  Vitals:   07/14/18 2311 07/15/18 0824  BP: (!) 110/91 (!) 146/87  Pulse: 73 73  Resp: 16   Temp: 36.6 C 36.7 C  SpO2: 97% 97%    Last Pain:  Vitals:   07/15/18 0951  TempSrc:   PainSc: 4                  Kellis Topete

## 2018-07-18 NOTE — Telephone Encounter (Signed)
Patient called and stated that the medication he was prescribed flagyl is making him nauseous. No vomiting.  He had surgery on 07/14/18 by Dr.Piscoya  I talked with the patient and he states he is afebrile, but he feels like he "doesn't have any energy". He admits to itching all over. Advised to stop the Flagyl and Omnicef. Recommended taking benadryl for the itching and that we would call him back in the morning with next steps, he agrees.  Dr Hampton Abbot states that ID placed the patient on the antibiotics.

## 2018-07-18 NOTE — Telephone Encounter (Signed)
Patient has called and left a message with the answering service stating that his antibiotics are making him nauseous and sick feeling. Please call patient at [phone].  Patient had an incision and drainage of a complex left thigh abscess with debridement on 07/11/18 with Dr Dahlia Byes.

## 2018-07-18 NOTE — Telephone Encounter (Signed)
Patient called and stated that the medication he was prescribed flagyl is making him nauseous. He had surgery on 07/14/18 by Dr.Piscoya Please call and advise

## 2018-07-19 ENCOUNTER — Telehealth: Payer: Self-pay | Admitting: *Deleted

## 2018-07-19 ENCOUNTER — Ambulatory Visit: Payer: Self-pay | Admitting: Surgery

## 2018-07-19 NOTE — Telephone Encounter (Signed)
OK thx  

## 2018-07-19 NOTE — Telephone Encounter (Signed)
He called this morning and cancelled his appointment for today stating he felt much better. No further nausea or itching.  He did pick up the Cleocin and states the side effects of diarrhea has him troubled because he "doesn't want diarrhea". Advised to take with yogurt or probiotics to help and if necessary he needs to call Dr Delaine Lame office to let them know of his concerns. He states he will try it with yogurt and call if needed.

## 2018-07-20 ENCOUNTER — Telehealth: Payer: Self-pay

## 2018-07-20 NOTE — Telephone Encounter (Signed)
EMMI Follow-up: Noted on the report that the patient has some questions about his discharge paperwork, wasn't sure who to follow-up with if there was a change in his condition and responded no to follow-up appointments.  I talked with Corey James and he said [facility] would not see him.  I asked if he was aware other follow-up appointments and he said yes. He said he was trying to sleep and did not have any other questions. I thanked him for his time.

## 2018-07-21 DIAGNOSIS — M7989 Other specified soft tissue disorders: Secondary | ICD-10-CM

## 2018-07-26 ENCOUNTER — Encounter: Payer: Self-pay | Admitting: *Deleted

## 2018-07-26 ENCOUNTER — Encounter: Payer: Self-pay | Admitting: Surgery

## 2018-07-26 ENCOUNTER — Other Ambulatory Visit: Payer: Self-pay

## 2018-07-26 ENCOUNTER — Ambulatory Visit (INDEPENDENT_AMBULATORY_CARE_PROVIDER_SITE_OTHER): Payer: Self-pay | Admitting: Surgery

## 2018-07-26 VITALS — BP 157/97 | HR 102 | Temp 97.5°F | Resp 18 | Ht 69.0 in | Wt 333.8 lb

## 2018-07-26 DIAGNOSIS — Z09 Encounter for follow-up examination after completed treatment for conditions other than malignant neoplasm: Secondary | ICD-10-CM

## 2018-07-26 MED ORDER — GABAPENTIN 300 MG PO CAPS: 300.0000 mg | ORAL_CAPSULE | Freq: Every day | ORAL | 0 refills | Status: DC

## 2018-07-26 MED ORDER — GABAPENTIN 300 MG PO CAPS: 300.0000 mg | ORAL_CAPSULE | Freq: Three times a day (TID) | ORAL | 0 refills | Status: DC

## 2018-07-26 NOTE — Patient Instructions (Addendum)
We will see you back in 2 weeks. Please continue the wound vac changes.    A prescription for gabapentin 300 mg has been sent in to your pharmacy today. Please pick up from your pharmacy. You may take this in addition to Aleve and Tylenol.

## 2018-07-26 NOTE — Progress Notes (Signed)
A prescription for gabapentin 300 mg TID #30 no refills was sent in to patient's pharmacy today.

## 2018-07-28 ENCOUNTER — Encounter: Payer: Self-pay | Admitting: Surgery

## 2018-07-28 NOTE — Progress Notes (Signed)
S/p debridment of left thigh Doing well In need for wound vac change. HAd some leak from vac No fevers or chills  PE NAD Wound vac removed , good pink granulation tissue. [name] No infection some mild irriation on surrounding skin. A new sponge was placed and connected to a wound vac. Wound measures 16 x 3.5,( 56cm2) depth 5 cms   A/p Doing well Continue wound vac change Finish a/bs RTC 2 weeks Added gabapentin for adjuvant pain control Tylenol and nsaids OTC recommended as well

## 2018-08-01 ENCOUNTER — Telehealth: Payer: Self-pay | Admitting: *Deleted

## 2018-08-01 NOTE — Telephone Encounter (Signed)
I do not feel comfortable refilling his narcotics, I reviewed the notes from [facility] ortho and looks that he is been getting quite a bit of pain pills from them. He may try to talk to his PCP

## 2018-08-01 NOTE — Telephone Encounter (Signed)
Patient called and wants to know if he can get any pain medication. He had surgery on 07/14/18

## 2018-08-01 NOTE — Telephone Encounter (Signed)
I talked with the patient and he states he is only using the gabapentin once a day, RX says TID, reviewed prescription with him, he states he can't "stomach it" that much, recommended trying at least twice a day with food. He states it hurts worse when the nurse comes out, recommended him taking ibuprofen prior to the visit. Aware no additional pain medications at this time.

## 2018-08-02 ENCOUNTER — Ambulatory Visit (INDEPENDENT_AMBULATORY_CARE_PROVIDER_SITE_OTHER): Payer: Self-pay | Admitting: Gastroenterology

## 2018-08-02 ENCOUNTER — Other Ambulatory Visit: Payer: Self-pay

## 2018-08-02 ENCOUNTER — Encounter: Payer: Self-pay | Admitting: Gastroenterology

## 2018-08-02 VITALS — BP 124/73 | HR 99 | Ht 69.0 in | Wt 339.0 lb

## 2018-08-02 DIAGNOSIS — B182 Chronic viral hepatitis C: Secondary | ICD-10-CM

## 2018-08-02 NOTE — Patient Instructions (Signed)
You are scheduled for a RUQ abdominal US/elastography at [facility] on Monday, March 16th at 9:00am. Please arrive at the medical mall registration desk at 8:45am. You cannot have anything to eat or drink after midnight on Sunday night.    If you need to reschedule this appointment for any reason, please contact central scheduling at [phone].

## 2018-08-02 NOTE — Progress Notes (Signed)
Gastroenterology Consultation  Referring Provider:     Dr. Brett Albino Primary Care Physician:  System, Provider Not In Primary Gastroenterologist:  Dr. Allen Norris     Reason for Consultation:     Hepatitis C        HPI:   Corey James is a 63 y.o. y/o male referred for consultation & management of hepatitis C by Dr. Brett Albino.  This patient comes in for evaluation after being in the hospital for a wound infection and had surgery.  The patient was reported to have chronic hepatitis C but had reported to the hospitalist that he had never been treated for it.  The patient was found to have a positive viral load at 2.7 million international unit/ml.  On February 8 of this year the patient had liver enzymes checked that were normal.  The patient reports that he did not know that he had hepatitis C but he last used drugs through intravenous route back in 1988.  The patient also denies any alcohol abuse.  The patient reports that he is no longer on any antibiotics for his necrotizing wound.  There is no report of any jaundice.  The patient also reports that he has never had a colonoscopy.  He is not having any change in bowel habits black stools bloody stools or unexplained weight loss.  Past Medical History:  Diagnosis Date  . Disc degeneration, lumbosacral     Past Surgical History:  Procedure Laterality Date  . APPLICATION OF WOUND VAC Left 07/14/2018   Procedure: WOUND VAC CHANGE;  Surgeon: Olean Ree, MD;  Location: [facility] ORS;  Service: General;  Laterality: Left;  . INCISION AND DRAINAGE ABSCESS Left 07/11/2018   Procedure: INCISION AND DRAINAGE ABSCESS-LEFT THIGH;  Surgeon: [name], MD;  Location: [facility] ORS;  Service: General;  Laterality: Left;  . WOUND DEBRIDEMENT Left 07/14/2018   Procedure: POSSIBLE DEBRIDEMENT WOUND-LEFT THIGH;  Surgeon: Olean Ree, MD;  Location: [facility] ORS;  Service: General;  Laterality: Left;    Prior to Admission medications   Medication Sig Start Date End Date  Taking? Authorizing Provider  acetaminophen (TYLENOL) 500 MG tablet Take 1 tablet (500 mg total) by mouth every 6 (six) hours as needed for mild pain or fever. 07/15/18   Nicholes Mango, MD  docusate sodium (COLACE) 100 MG capsule Take 100 mg by mouth 2 (two) times daily.    [provider]  gabapentin (NEURONTIN) 300 MG capsule Take 1 capsule (300 mg total) by mouth 3 (three) times daily for 10 days. 07/26/18 08/05/18  Pabon, Diego F, MD  nicotine (NICODERM CQ - DOSED IN MG/24 HOURS) 21 mg/24hr patch Place 1 patch (21 mg total) onto the skin daily. 07/16/18   Nicholes Mango, MD    Family History  Problem Relation Age of Onset  . Dementia Mother   . Alzheimer's disease Mother   . CVA Father   . Cancer - Colon Father   . Heart attack Brother   . Hypertension Brother      Social History   Tobacco Use  . Smoking status: Current Every Day Smoker    Packs/day: 1.00    Years: 40.00    Pack years: 40.00    Types: Cigarettes  . Smokeless tobacco: Current User  Substance Use Topics  . Alcohol use: Yes    Comment: 1/5 of vodka per month   . Drug use: Yes    Allergies as of 08/02/2018 - Review Complete 07/28/2018  Allergen Reaction Noted  .  Penicillins Itching and Rash 07/03/2011    Review of Systems:    All systems reviewed and negative except where noted in HPI.   Physical Exam:  There were no vitals taken for this visit. No LMP for male patient. General:   Alert,  Well-developed, well-nourished, pleasant and cooperative in NAD Head:  Normocephalic and atraumatic. Eyes:  Sclera clear, no icterus.   Conjunctiva pink. Ears:  Normal auditory acuity. Nose:  No deformity, discharge, or lesions. Mouth:  No deformity or lesions,oropharynx pink & moist. Neck:  Supple; no masses or thyromegaly. Lungs:  Respirations even and unlabored.  Clear throughout to auscultation.   No wheezes, crackles, or rhonchi. No acute distress. Heart:  Regular rate and rhythm; no murmurs, clicks, rubs, or  gallops. Abdomen:  Normal bowel sounds.  No bruits.  Soft, non-tender and non-distended without masses, hepatosplenomegaly or hernias noted.  No guarding or rebound tenderness.  Negative Carnett sign.   Rectal:  Deferred.  Msk:  Symmetrical without gross deformities.  Good, equal movement & strength bilaterally. Pulses:  Normal pulses noted. Extremities:  No clubbing or edema.  No cyanosis. Neurologic:  Alert and oriented x3;  grossly normal neurologically. Skin:  Intact without significant lesions or rashes.  No jaundice. Lymph Nodes:  No significant cervical adenopathy. Psych:  Alert and cooperative. Normal mood and affect.  Imaging Studies: Dg Chest 1 View  Result Date: 07/06/2018 CLINICAL DATA:  Preop.  Fever EXAM: CHEST  1 VIEW COMPARISON:  None. FINDINGS: Mild cardiomegaly. No confluent airspace opacities, effusions or edema. No acute bony abnormality. IMPRESSION: Mild cardiomegaly.  No active disease. Electronically Signed   By: Rolm Baptise M.D.   On: 07/06/2018 19:54   Ct Femur Left W Contrast  Result Date: 07/10/2018 CLINICAL DATA:  Wound infection. Left thigh abscess versus cellulitis. EXAM: CT OF THE LOWER LEFT EXTREMITY WITH CONTRAST TECHNIQUE: Multidetector CT imaging of the left thigh was performed according to the standard protocol following intravenous contrast administration. COMPARISON:  CT and ultrasound 07/06/2018. CONTRAST:  136mL OMNIPAQUE IOHEXOL 300 MG/ML  SOLN FINDINGS: Bones/Joint/Cartilage No evidence of acute fracture, dislocation or bone destruction. Stable subchondral cyst formation in the left acetabulum. Mild left hip and left knee degenerative changes. No significant hip or joint effusion. Ligaments Suboptimally evaluated by CT. The cruciate ligaments appear intact at the knee. Muscles and Tendons The left thigh muscles and tendons appear normal. The extensor mechanism is intact at the knee. Soft tissues Stable probable reactive left pelvic and inguinal adenopathy,  measuring up to 19 mm short axis on image 122/8. There is diffuse dermal thickening and subcutaneous edema medially in the proximal left thigh. The previously demonstrated complex fluid collections are very similar to the previous study, although there is 1 component which appears slightly larger, measuring 6.9 x 4.5 cm on image 163/7 (previously 5.9 x 3.2 cm). The superior to inferior extent is similar, approximately 17 cm. The collections appear unchanged without peripheral enhancement, emphysema or foreign body. There are no new fluid collections. There is stable mild subcutaneous edema laterally in the distal thigh. IMPRESSION: 1. Little change is seen from the prior study of 4 days ago. Findings consistent with persistent medial left thigh cellulitis. Complex subcutaneous fluid collections medially are similar in overall appearance, although one is slightly larger. These remain nonspecific, but could reflect early abscesses. They were visible on prior ultrasound and could be aspirated under ultrasound guidance. 2. No foreign body or soft tissue emphysema. No evidence of deep infection. 3. No  evidence of osteomyelitis. Electronically Signed   By: Richardean Sale M.D.   On: 07/10/2018 11:43   Ct Femur Left W Contrast  Result Date: 07/06/2018 CLINICAL DATA:  Left thigh abscess. EXAM: CT OF THE LOWER RIGHT EXTREMITY WITH CONTRAST TECHNIQUE: Multidetector CT imaging of the lower right extremity was performed according to the standard protocol following intravenous contrast administration. COMPARISON:  None. CONTRAST:  160mL OMNIPAQUE IOHEXOL 300 MG/ML  SOLN FINDINGS: No fracture or other acute bony abnormality is noted. No definite abnormality seen in the visualized portion of the pelvis. Mildly enlarged left inguinal lymph nodes are noted which most likely are inflammatory in etiology. Stranding of the subcutaneous tissues of the medial and anterior aspect of the proximal left thigh is noted most consistent  with cellulitis. There are multiple irregular fluid collections seen in this area which potentially could represent possible developing abscess or abscesses. IMPRESSION: Findings consistent with cellulitis involving the subcutaneous tissues involving the medial and anterior aspect of the proximal left thigh. Multiple irregular low densities are noted within this area concerning for small fluid collections with ill-defined margins; potentially these may represent multiple developing abscesses. However, no definite wall enhancement is noted at this time. Electronically Signed   By: Marijo Conception, M.D.   On: 07/06/2018 20:11   US Venous Img Lower Unilateral Left  Result Date: 07/06/2018 CLINICAL DATA:  63 year old male with left lower extremity redness, swelling, cellulitis and possible abscess. EXAM: LEFT LOWER EXTREMITY VENOUS DOPPLER ULTRASOUND TECHNIQUE: Gray-scale sonography with graded compression, as well as color Doppler and duplex ultrasound were performed to evaluate the lower extremity deep venous systems from the level of the common femoral vein and including the common femoral, femoral, profunda femoral, popliteal and calf veins including the posterior tibial, peroneal and gastrocnemius veins when visible. The superficial great saphenous vein was also interrogated. Spectral Doppler was utilized to evaluate flow at rest and with distal augmentation maneuvers in the common femoral, femoral and popliteal veins. COMPARISON:  None. FINDINGS: Contralateral Common Femoral Vein: Respiratory phasicity is normal and symmetric with the symptomatic side. No evidence of thrombus. Normal compressibility. Common Femoral Vein: No evidence of thrombus. Normal compressibility, respiratory phasicity and response to augmentation. Saphenofemoral Junction: No evidence of thrombus. Normal compressibility and flow on color Doppler imaging. Profunda Femoral Vein: No evidence of thrombus. Normal compressibility and flow on  color Doppler imaging. Femoral Vein: No evidence of thrombus. Normal compressibility, respiratory phasicity and response to augmentation. Popliteal Vein: No evidence of thrombus. Normal compressibility, respiratory phasicity and response to augmentation. Calf Veins: No evidence of thrombus. Normal compressibility and flow on color Doppler imaging. Superficial Great Saphenous Vein: No evidence of thrombus. Normal compressibility. Venous Reflux:  None. Other Findings: Complex fluid collection in the medial upper thigh in the region of the inguinal crease measures approximately 11.5 x 3.0 by 7.8 cm. No evidence of internal vascularity on color Doppler imaging to suggest a solid mass. IMPRESSION: No evidence of deep venous thrombosis. Complex fluid collection in the medial upper thigh measures approximately 11.5 x 3.0 x 7.8 cm. Differential considerations include subcutaneous abscess versus hematoma. Electronically Signed   By: Jacqulynn Cadet M.D.   On: 07/06/2018 16:58   [place] Ekg Site Rite  Result Date: 07/12/2018 If Site Rite image not attached, placement could not be confirmed due to current cardiac rhythm.   Assessment and Plan:   Quay Simkin is a 63 y.o. y/o male who was recently discharged from the hospital after having surgery for  a groin abscess. The patient has been found to have chronic hepatitis C with a positive viral load.  The patient states he used IV drugs back in 1988.  The patient has not been treated in the past.  He also has never had a screening colonoscopy.  The patient has been told that he should have a fibrosis scan with ultrasound to look for extent of his liver disease.  He should also have a genotype sent off.  The patient was told about the risks of colon cancer in that he a screening colonoscopy.  The patient and informed me that he does not have any insurance and is in the middle of getting an application into [facility] for help.  The patient does want to go ahead with the hepatitis C  workup and we can see if he can get the medication for free once the tests are back so that can treat his hepatitis C.  The patient has been explained the plan and agrees with it.  Lucilla Lame, MD. Marval Regal    Note: This dictation was prepared with Dragon dictation along with smaller phrase technology. Any transcriptional errors that result from this process are unintentional.

## 2018-08-03 ENCOUNTER — Telehealth: Payer: Self-pay | Admitting: Surgery

## 2018-08-03 NOTE — Telephone Encounter (Signed)
Talk with Corey James at [facility]. She was calling because patient wanted more pain medication. Told her about the phone note on 08/01/2018. Corey James also states when she is changing the wound vac. She has noticed a lot of blood in the canister, she just wanted Dr. Dahlia Byes to know.  The patient is coming in on Wednesday to see Dr. Dahlia Byes . Sent message to Dr. Dahlia Byes.

## 2018-08-03 NOTE — Telephone Encounter (Signed)
Keish with [facility] is calling and is asking for one of the nurses to give her a call back when you have a chance. Just need to discuss about the wound vac. Please call patient and advise. Phone number [phone]

## 2018-08-04 ENCOUNTER — Other Ambulatory Visit: Payer: Self-pay | Admitting: *Deleted

## 2018-08-04 DIAGNOSIS — Z09 Encounter for follow-up examination after completed treatment for conditions other than malignant neoplasm: Secondary | ICD-10-CM

## 2018-08-04 NOTE — Progress Notes (Signed)
Put lab in at [facility] . Call patient to let him know to get his labs done before this appt on 08/09/2018. Marland KitchenPlease advised. I left him a message to call office back .

## 2018-08-04 NOTE — Telephone Encounter (Signed)
Put lab in at [facility] . Call patient to let him know to get his labs done before this appt on 08/09/2018. Marland KitchenPlease advised. I left him a message to call office back .

## 2018-08-04 NOTE — Telephone Encounter (Signed)
Please make sure pt gets cbc before next appt.

## 2018-08-07 ENCOUNTER — Ambulatory Visit: Payer: Self-pay

## 2018-08-07 ENCOUNTER — Telehealth: Payer: Self-pay

## 2018-08-07 NOTE — Telephone Encounter (Signed)
Received a call from Maudie Mercury, [place] department at [facility]. Pt was a no show for his RUQ abdominal US today.

## 2018-08-08 ENCOUNTER — Other Ambulatory Visit
Admission: RE | Admit: 2018-08-08 | Discharge: 2018-08-08 | Disposition: A | Discharge status: Home / Self Care | Payer: Medicaid Other | Source: Ambulatory Visit | Attending: Surgery | Admitting: Surgery

## 2018-08-08 ENCOUNTER — Telehealth: Payer: Self-pay | Admitting: Gastroenterology

## 2018-08-08 ENCOUNTER — Other Ambulatory Visit
Admission: RE | Admit: 2018-08-08 | Discharge: 2018-08-08 | Disposition: A | Discharge status: Home / Self Care | Payer: Medicaid Other | Source: Ambulatory Visit | Attending: Gastroenterology | Admitting: Gastroenterology

## 2018-08-08 ENCOUNTER — Other Ambulatory Visit: Payer: Self-pay

## 2018-08-08 DIAGNOSIS — B182 Chronic viral hepatitis C: Secondary | ICD-10-CM

## 2018-08-08 DIAGNOSIS — Z09 Encounter for follow-up examination after completed treatment for conditions other than malignant neoplasm: Principal | ICD-10-CM

## 2018-08-08 LAB — CBC
HCT: 41.6 % (ref 39.0–52.0)
Hemoglobin: 13.7 g/dL (ref 13.0–17.0)
MCH: 32.2 pg (ref 26.0–34.0)
MCHC: 32.9 g/dL (ref 30.0–36.0)
MCV: 97.7 fL (ref 80.0–100.0)
Platelets: 148 10*3/uL — ABNORMAL LOW (ref 150–400)
RBC: 4.26 MIL/uL (ref 4.22–5.81)
RDW: 13.2 % (ref 11.5–15.5)
WBC: 5.4 10*3/uL (ref 4.0–10.5)
nRBC: 0 % (ref 0.0–0.2)

## 2018-08-08 NOTE — Telephone Encounter (Signed)
Contacted pt and reschedule RUQ [place] to 08/22/18 at 9:30am at [facility].

## 2018-08-08 NOTE — Telephone Encounter (Signed)
Corey James with [facility] called & stated the patient was there to have his u/s but had eaten and they where unable to do. He was scheduled for the u/s on 08-07-2018. Please call patient. There was also questions about lab work.

## 2018-08-08 NOTE — Telephone Encounter (Signed)
Pt  Is calling to speak to Ginger he needs another order for U/S please call cb [phone]

## 2018-08-08 NOTE — Telephone Encounter (Signed)
Patient states he is going to get his lab work done today .

## 2018-08-09 ENCOUNTER — Encounter: Payer: Self-pay | Admitting: *Deleted

## 2018-08-09 ENCOUNTER — Ambulatory Visit (INDEPENDENT_AMBULATORY_CARE_PROVIDER_SITE_OTHER): Payer: Self-pay | Admitting: Surgery

## 2018-08-09 ENCOUNTER — Encounter: Payer: Self-pay | Admitting: Surgery

## 2018-08-09 ENCOUNTER — Other Ambulatory Visit: Payer: Self-pay

## 2018-08-09 VITALS — BP 169/86 | HR 96 | Temp 97.9°F | Ht 69.0 in | Wt 331.0 lb

## 2018-08-09 DIAGNOSIS — D696 Thrombocytopenia, unspecified: Secondary | ICD-10-CM

## 2018-08-09 DIAGNOSIS — Z09 Encounter for follow-up examination after completed treatment for conditions other than malignant neoplasm: Secondary | ICD-10-CM

## 2018-08-09 NOTE — Progress Notes (Signed)
Patient has been referred to establish primary care with [facility]. The patient has been scheduled for an appointment with [name], NP for 08-23-18 at 10:20 am.  The patient will return to the office in 3 weeks to see Dr. Dahlia Byes as scheduled- 08-30-18 at 9:15 am. Patient is aware to have the following labs drawn prior to appointment: CBC and CMP.

## 2018-08-09 NOTE — Progress Notes (Signed)
Per Dr.Pabon, [facility] nurse has been advised to cut the dressing 11 cm long and not so thick and to take time to apply the dressing correctly she stated understanding. [phone]

## 2018-08-09 NOTE — Patient Instructions (Addendum)
Patient will need to return to the office in 3 weeks. Repeat lab work before 3 week visit (CBC) Please tell [facility] aid to cut the dressing 11 cm long and not so thick to take time to apply the dressing correctly. Refer patient to see a PCP.    Call the office with any questions or concerns.

## 2018-08-10 ENCOUNTER — Telehealth: Payer: Self-pay

## 2018-08-10 LAB — HEPATITIS C GENOTYPE

## 2018-08-10 NOTE — Telephone Encounter (Signed)
[name] from [facility] stated that Corey James has a hard time keeping on the wound vac she stated that the vac was changed on 08/09/2018, and that she had to go and change it again today (08/10/2018) she stated she feels the patient will benefit from wet to dry wound changes due to the patient's inability to keep the vac in place. She stated that the agency can not go to the patient's home daily if the order does not specify this in writing.

## 2018-08-11 ENCOUNTER — Other Ambulatory Visit: Payer: Self-pay

## 2018-08-11 ENCOUNTER — Telehealth: Payer: Self-pay

## 2018-08-11 DIAGNOSIS — B182 Chronic viral hepatitis C: Secondary | ICD-10-CM

## 2018-08-11 NOTE — Telephone Encounter (Signed)
Pt notified of results. Also, advised pt per Dr. Allen Norris, he is okay with patient getting the fibrosure lab test so we can move forward with his Hep C treatment. We will reschedule him for the RUQ [place] when the restriction is over with [facility].

## 2018-08-11 NOTE — Telephone Encounter (Signed)
-----   Message from Lucilla Lame, MD sent at 08/10/2018  7:34 PM EDT ----- The patient needs to be started on treatment for his hepatitis C when all of his labs are back.

## 2018-08-11 NOTE — Telephone Encounter (Signed)
Thank you :)

## 2018-08-11 NOTE — Telephone Encounter (Signed)
May do wet/dry

## 2018-08-14 ENCOUNTER — Encounter: Payer: Self-pay | Admitting: Surgery

## 2018-08-14 ENCOUNTER — Telehealth: Payer: Self-pay | Admitting: Gastroenterology

## 2018-08-14 ENCOUNTER — Other Ambulatory Visit
Admission: RE | Admit: 2018-08-14 | Discharge: 2018-08-14 | Disposition: A | Discharge status: Home / Self Care | Payer: Medicaid Other | Source: Ambulatory Visit | Attending: Gastroenterology | Admitting: Gastroenterology

## 2018-08-14 ENCOUNTER — Other Ambulatory Visit: Payer: Self-pay

## 2018-08-14 DIAGNOSIS — B182 Chronic viral hepatitis C: Secondary | ICD-10-CM | POA: Diagnosis present

## 2018-08-14 NOTE — Telephone Encounter (Signed)
Noted. Pt will be contacted once lab results are available.

## 2018-08-14 NOTE — Progress Notes (Addendum)
S/p left thigh debridement Wound vac has been an issue for him since it leaks. Everytime I have changed it it last 2-3 days. He seems to think that home health is rushing and not doing a thorough job Otherwise doing well no fevers or chills Cbc shows thrombocytopenia, d/w the opt apparently he does abuses etoh, counsel him about cessation No evidence of active bleeding or easy brusing. Mild sharp pain on left thigh.Improving.    PE:NAD Wound  vac removed. Good granulation tissue 11ccm piece of foam tailored to the wound. Now measure 11x 2.5 cms. No infection and good seal    A/p Doing well If wound vac issue continue may change to wet/dry Cbc next visit to follow PL No need for surgical intervention I spent at least 15 min in this encounter w > 50% spent in coordination and counsleing

## 2018-08-14 NOTE — Care Management (Signed)
Post discharge note entry 08/14/18- Patient came to [facility] in self-propelled hospital wheelchair requesting update on Medicaid application from his last visit. He is pending ortho follow up with [name] PA. He is concerned about cost for MRI.  I have contacted patient by phone [phone], after reviewing benefit  to let him know that per [facility], he does not have Medicaid coverage at this time.  I have provided contact number for  Medicaid eligibility to patient [phone].

## 2018-08-14 NOTE — Telephone Encounter (Signed)
Patient called letting us know he had his labs drawn today.

## 2018-08-14 NOTE — Addendum Note (Signed)
Addended by: Caroleen Hamman F on: 08/14/2018 11:57 AM   Modules accepted: Level of Service

## 2018-08-16 LAB — HCV FIBROSURE
ALPHA 2-MACROGLOBULINS, QN: 299 mg/dL — ABNORMAL HIGH (ref 110–276)
ALT (SGPT) P5P: 40 IU/L (ref 0–55)
Apolipoprotein A-1: 132 mg/dL (ref 101–178)
Bilirubin, Total: 0.4 mg/dL (ref 0.0–1.2)
Fibrosis Score: 0.5 — ABNORMAL HIGH (ref 0.00–0.21)
GGT: 31 IU/L (ref 0–65)
Haptoglobin: 102 mg/dL (ref 32–363)
Necroinflammat Activity Score: 0.28 — ABNORMAL HIGH (ref 0.00–0.17)

## 2018-08-17 ENCOUNTER — Telehealth: Payer: Self-pay

## 2018-08-17 NOTE — Telephone Encounter (Signed)
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  He should be started on treatment when available.

## 2018-08-17 NOTE — Telephone Encounter (Signed)
Pt notified of lab results. Paperwork completed and faxed to [employer].

## 2018-08-18 ENCOUNTER — Encounter: Payer: Self-pay | Admitting: *Deleted

## 2018-08-22 ENCOUNTER — Ambulatory Visit: Payer: Self-pay

## 2018-08-23 ENCOUNTER — Telehealth: Payer: Self-pay | Admitting: Gastroenterology

## 2018-08-23 ENCOUNTER — Ambulatory Visit (INDEPENDENT_AMBULATORY_CARE_PROVIDER_SITE_OTHER): Payer: Self-pay | Admitting: Family Medicine

## 2018-08-23 NOTE — Telephone Encounter (Signed)
PT left vm he was supposed to receive hepatitis pills and has not received them please call pt

## 2018-08-23 NOTE — Telephone Encounter (Signed)
Pt notified a patient assistance form was mailed to him last week as we discussed. Pt advised to fill this out when he receives it and mail it to the manufacturer

## 2018-08-24 NOTE — Progress Notes (Signed)
Error - patient no-showed to e-visit

## 2018-08-28 ENCOUNTER — Other Ambulatory Visit: Payer: Self-pay

## 2018-08-28 ENCOUNTER — Telehealth (INDEPENDENT_AMBULATORY_CARE_PROVIDER_SITE_OTHER): Payer: Self-pay | Admitting: Surgery

## 2018-08-28 ENCOUNTER — Telehealth: Payer: Self-pay | Admitting: Gastroenterology

## 2018-08-28 DIAGNOSIS — L03119 Cellulitis of unspecified part of limb: Secondary | ICD-10-CM

## 2018-08-28 DIAGNOSIS — L02419 Cutaneous abscess of limb, unspecified: Secondary | ICD-10-CM

## 2018-08-28 NOTE — Progress Notes (Signed)
Telemedicine Surgical Follow Up  08/28/2018  Corey James is an 63 y.o. male.   No chief complaint on file.   HPI:  Pt doing well, no issues, wound vac doing well, no leaks. No fevers or chills. No pain and no concerns Location of the patient:home Time spent on call: 3 min Total Time spent in the encounter including counseling and coordination of care: 5 Location of the Provider:office The patient had given consent for a telemedicine visit and they understands the limitations associated with this including but not limited to privacy, cyber security and technology issues. Persons participating in the visit: pt    Past Medical History:  Diagnosis Date  . Disc degeneration, lumbosacral     Past Surgical History:  Procedure Laterality Date  . APPLICATION OF WOUND VAC Left 07/14/2018   Procedure: WOUND VAC CHANGE;  Surgeon: Olean Ree, MD;  Location: [facility];  Service: General;  Laterality: Left;  . INCISION AND DRAINAGE ABSCESS Left 07/11/2018   Procedure: INCISION AND DRAINAGE ABSCESS-LEFT THIGH;  Surgeon: [name], MD;  Location: [facility];  Service: General;  Laterality: Left;  . WOUND DEBRIDEMENT Left 07/14/2018   Procedure: POSSIBLE DEBRIDEMENT WOUND-LEFT THIGH;  Surgeon: Olean Ree, MD;  Location: [facility];  Service: General;  Laterality: Left;    Family History  Problem Relation Age of Onset  . Dementia Mother   . Alzheimer's disease Mother   . CVA Father   . Cancer - Colon Father   . Heart attack Brother   . Hypertension Brother     Social History:  reports that he has been smoking cigarettes. He has a 40.00 pack-year smoking history. He uses smokeless tobacco. He reports current alcohol use. He reports current drug use.  Allergies:  Allergies  Allergen Reactions  . Penicillins Itching and Rash    Medications reviewed.    ROS Full ROS performed and is otherwise negative other than what is stated in HPI   Assessment/Plan: Doing well face to face  visit in 6 wks. Advice to keep his upcoming labs to make sure Pl are stable. The pt was provided an opportunity to ask questions and all were answered. The pt was advised to call back or seek in-person evaluation if the symptoms worsen or if the condition fails to improve as anticipated.   Caroleen Hamman, MD [facility] General Surgeon

## 2018-08-28 NOTE — Telephone Encounter (Signed)
Advised pt the mail is most likely slow due to the COVID-19. I will have Sherren Mocha with [employer] to refax another form to our office for pt to pick up. He will stop in on Thursday, April 9th if does not receive it by mail by then.

## 2018-08-28 NOTE — Telephone Encounter (Signed)
Pt left vm he was supposed to receive Paper work from [employer] send to [place] he  Finds it hard to believe the mail runs this slow.

## 2018-08-28 NOTE — Telephone Encounter (Signed)
Pt left another vm regarding his paper work for Hepatitis  rx he has not received it yet

## 2018-08-30 ENCOUNTER — Encounter: Payer: Self-pay | Admitting: Surgery

## 2018-08-31 ENCOUNTER — Telehealth: Payer: Self-pay | Admitting: Gastroenterology

## 2018-08-31 ENCOUNTER — Other Ambulatory Visit: Payer: Self-pay

## 2018-08-31 ENCOUNTER — Ambulatory Visit (INDEPENDENT_AMBULATORY_CARE_PROVIDER_SITE_OTHER): Payer: Self-pay | Admitting: Family Medicine

## 2018-08-31 ENCOUNTER — Encounter: Payer: Self-pay | Admitting: Family Medicine

## 2018-08-31 VITALS — BP 138/82 | HR 94 | Temp 97.8°F | Resp 18 | Ht 69.0 in | Wt 343.7 lb

## 2018-08-31 DIAGNOSIS — Z114 Encounter for screening for human immunodeficiency virus [HIV]: Secondary | ICD-10-CM

## 2018-08-31 DIAGNOSIS — Z1212 Encounter for screening for malignant neoplasm of rectum: Secondary | ICD-10-CM

## 2018-08-31 DIAGNOSIS — F341 Dysthymic disorder: Secondary | ICD-10-CM

## 2018-08-31 DIAGNOSIS — Z1211 Encounter for screening for malignant neoplasm of colon: Secondary | ICD-10-CM

## 2018-08-31 DIAGNOSIS — Z716 Tobacco abuse counseling: Secondary | ICD-10-CM

## 2018-08-31 DIAGNOSIS — B182 Chronic viral hepatitis C: Secondary | ICD-10-CM

## 2018-08-31 DIAGNOSIS — L02419 Cutaneous abscess of limb, unspecified: Secondary | ICD-10-CM

## 2018-08-31 DIAGNOSIS — G8929 Other chronic pain: Secondary | ICD-10-CM

## 2018-08-31 DIAGNOSIS — M1610 Unilateral primary osteoarthritis, unspecified hip: Secondary | ICD-10-CM

## 2018-08-31 DIAGNOSIS — Z6841 Body Mass Index (BMI) 40.0 and over, adult: Secondary | ICD-10-CM

## 2018-08-31 DIAGNOSIS — R351 Nocturia: Secondary | ICD-10-CM | POA: Insufficient documentation

## 2018-08-31 DIAGNOSIS — M5441 Lumbago with sciatica, right side: Secondary | ICD-10-CM

## 2018-08-31 DIAGNOSIS — M5136 Other intervertebral disc degeneration, lumbar region: Secondary | ICD-10-CM

## 2018-08-31 MED ORDER — BUPROPION HCL ER (SR) 150 MG PO TB12: ORAL_TABLET | ORAL | 1 refills | Status: DC
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
## 2018-08-31 NOTE — Telephone Encounter (Signed)
Patient called in unsure of how to complete the paperwork. Please call to advise

## 2018-09-01 ENCOUNTER — Other Ambulatory Visit: Payer: Self-pay | Admitting: Family Medicine

## 2018-09-01 DIAGNOSIS — E782 Mixed hyperlipidemia: Secondary | ICD-10-CM

## 2018-09-01 LAB — COMPLETE METABOLIC PANEL WITH GFR
AG Ratio: 1.1 (calc) (ref 1.0–2.5)
ALT: 46 U/L (ref 9–46)
AST: 42 U/L — ABNORMAL HIGH (ref 10–35)
Albumin: 3.8 g/dL (ref 3.6–5.1)
Alkaline phosphatase (APISO): 55 U/L (ref 35–144)
BUN: 15 mg/dL (ref 7–25)
CO2: 26 mmol/L (ref 20–32)
Calcium: 9.1 mg/dL (ref 8.6–10.3)
Chloride: 101 mmol/L (ref 98–110)
Creat: 1 mg/dL (ref 0.70–1.25)
GFR, Est African American: 94 mL/min/{1.73_m2} (ref >=60)
GFR, Est Non African American: 81 mL/min/{1.73_m2} (ref >=60)
Globulin: 3.4 g/dL (calc) (ref 1.9–3.7)
Glucose, Bld: 96 mg/dL (ref 65–99)
Potassium: 4.4 mmol/L (ref 3.5–5.3)
Sodium: 137 mmol/L (ref 135–146)
Total Bilirubin: 0.6 mg/dL (ref 0.2–1.2)
Total Protein: 7.2 g/dL (ref 6.1–8.1)

## 2018-09-01 LAB — CBC WITH DIFFERENTIAL/PLATELET
Absolute Monocytes: 402 {cells}/uL (ref 200–950)
Basophils Absolute: 39 {cells}/uL (ref 0–200)
Basophils Relative: 0.7 %
Eosinophils Absolute: 231 {cells}/uL (ref 15–500)
Eosinophils Relative: 4.2 %
HCT: 44.1 % (ref 38.5–50.0)
Hemoglobin: 15.4 g/dL (ref 13.2–17.1)
Lymphs Abs: 1221 {cells}/uL (ref 850–3900)
MCH: 32.7 pg (ref 27.0–33.0)
MCHC: 34.9 g/dL (ref 32.0–36.0)
MCV: 93.6 fL (ref 80.0–100.0)
MPV: 10.2 fL (ref 7.5–12.5)
Monocytes Relative: 7.3 %
Neutro Abs: 3608 {cells}/uL (ref 1500–7800)
Neutrophils Relative %: 65.6 %
Platelets: 167 Thousand/uL (ref 140–400)
RBC: 4.71 Million/uL (ref 4.20–5.80)
RDW: 12 % (ref 11.0–15.0)
Total Lymphocyte: 22.2 %
WBC: 5.5 Thousand/uL (ref 3.8–10.8)

## 2018-09-01 LAB — LIPID PANEL
Cholesterol: 183 mg/dL
HDL: 39 mg/dL — ABNORMAL LOW
LDL Cholesterol (Calc): 117 mg/dL — ABNORMAL HIGH
Non-HDL Cholesterol (Calc): 144 mg/dL — ABNORMAL HIGH
Total CHOL/HDL Ratio: 4.7 (calc)
Triglycerides: 156 mg/dL — ABNORMAL HIGH

## 2018-09-01 LAB — PSA: PSA: 0.2 ng/mL

## 2018-09-01 LAB — TSH: TSH: 1.28 mIU/L (ref 0.40–4.50)

## 2018-09-01 LAB — HIV ANTIBODY (ROUTINE TESTING W REFLEX): HIV 1&2 Ab, 4th Generation: NONREACTIVE

## 2018-09-01 MED ORDER — ROSUVASTATIN CALCIUM 20 MG PO TABS: 20.0000 mg | ORAL_TABLET | Freq: Every day | ORAL | 3 refills | Status: DC

## 2018-09-03 ENCOUNTER — Emergency Department
Admission: EM | Admit: 2018-09-03 | Discharge: 2018-09-03 | Disposition: A | Discharge status: Home / Self Care | Payer: Medicaid Other | Attending: Emergency Medicine | Admitting: Emergency Medicine

## 2018-09-03 ENCOUNTER — Other Ambulatory Visit: Payer: Self-pay

## 2018-09-03 ENCOUNTER — Encounter: Payer: Self-pay | Admitting: Internal Medicine

## 2018-09-03 DIAGNOSIS — M549 Dorsalgia, unspecified: Secondary | ICD-10-CM | POA: Diagnosis not present

## 2018-09-03 DIAGNOSIS — Z5189 Encounter for other specified aftercare: Secondary | ICD-10-CM | POA: Diagnosis not present

## 2018-09-03 DIAGNOSIS — F17210 Nicotine dependence, cigarettes, uncomplicated: Secondary | ICD-10-CM | POA: Insufficient documentation

## 2018-09-03 DIAGNOSIS — L02416 Cutaneous abscess of left lower limb: Secondary | ICD-10-CM | POA: Insufficient documentation

## 2018-09-03 DIAGNOSIS — Z79899 Other long term (current) drug therapy: Secondary | ICD-10-CM | POA: Diagnosis not present

## 2018-09-03 NOTE — ED Notes (Signed)
Unable to obtain d/c e-signature.

## 2018-09-03 NOTE — ED Notes (Signed)
Pt stated to this RN that "I can't walk more than 15 feet" when I went in to review his d/c and follow-up instructions. While this RN was in another pt's room, I received a call from the [employer] stating the pt had walked to her station and "dropped his pants" and began "asking for someone to look at his wound". Pt was directed to return to ED Rm 25 and was observed walking one from one side of the ED to the other with any obvious signs of difficulty or distress. Pt was instructed to wait in room until this RN can come in to dress his wound with a bandage.

## 2018-09-03 NOTE — Discharge Instructions (Addendum)
Please seek medical attention for any high fevers, chest pain, shortness of breath, change in behavior, persistent vomiting, bloody stool or any other new or concerning symptoms.  

## 2018-09-03 NOTE — ED Notes (Signed)
Pt now denies feeling suicidal and demanding to leave. Pt was heard being verbally abusive to ED security and was escorted from the facility by ED security and [employer] officer.

## 2018-09-03 NOTE — ED Notes (Signed)
Pt stating he has no one to provide transportation home at d/c. Pt asking for the ED to arrange transportation to the shelter, but states he does not have a way to pay for a cab himself. Pt asking if he would be kept in the hospital if "I said I was suicidal". Pt was asked if he was having thoughts of hurting himself, and he stated "I will kill myself if I'm not going to be admitted into the hospital". Charge RN, Modena Nunnery, and Dr Archie Balboa made aware.

## 2018-09-03 NOTE — ED Provider Notes (Addendum)
[facility] Emergency Department Provider Note   ____________________________________________   I have reviewed the triage vital signs and the nursing notes.   HISTORY  Chief Complaint Wound Dehiscence   History limited by: Not Limited   HPI Corey James is a 63 y.o. male who presents to the emergency department today because of concerns for wound care.  Patient states that he had an abscess drained in his left thigh a number of weeks ago through surgery.  He states that he had a wound VAC on that until about 5 hours prior to my evaluation when it got accidentally removed.  Patient denies any other recent change with the wound.  He states that he is also having issues with back pain and sciatica.  Had an MRI performed a couple of days ago for this.    Records reviewed. Per medical record review patient has a history of MRI to lumbar spine performed a couple of days ago. No cauda equina.   Past Medical History:  Diagnosis Date  . Back pain   . Cellulitis   . Disc degeneration, lumbosacral   . Hepatitis C     Patient Active Problem List   Diagnosis Date Noted  . Chronic bilateral low back pain with right-sided sciatica 08/31/2018  . Class 3 severe obesity due to excess calories with serious comorbidity and body mass index (BMI) of 50.0 to 59.9 in adult ([place]) 08/31/2018  . Nocturia 08/31/2018  . Necrotizing soft tissue infection   . Abscess of left lower extremity   . Thigh abscess 07/06/2018  . Arthritis of hip 06/09/2018  . DDD (degenerative disc disease), lumbar 06/09/2018  . Morbid obesity with BMI of 45.0-49.9, adult ([place]) 06/09/2018    Past Surgical History:  Procedure Laterality Date  . APPLICATION OF WOUND VAC Left 07/14/2018   Procedure: WOUND VAC CHANGE;  Surgeon: Olean Ree, MD;  Location: [facility];  Service: General;  Laterality: Left;  . INCISION AND DRAINAGE ABSCESS Left 07/11/2018   Procedure: INCISION AND DRAINAGE ABSCESS-LEFT  THIGH;  Surgeon: [name], MD;  Location: [facility];  Service: General;  Laterality: Left;  . WOUND DEBRIDEMENT Left 07/14/2018   Procedure: POSSIBLE DEBRIDEMENT WOUND-LEFT THIGH;  Surgeon: Olean Ree, MD;  Location: [facility];  Service: General;  Laterality: Left;    Prior to Admission medications   Medication Sig Start Date End Date Taking? Authorizing Provider  acetaminophen (TYLENOL) 500 MG tablet Take 1 tablet (500 mg total) by mouth every 6 (six) hours as needed for mild pain or fever. 07/15/18   Nicholes Mango, MD  buPROPion (WELLBUTRIN SR) 150 MG 12 hr tablet Take 1 tablet once daily x3 days, then take 1 tablet twice daily. 08/31/18   [name], FNP  cyclobenzaprine (FLEXERIL) 10 MG tablet PLEASE SEE ATTACHED FOR DETAILED DIRECTIONS 08/18/18   [provider]  docusate sodium (COLACE) 100 MG capsule Take 100 mg by mouth 2 (two) times daily.    [provider]  gabapentin (NEURONTIN) 300 MG capsule Take 1 capsule (300 mg total) by mouth 3 (three) times daily for 10 days. 07/26/18 08/31/18  Pabon, Marjory Lies, MD  HYDROcodone-acetaminophen (NORCO) 7.5-325 MG tablet Take by mouth. 08/01/18   [provider]  nicotine (NICODERM CQ - DOSED IN MG/24 HOURS) 21 mg/24hr patch Place 1 patch (21 mg total) onto the skin daily. 07/16/18   Gouru, Illene Silver, MD  rosuvastatin (CRESTOR) 20 MG tablet Take 1 tablet (20 mg total) by mouth daily. 09/01/18  [name], FNP    Allergies Penicillins  Family History  Problem Relation Age of Onset  . Dementia Mother   . Alzheimer's disease Mother   . CVA Father   . Cancer - Colon Father   . Heart attack Brother   . Hypertension Brother     Social History Social History   Tobacco Use  . Smoking status: Current Every Day Smoker    Packs/day: 1.00    Years: 40.00    Pack years: 40.00    Types: Cigarettes  . Smokeless tobacco: Current User  . Tobacco comment: Wellbutrin  Substance Use Topics  . Alcohol use: Yes    Comment:  1/5 of vodka per month   . Drug use: Yes    Types: Marijuana    Review of Systems Constitutional: No fever/chills Eyes: No visual changes. ENT: No sore throat. Cardiovascular: Denies chest pain. Respiratory: Denies shortness of breath. Gastrointestinal: No abdominal pain.  No nausea, no vomiting.  No diarrhea.   Genitourinary: Negative for dysuria. Musculoskeletal: Positive for back pain.  Skin: Negative for rash. Neurological: Negative for headaches, focal weakness or numbness.  ____________________________________________   PHYSICAL EXAM:  VITAL SIGNS: ED Triage Vitals  Enc Vitals Group     BP --      Pulse --      Resp --      Temp --      Temp src --      SpO2 --      Weight 09/03/18 0207 (!) 341 lb 11.4 oz (155 kg)     Height 09/03/18 0207 5\' 9"  (1.753 m)     Head Circumference --      Peak Flow --      Pain Score 09/03/18 0206 9   Constitutional: Alert and oriented.  Eyes: Conjunctivae are normal.  ENT      Head: Normocephalic and atraumatic.      Nose: No congestion/rhinnorhea.      Mouth/Throat: Mucous membranes are moist.      Neck: No stridor. Cardiovascular: Normal rate, regular rhythm.  No murmurs, rubs, or gallops. Respiratory: Normal respiratory effort without tachypnea nor retractions. Breath sounds are clear and equal bilaterally. No wheezes/rales/rhonchi. Gastrointestinal: Soft and non tender. No rebound. No guarding.  Genitourinary: Deferred Musculoskeletal: Normal range of motion in all extremities. No lower extremity edema. Neurologic:  Normal speech and language. No gross focal neurologic deficits are appreciated.  Skin:  Wound noted to left upper thigh. No surrounding erythema. No malodor.  Psychiatric: Mood and affect are normal. Speech and behavior are normal. Patient exhibits appropriate insight and judgment.  ____________________________________________    LABS (pertinent  positives/negatives)  None  ____________________________________________   EKG  None  ____________________________________________    RADIOLOGY  None  ____________________________________________   PROCEDURES  Procedures  ____________________________________________   INITIAL IMPRESSION / ASSESSMENT AND PLAN / ED COURSE  Pertinent labs & imaging results that were available during my care of the patient were reviewed by me and considered in my medical decision making (see chart for details).   Patient presented to the emergency department today because of concern for wound to left upper thigh and the fact that the wound VAC got removed.  No obvious signs of current infection.  Discussed with patient he should follow-up with his surgeon to have it evaluated and possible wound vac replacement. In terms of his back pain, recent MRI without signs of cuada equina. Patient should continue follow up with outpatient providers.  When nursing staff went to discharge patient he commented that he had nowhere to go and asked if he would be kept in the hospital if he said he was suicidal. At this time given circumstance under which patient then stated he wanted to harm himself I have low suspicion of actual suicidal ideation. Do not feel patient requires IVC at this time however will order psychiatry evaluation.   When I went to discuss with the patient he stated he would just like to leave. Did have a discussion with the patient about what he meant. It appears he is frustrated about his living situation and that that played a role in the wound vac coming off. Discussed how he is also frustrated about difficulty with ambulation however he ambulated easily within the department. At this point patient would like to go and I do not feel he requires IVC at this time. Discussed importance of follow up.   ____________________________________________   FINAL CLINICAL IMPRESSION(S) / ED  DIAGNOSES  Final diagnoses:  Wound check, abscess     Note: This dictation was prepared with Dragon dictation. Any transcriptional errors that result from this process are unintentional     Nance Pear, MD 09/03/18 0626    Nance Pear, MD 09/03/18 9485    Nance Pear, MD 09/03/18 [phone]
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
## 2018-09-04 ENCOUNTER — Inpatient Hospital Stay
Admission: EM | Admit: 2018-09-04 | Discharge: 2018-09-10 | DRG: 872 | Disposition: A | Discharge status: Home Health | Payer: Medicaid Other | Attending: Internal Medicine | Admitting: Internal Medicine

## 2018-09-04 ENCOUNTER — Ambulatory Visit (INDEPENDENT_AMBULATORY_CARE_PROVIDER_SITE_OTHER): Payer: Self-pay | Admitting: Surgery

## 2018-09-04 ENCOUNTER — Other Ambulatory Visit: Payer: Self-pay

## 2018-09-04 ENCOUNTER — Emergency Department: Payer: Medicaid Other

## 2018-09-04 ENCOUNTER — Encounter: Payer: Self-pay | Admitting: Emergency Medicine

## 2018-09-04 ENCOUNTER — Encounter: Payer: Self-pay | Admitting: Surgery

## 2018-09-04 VITALS — BP 155/86 | HR 108 | Temp 98.4°F | Ht 69.0 in | Wt 340.6 lb

## 2018-09-04 DIAGNOSIS — R509 Fever, unspecified: Secondary | ICD-10-CM

## 2018-09-04 DIAGNOSIS — D696 Thrombocytopenia, unspecified: Secondary | ICD-10-CM | POA: Diagnosis present

## 2018-09-04 DIAGNOSIS — L03119 Cellulitis of unspecified part of limb: Secondary | ICD-10-CM

## 2018-09-04 DIAGNOSIS — M79662 Pain in left lower leg: Secondary | ICD-10-CM

## 2018-09-04 DIAGNOSIS — M1611 Unilateral primary osteoarthritis, right hip: Secondary | ICD-10-CM | POA: Diagnosis present

## 2018-09-04 DIAGNOSIS — M7989 Other specified soft tissue disorders: Secondary | ICD-10-CM

## 2018-09-04 DIAGNOSIS — Z6841 Body Mass Index (BMI) 40.0 and over, adult: Secondary | ICD-10-CM

## 2018-09-04 DIAGNOSIS — F329 Major depressive disorder, single episode, unspecified: Secondary | ICD-10-CM | POA: Diagnosis present

## 2018-09-04 DIAGNOSIS — L03116 Cellulitis of left lower limb: Secondary | ICD-10-CM | POA: Diagnosis not present

## 2018-09-04 DIAGNOSIS — E871 Hypo-osmolality and hyponatremia: Secondary | ICD-10-CM | POA: Diagnosis present

## 2018-09-04 DIAGNOSIS — L02419 Cutaneous abscess of limb, unspecified: Secondary | ICD-10-CM

## 2018-09-04 DIAGNOSIS — M5136 Other intervertebral disc degeneration, lumbar region: Secondary | ICD-10-CM | POA: Diagnosis present

## 2018-09-04 DIAGNOSIS — Z79899 Other long term (current) drug therapy: Secondary | ICD-10-CM

## 2018-09-04 DIAGNOSIS — E785 Hyperlipidemia, unspecified: Secondary | ICD-10-CM | POA: Diagnosis present

## 2018-09-04 DIAGNOSIS — L304 Erythema intertrigo: Secondary | ICD-10-CM | POA: Diagnosis present

## 2018-09-04 DIAGNOSIS — A419 Sepsis, unspecified organism: Principal | ICD-10-CM | POA: Diagnosis present

## 2018-09-04 DIAGNOSIS — F17210 Nicotine dependence, cigarettes, uncomplicated: Secondary | ICD-10-CM | POA: Diagnosis present

## 2018-09-04 DIAGNOSIS — J189 Pneumonia, unspecified organism: Secondary | ICD-10-CM

## 2018-09-04 LAB — BASIC METABOLIC PANEL
Anion gap: 8 (ref 5–15)
BUN: 22 mg/dL (ref 8–23)
CO2: 23 mmol/L (ref 22–32)
Calcium: 8.4 mg/dL — ABNORMAL LOW (ref 8.9–10.3)
Chloride: 100 mmol/L (ref 98–111)
Creatinine, Ser: 1.27 mg/dL — ABNORMAL HIGH (ref 0.61–1.24)
GFR calc Af Amer: >60 mL/min (ref >60)
GFR calc non Af Amer: >60 mL/min (ref >60)
Glucose, Bld: 214 mg/dL — ABNORMAL HIGH (ref 70–99)
Potassium: 4 mmol/L (ref 3.5–5.1)
Sodium: 131 mmol/L — ABNORMAL LOW (ref 135–145)

## 2018-09-04 LAB — URINALYSIS, COMPLETE (UACMP) WITH MICROSCOPIC
Bilirubin Urine: NEGATIVE
Glucose, UA: >=500 mg/dL — AB
Hgb urine dipstick: NEGATIVE
Ketones, ur: NEGATIVE mg/dL
Leukocytes,Ua: NEGATIVE
Nitrite: NEGATIVE
Protein, ur: 30 mg/dL — AB
Specific Gravity, Urine: 1.029 (ref 1.005–1.030)
Squamous Epithelial / HPF: NONE SEEN (ref 0–5)
pH: 5 (ref 5.0–8.0)

## 2018-09-04 LAB — LACTIC ACID, PLASMA: Lactic Acid, Venous: 1.4 mmol/L (ref 0.5–1.9)

## 2018-09-04 LAB — ETHANOL: Alcohol, Ethyl (B): <10 mg/dL (ref <10)

## 2018-09-04 MED ORDER — SULFAMETHOXAZOLE-TRIMETHOPRIM 800-160 MG PO TABS: 1.0000 | ORAL_TABLET | Freq: Two times a day (BID) | ORAL | 0 refills | Status: DC

## 2018-09-04 MED ORDER — SODIUM CHLORIDE 0.9 % IV BOLUS
1000.0000 mL | Freq: Once | INTRAVENOUS | Status: AC
  Administered 2018-09-04: 1000 mL via INTRAVENOUS

## 2018-09-04 MED ORDER — VANCOMYCIN HCL 10 G IV SOLR: 1.0000 g | Freq: Once | INTRAVENOUS | Status: DC

## 2018-09-04 MED ORDER — SODIUM CHLORIDE 0.9 % IV SOLN
1.0000 g | Freq: Once | INTRAVENOUS | Status: AC
  Administered 2018-09-04: 1 g via INTRAVENOUS
  Filled 2018-09-04: qty 10

## 2018-09-04 MED ORDER — VANCOMYCIN HCL IN DEXTROSE 1-5 GM/200ML-% IV SOLN: 1000.0000 mg | Freq: Once | INTRAVENOUS | Status: DC

## 2018-09-04 MED ORDER — ACETAMINOPHEN 325 MG PO TABS
650.0000 mg | ORAL_TABLET | Freq: Once | ORAL | Status: AC
  Administered 2018-09-04: 650 mg via ORAL
  Filled 2018-09-04: qty 2

## 2018-09-04 NOTE — Patient Instructions (Addendum)
Patient will need to be seen in the office in 3-4 days for wound check. And return on Monday to see Dr.Pabon. Please go to your pharmacy to pick up your medication.    Call the office with any questions or concerns.

## 2018-09-04 NOTE — ED Provider Notes (Addendum)
[facility] Emergency Department Provider Note  ____________________________________________   I have reviewed the triage vital signs and the nursing notes. Where available I have reviewed prior notes and, if possible and indicated, outside hospital notes.    HISTORY  Chief Complaint Recurrent Skin Infections    HPI Corey James is a 63 y.o. male with a history of MSSA and an abscess in his left thigh with recurrent infections who had a wound VAC that recently came off, also has some issues with his housing, seen here yesterday, please see Dr. Gennette Pac note.  Also saw his surgeon today which thought things looked pretty good.  He went home developed a fever increased redness and pain in that leg and is here today.  No vomiting no nausea no diarrhea.  No cough.  He states he called Dr. Hampton Abbot on the phone and was told to come here for admission and IV antibiotics. Have personally reviewed Dr. Levell July notes from earlier today.  Culture results from prior.   Past Medical History:  Diagnosis Date  . Back pain   . Cellulitis   . Disc degeneration, lumbosacral   . Hepatitis C     Patient Active Problem List   Diagnosis Date Noted  . Chronic bilateral low back pain with right-sided sciatica 08/31/2018  . Class 3 severe obesity due to excess calories with serious comorbidity and body mass index (BMI) of 50.0 to 59.9 in adult ([place]) 08/31/2018  . Nocturia 08/31/2018  . Necrotizing soft tissue infection   . Abscess of left lower extremity   . Thigh abscess 07/06/2018  . Arthritis of hip 06/09/2018  . DDD (degenerative disc disease), lumbar 06/09/2018  . Morbid obesity with BMI of 45.0-49.9, adult ([place]) 06/09/2018    Past Surgical History:  Procedure Laterality Date  . APPLICATION OF WOUND VAC Left 07/14/2018   Procedure: WOUND VAC CHANGE;  Surgeon: Olean Ree, MD;  Location: [facility] ORS;  Service: General;  Laterality: Left;  . INCISION AND DRAINAGE ABSCESS  Left 07/11/2018   Procedure: INCISION AND DRAINAGE ABSCESS-LEFT THIGH;  Surgeon: [name], MD;  Location: [facility] ORS;  Service: General;  Laterality: Left;  . WOUND DEBRIDEMENT Left 07/14/2018   Procedure: POSSIBLE DEBRIDEMENT WOUND-LEFT THIGH;  Surgeon: Olean Ree, MD;  Location: [facility] ORS;  Service: General;  Laterality: Left;    Prior to Admission medications   Medication Sig Start Date End Date Taking? Authorizing Provider  acetaminophen (TYLENOL) 500 MG tablet Take 1 tablet (500 mg total) by mouth every 6 (six) hours as needed for mild pain or fever. 07/15/18   Nicholes Mango, MD  buPROPion (WELLBUTRIN SR) 150 MG 12 hr tablet Take 1 tablet once daily x3 days, then take 1 tablet twice daily. 08/31/18   [name], FNP  cyclobenzaprine (FLEXERIL) 10 MG tablet PLEASE SEE ATTACHED FOR DETAILED DIRECTIONS 08/18/18   [provider]  docusate sodium (COLACE) 100 MG capsule Take 100 mg by mouth 2 (two) times daily.    [provider]  gabapentin (NEURONTIN) 300 MG capsule Take 1 capsule (300 mg total) by mouth 3 (three) times daily for 10 days. 07/26/18 08/31/18  Pabon, Marjory Lies, MD  HYDROcodone-acetaminophen (NORCO) 7.5-325 MG tablet Take 1 tablet by mouth every 4 (four) hours as needed.  08/01/18   [provider]  nicotine (NICODERM CQ - DOSED IN MG/24 HOURS) 21 mg/24hr patch Place 1 patch (21 mg total) onto the skin daily. 07/16/18   Nicholes Mango, MD  rosuvastatin (CRESTOR)  20 MG tablet Take 1 tablet (20 mg total) by mouth daily. 09/01/18   [name], FNP  sulfamethoxazole-trimethoprim (BACTRIM DS,SEPTRA DS) 800-160 MG tablet Take 1 tablet by mouth 2 (two) times daily. 09/04/18   [name], MD    Allergies Penicillins  Family History  Problem Relation Age of Onset  . Dementia Mother   . Alzheimer's disease Mother   . CVA Father   . Cancer - Colon Father   . Heart attack Brother   . Hypertension Brother     Social History Social History   Tobacco Use   . Smoking status: Current Every Day Smoker    Packs/day: 1.00    Years: 40.00    Pack years: 40.00    Types: Cigarettes  . Smokeless tobacco: Current User  . Tobacco comment: Wellbutrin  Substance Use Topics  . Alcohol use: Yes    Comment: 1/5 of vodka per month   . Drug use: Yes    Types: Marijuana    Review of Systems Constitutional: + fever Eyes: No visual changes. ENT: No sore throat. No stiff neck no neck pain Cardiovascular: Denies chest pain. Respiratory: Denies shortness of breath. Gastrointestinal:   no vomiting.  No diarrhea.  No constipation. Genitourinary: Negative for dysuria. Musculoskeletal: Negative lower extremity swelling Skin: + for rash. Neurological: Negative for severe headaches, focal weakness or numbness.   ____________________________________________   PHYSICAL EXAM:  VITAL SIGNS: ED Triage Vitals  Enc Vitals Group     BP 09/04/18 2138 140/83     Pulse Rate 09/04/18 2138 (!) 115     Resp 09/04/18 2138 (!) 28     Temp 09/04/18 2138 (!) 101.5 F (38.6 C)     Temp Source 09/04/18 2138 Oral     SpO2 09/04/18 2138 96 %     Weight 09/04/18 2140 (!) 341 lb 11.4 oz (155 kg)     Height 09/04/18 2140 5\' 9"  (1.753 m)     Head Circumference --      Peak Flow --      Pain Score 09/04/18 2139 8     Pain Loc --      Pain Edu? --      Excl. in [place]? --     Constitutional: Alert and oriented. Well appearing and in no acute distress. Eyes: Conjunctivae are normal Head: Atraumatic HEENT: No congestion/rhinnorhea. Mucous membranes are moist.  Oropharynx non-erythematous Neck:   Nontender with no meningismus, no masses, no stridor Cardiovascular: Normal rate, regular rhythm. Grossly normal heart sounds.  Good peripheral circulation. Respiratory: Normal respiratory effort.  No retractions. Lungs CTAB. Abdominal: Soft and nontender. No distention. No guarding no rebound Back:  There is no focal tenderness or step off.  there is no midline tenderness there  are no lesions noted. there is no CVA tenderness Musculoskeletal: There is diffuse redness and erythema to the left thigh, there is no crepitus, there is no evidence of necrotizing fasciitis or gas gangrene, in the folds of the thigh, area of prior surgery,, there is some purulent discharge.  Positive granulation tissue.  Upper extremity tenderness. No joint effusions, no DVT signs strong distal pulses no edema Neurologic:  Normal speech and language. No gross focal neurologic deficits are appreciated.  Skin:  Skin is warm, dry and intact. No rash noted. Psychiatric: Mood and affect are normal. Speech and behavior are normal.  ____________________________________________   LABS (all labs ordered are listed, but only abnormal results are displayed)  Labs Reviewed  CULTURE, BLOOD (ROUTINE X 2)  CULTURE, BLOOD (ROUTINE X 2)  AEROBIC CULTURE (SUPERFICIAL SPECIMEN)  BASIC METABOLIC PANEL  CBC WITH DIFFERENTIAL/PLATELET  URINALYSIS, COMPLETE (UACMP) WITH MICROSCOPIC  URINE DRUG SCREEN, QUALITATIVE ([facility] ONLY)  LACTIC ACID, PLASMA  LACTIC ACID, PLASMA  ETHANOL    Pertinent labs  results that were available during my care of the patient were reviewed by me and considered in my medical decision making (see chart for details). ____________________________________________  EKG  I personally interpreted any EKGs ordered by me or triage  ____________________________________________  RADIOLOGY  Pertinent labs & imaging results that were available during my care of the patient were reviewed by me and considered in my medical decision making (see chart for details). If possible, patient and/or family made aware of any abnormal findings.  No results found. ____________________________________________    PROCEDURES  Procedure(s) performed: None  Procedures  Critical Care performed: None  ____________________________________________   INITIAL IMPRESSION / ASSESSMENT AND PLAN / ED  COURSE  Pertinent labs & imaging results that were available during my care of the patient were reviewed by me and considered in my medical decision making (see chart for details).  Patient here with evidence of worsening cellulitis with fever.  He states that he was told to come here by his surgeon who advised admission he was on the hospitalist service last time.  He does have a fever here.  Blood pressure is good.  Tachycardia could secondarily be to the fever.  We will obtain x-ray of the leg to make sure there is no evidence of gas gangrene although low suspicion given presentation, just to look for other possible sources of infection although I do not think there is likely 1 we will get a chest x-ray and I will also check a urinalysis.  I will resend the wound culture but it was pansensitive and with ceftriaxone should cover it given his fever I will give him ceftriaxone and Vanco pending cultures however.  We will give him fluid hydration and we will admit him to the hospital after antipyretics and results come back.  ----------------------------------------- 11:16 PM on 09/04/2018 -----------------------------------------  Signed out to dr. Jacqualine Code, blood work pending. Will require admission.    ____________________________________________   FINAL CLINICAL IMPRESSION(S) / ED DIAGNOSES  Final diagnoses:  Fever      This chart was dictated using voice recognition software.  Despite best efforts to proofread,  errors can occur which can change meaning.      Schuyler Amor, MD 09/04/18 2216    Schuyler Amor, MD 09/04/18 9833    Schuyler Amor, MD 09/04/18 2317

## 2018-09-04 NOTE — ED Triage Notes (Signed)
Pt arrived to the ED via POV from home for complaints of "cellulitis" on the left leg. Pt reports that he has the same symptoms about 2 weeks ago and got skin removed. Today he called the surgeon and he told him to come to the hospital to receive IV antibiotics. Pt is AOx4 in no apparent distress.

## 2018-09-04 NOTE — Progress Notes (Signed)
Outpatient Surgical Follow Up  09/04/2018  Corey James is an 63 y.o. male.   Chief Complaint  Patient presents with  . Follow-up     follow up for possible cellulitis    HPI: Corey James 63 year old morbidly obese male well-known to me with a history of soft tissue necrotizing infection months ago.  Underwent debridement and wound VAC placement.  Her last day or so he noticed that his cellulitis is back he noticed more redness around his left thigh.  The wound nurse called today to my office and they were concerned about his wound.  He recently went to the emergency room yesterday and was discharged.  He is known for leaving AMA been noncompliant.  He does have a history of hepatitis C. Last time he was noncompliant with a full course of antibiotic therapy.  I have reviewed his cultures and it grew streptococci C and 1 of those cultures also MSSA. Reports feeling hot but has not taken his temperature.  Past Medical History:  Diagnosis Date  . Back pain   . Cellulitis   . Disc degeneration, lumbosacral   . Hepatitis C     Past Surgical History:  Procedure Laterality Date  . APPLICATION OF WOUND VAC Left 07/14/2018   Procedure: WOUND VAC CHANGE;  Surgeon: Olean Ree, MD;  Location: [facility] ORS;  Service: General;  Laterality: Left;  . INCISION AND DRAINAGE ABSCESS Left 07/11/2018   Procedure: INCISION AND DRAINAGE ABSCESS-LEFT THIGH;  Surgeon: [name], MD;  Location: [facility] ORS;  Service: General;  Laterality: Left;  . WOUND DEBRIDEMENT Left 07/14/2018   Procedure: POSSIBLE DEBRIDEMENT WOUND-LEFT THIGH;  Surgeon: Olean Ree, MD;  Location: [facility] ORS;  Service: General;  Laterality: Left;    Family History  Problem Relation Age of Onset  . Dementia Mother   . Alzheimer's disease Mother   . CVA Father   . Cancer - Colon Father   . Heart attack Brother   . Hypertension Brother     Social History:  reports that he has been smoking cigarettes. He has a 40.00 pack-year smoking  history. He uses smokeless tobacco. He reports current alcohol use. He reports current drug use. Drug: Marijuana.  Allergies:  Allergies  Allergen Reactions  . Penicillins Itching and Rash    Medications reviewed.    ROS Full ROS performed and is otherwise negative other than what is stated in HPI   BP (!) 155/86   Pulse (!) 108   Temp 98.4 F (36.9 C) (Temporal)   Ht 5\' 9"  (1.753 m)   Wt (!) 340 lb 9.6 oz (154.5 kg)   SpO2 97%   BMI 50.30 kg/m   Physical Exam Vitals signs and nursing note reviewed.  Constitutional:      Appearance: Normal appearance. He is obese.  Cardiovascular:     Rate and Rhythm: Tachycardia present.     Pulses: Normal pulses.  Pulmonary:     Effort: Pulmonary effort is normal.     Breath sounds: No wheezing.  Chest:     Chest wall: No tenderness.  Skin:    General: Skin is warm and dry.     Capillary Refill: Capillary refill takes less than 2 seconds.     Comments: Left thigh with blanching erythema and some tenderness.  No evidence of necrotizing infection or evidence of abscess.  The wound is almost completely healed but there is about a 12 x 0.8 cm wound with beefy granulation tissue.  No need for  wound VAC at this time  Neurological:     General: No focal deficit present.     Mental Status: He is oriented to person, place, and time.  Psychiatric:        Mood and Affect: Mood normal.        Behavior: Behavior normal.        Thought Content: Thought content normal.        Judgment: Judgment normal.      Assessment/Plan: Current cellulitis to the left thigh.  I discussed with the patient about the importance of adhering to antibiotic therapy.  I will prescribe Bactrim.  Also discussed with her about getting seen in the emergency room for further work-up to include a potential DVT.  Patient reports that he has limited resources and he at this time is only willing to take p.o. antibiotics.  Given his conditions my recommendation is to see  this 3 days or so for another clinical appointment.  At that time we will reconsider admission to the hospital. Currently there is no evidence of necrotizing infection but I am worried about this patient developing 1 given his multiple comorbidities and previous infections.  He also smokes actively and apparently has a lot of social issues.  Again encouraged him to seek prompt attention if any of the alarming signs will develop.  He knows to call our office as well if he does not feel well and he feels that he is infection is progressing. Also understands the risk of not being compliant with antibiotic therapy or with medical directions. Greater than 50% of the 15 minutes  visit was spent in counseling/coordination of care   Caroleen Hamman, MD [facility]

## 2018-09-04 NOTE — Telephone Encounter (Signed)
Spoke with pt regarding his patient assistance paperwork. Pt has completed it and dropped it off in our [place] office.

## 2018-09-05 ENCOUNTER — Inpatient Hospital Stay: Payer: Medicaid Other

## 2018-09-05 ENCOUNTER — Encounter: Payer: Self-pay | Admitting: Radiology

## 2018-09-05 DIAGNOSIS — M5136 Other intervertebral disc degeneration, lumbar region: Secondary | ICD-10-CM | POA: Diagnosis present

## 2018-09-05 DIAGNOSIS — A419 Sepsis, unspecified organism: Secondary | ICD-10-CM | POA: Diagnosis present

## 2018-09-05 DIAGNOSIS — E871 Hypo-osmolality and hyponatremia: Secondary | ICD-10-CM | POA: Diagnosis not present

## 2018-09-05 DIAGNOSIS — L304 Erythema intertrigo: Secondary | ICD-10-CM | POA: Diagnosis present

## 2018-09-05 DIAGNOSIS — E8881 Metabolic syndrome: Secondary | ICD-10-CM | POA: Diagnosis not present

## 2018-09-05 DIAGNOSIS — L03116 Cellulitis of left lower limb: Secondary | ICD-10-CM | POA: Diagnosis not present

## 2018-09-05 DIAGNOSIS — Z716 Tobacco abuse counseling: Secondary | ICD-10-CM | POA: Diagnosis not present

## 2018-09-05 DIAGNOSIS — F17210 Nicotine dependence, cigarettes, uncomplicated: Secondary | ICD-10-CM | POA: Diagnosis present

## 2018-09-05 DIAGNOSIS — E785 Hyperlipidemia, unspecified: Secondary | ICD-10-CM | POA: Diagnosis present

## 2018-09-05 DIAGNOSIS — R509 Fever, unspecified: Secondary | ICD-10-CM | POA: Diagnosis not present

## 2018-09-05 DIAGNOSIS — Z6841 Body Mass Index (BMI) 40.0 and over, adult: Secondary | ICD-10-CM | POA: Diagnosis not present

## 2018-09-05 DIAGNOSIS — M1611 Unilateral primary osteoarthritis, right hip: Secondary | ICD-10-CM | POA: Diagnosis present

## 2018-09-05 DIAGNOSIS — F329 Major depressive disorder, single episode, unspecified: Secondary | ICD-10-CM | POA: Diagnosis present

## 2018-09-05 DIAGNOSIS — Z79899 Other long term (current) drug therapy: Secondary | ICD-10-CM | POA: Diagnosis not present

## 2018-09-05 DIAGNOSIS — D696 Thrombocytopenia, unspecified: Secondary | ICD-10-CM | POA: Diagnosis present

## 2018-09-05 LAB — CBC WITH DIFFERENTIAL/PLATELET
Abs Immature Granulocytes: 0.24 10*3/uL — ABNORMAL HIGH (ref 0.00–0.07)
Basophils Absolute: 0 10*3/uL (ref 0.0–0.1)
Basophils Relative: 0 %
Eosinophils Absolute: 0 10*3/uL (ref 0.0–0.5)
Eosinophils Relative: 0 %
HCT: 42.6 % (ref 39.0–52.0)
Hemoglobin: 14.4 g/dL (ref 13.0–17.0)
Immature Granulocytes: 2 %
Lymphocytes Relative: 3 %
Lymphs Abs: 0.4 10*3/uL — ABNORMAL LOW (ref 0.7–4.0)
MCH: 32.2 pg (ref 26.0–34.0)
MCHC: 33.8 g/dL (ref 30.0–36.0)
MCV: 95.3 fL (ref 80.0–100.0)
Monocytes Absolute: 0.2 10*3/uL (ref 0.1–1.0)
Monocytes Relative: 1 %
Neutro Abs: 12.7 10*3/uL — ABNORMAL HIGH (ref 1.7–7.7)
Neutrophils Relative %: 94 %
Platelets: 122 10*3/uL — ABNORMAL LOW (ref 150–400)
RBC: 4.47 MIL/uL (ref 4.22–5.81)
RDW: 12.6 % (ref 11.5–15.5)
WBC: 13.5 10*3/uL — ABNORMAL HIGH (ref 4.0–10.5)
nRBC: 0 % (ref 0.0–0.2)

## 2018-09-05 LAB — CBC
HCT: 46.7 % (ref 39.0–52.0)
Hemoglobin: 15.5 g/dL (ref 13.0–17.0)
MCH: 31.9 pg (ref 26.0–34.0)
MCHC: 33.2 g/dL (ref 30.0–36.0)
MCV: 96.1 fL (ref 80.0–100.0)
Platelets: 124 10*3/uL — ABNORMAL LOW (ref 150–400)
RBC: 4.86 MIL/uL (ref 4.22–5.81)
RDW: 12.6 % (ref 11.5–15.5)
WBC: 13.5 10*3/uL — ABNORMAL HIGH (ref 4.0–10.5)
nRBC: 0 % (ref 0.0–0.2)

## 2018-09-05 LAB — URINE DRUG SCREEN, QUALITATIVE (ARMC ONLY)
Amphetamines, Ur Screen: NOT DETECTED
Barbiturates, Ur Screen: NOT DETECTED
Benzodiazepine, Ur Scrn: NOT DETECTED
Cannabinoid 50 Ng, Ur ~~LOC~~: NOT DETECTED
Cocaine Metabolite,Ur ~~LOC~~: NOT DETECTED
MDMA (Ecstasy)Ur Screen: NOT DETECTED
Methadone Scn, Ur: NOT DETECTED
Opiate, Ur Screen: POSITIVE — AB
Phencyclidine (PCP) Ur S: NOT DETECTED
Tricyclic, Ur Screen: NOT DETECTED

## 2018-09-05 LAB — GLUCOSE, CAPILLARY: Glucose-Capillary: 176 mg/dL — ABNORMAL HIGH (ref 70–99)

## 2018-09-05 LAB — TSH: TSH: 0.666 u[IU]/mL (ref 0.350–4.500)

## 2018-09-05 MED ORDER — BUPROPION HCL ER (SR) 150 MG PO TB12
150.0000 mg | ORAL_TABLET | Freq: Two times a day (BID) | ORAL | Status: DC
  Administered 2018-09-05 – 2018-09-09 (×9): 150 mg via ORAL
  Filled 2018-09-05 (×12): qty 1

## 2018-09-05 MED ORDER — LABETALOL HCL 5 MG/ML IV SOLN: 10.0000 mg | INTRAVENOUS | Status: DC | PRN

## 2018-09-05 MED ORDER — ACETAMINOPHEN 650 MG RE SUPP: 650.0000 mg | Freq: Four times a day (QID) | RECTAL | Status: DC | PRN

## 2018-09-05 MED ORDER — INSULIN ASPART 100 UNIT/ML ~~LOC~~ SOLN: 0.0000 [IU] | Freq: Every day | SUBCUTANEOUS | Status: DC

## 2018-09-05 MED ORDER — PNEUMOCOCCAL VAC POLYVALENT 25 MCG/0.5ML IJ INJ
0.5000 mL | INJECTION | INTRAMUSCULAR | Status: DC
  Filled 2018-09-05: qty 0.5

## 2018-09-05 MED ORDER — LABETALOL HCL 5 MG/ML IV SOLN
5.0000 mg | INTRAVENOUS | Status: DC | PRN
  Filled 2018-09-05 (×2): qty 4

## 2018-09-05 MED ORDER — ONDANSETRON HCL 4 MG PO TABS: 4.0000 mg | ORAL_TABLET | Freq: Four times a day (QID) | ORAL | Status: DC | PRN

## 2018-09-05 MED ORDER — CYCLOBENZAPRINE HCL 10 MG PO TABS
5.0000 mg | ORAL_TABLET | Freq: Three times a day (TID) | ORAL | Status: DC | PRN
  Administered 2018-09-09: 22:00:00 5 mg via ORAL
  Filled 2018-09-05: qty 1

## 2018-09-05 MED ORDER — INSULIN ASPART 100 UNIT/ML ~~LOC~~ SOLN: 0.0000 [IU] | SUBCUTANEOUS | Status: DC

## 2018-09-05 MED ORDER — ONDANSETRON HCL 4 MG/2ML IJ SOLN: 4.0000 mg | Freq: Four times a day (QID) | INTRAMUSCULAR | Status: DC | PRN

## 2018-09-05 MED ORDER — SODIUM CHLORIDE 0.9 % IV SOLN: 1.0000 g | INTRAVENOUS | Status: DC

## 2018-09-05 MED ORDER — IOHEXOL 300 MG/ML  SOLN
100.0000 mL | Freq: Once | INTRAMUSCULAR | Status: AC | PRN
  Administered 2018-09-05: 03:00:00 100 mL via INTRAVENOUS

## 2018-09-05 MED ORDER — ROSUVASTATIN CALCIUM 10 MG PO TABS
20.0000 mg | ORAL_TABLET | Freq: Every day | ORAL | Status: DC
  Administered 2018-09-05 – 2018-09-09 (×5): 20 mg via ORAL
  Filled 2018-09-05 (×5): qty 2

## 2018-09-05 MED ORDER — ZOLPIDEM TARTRATE 5 MG PO TABS
5.0000 mg | ORAL_TABLET | Freq: Every evening | ORAL | Status: DC | PRN
  Administered 2018-09-08: 5 mg via ORAL
  Filled 2018-09-05 (×2): qty 1

## 2018-09-05 MED ORDER — SODIUM CHLORIDE 0.9 % IV SOLN
1.0000 g | INTRAVENOUS | Status: DC
  Administered 2018-09-05 – 2018-09-06 (×2): 1 g via INTRAVENOUS
  Filled 2018-09-05 (×2): qty 10

## 2018-09-05 MED ORDER — OXYCODONE HCL 5 MG PO TABS
5.0000 mg | ORAL_TABLET | ORAL | Status: DC | PRN
  Administered 2018-09-05 – 2018-09-09 (×11): 5 mg via ORAL
  Filled 2018-09-05 (×11): qty 1

## 2018-09-05 MED ORDER — HYDROCODONE-ACETAMINOPHEN 7.5-325 MG PO TABS
1.0000 | ORAL_TABLET | ORAL | Status: DC | PRN
  Administered 2018-09-05: 1 via ORAL
  Filled 2018-09-05: qty 1

## 2018-09-05 MED ORDER — ACETAMINOPHEN 325 MG PO TABS
650.0000 mg | ORAL_TABLET | Freq: Four times a day (QID) | ORAL | Status: DC | PRN
  Administered 2018-09-05 – 2018-09-08 (×5): 650 mg via ORAL
  Filled 2018-09-05 (×4): qty 2

## 2018-09-05 MED ORDER — VANCOMYCIN HCL 10 G IV SOLR
2500.0000 mg | Freq: Once | INTRAVENOUS | Status: AC
  Administered 2018-09-05: 01:00:00 2500 mg via INTRAVENOUS
  Filled 2018-09-05: qty 2500

## 2018-09-05 MED ORDER — VANCOMYCIN HCL IN DEXTROSE 1-5 GM/200ML-% IV SOLN
1000.0000 mg | Freq: Two times a day (BID) | INTRAVENOUS | Status: DC
  Filled 2018-09-05 (×2): qty 200

## 2018-09-05 MED ORDER — CARVEDILOL 6.25 MG PO TABS: 6.2500 mg | ORAL_TABLET | Freq: Once | ORAL | Status: DC

## 2018-09-05 MED ORDER — HEPARIN SODIUM (PORCINE) 5000 UNIT/ML IJ SOLN
5000.0000 [IU] | Freq: Three times a day (TID) | INTRAMUSCULAR | Status: DC
  Administered 2018-09-05 – 2018-09-10 (×15): 5000 [IU] via SUBCUTANEOUS
  Filled 2018-09-05 (×15): qty 1

## 2018-09-05 MED ORDER — KETOROLAC TROMETHAMINE 15 MG/ML IJ SOLN
15.0000 mg | Freq: Once | INTRAMUSCULAR | Status: AC
  Administered 2018-09-05: 15 mg via INTRAVENOUS
  Filled 2018-09-05: qty 1

## 2018-09-05 MED ORDER — DOCUSATE SODIUM 100 MG PO CAPS
100.0000 mg | ORAL_CAPSULE | Freq: Two times a day (BID) | ORAL | Status: DC
  Administered 2018-09-05 – 2018-09-09 (×9): 100 mg via ORAL
  Filled 2018-09-05 (×10): qty 1

## 2018-09-05 NOTE — H&P (Signed)
Corey James is an 63 y.o. male.   Chief Complaint: Fever HPI: The patient with past medical history of recurrent cellulitis as well as hepatitis C presents to the emergency department complaining of subjective fever.  Indeed upon arrival the patient's temperature was 101.5 F and he was found to meet criteria for sepsis.  The patient admits to sweats but denies cough, shortness of breath nausea or vomiting.  He also denies shortness of breath.  Also, no travel risk factors or known contact with individuals with exposure to novel coronavirus.  Sepsis protocol was initiated prior to the emergency department staff on the hospitalist service for admission.   Past Medical History:  Diagnosis Date  . Back pain   . Cellulitis   . Disc degeneration, lumbosacral   . Hepatitis C     Past Surgical History:  Procedure Laterality Date  . APPLICATION OF WOUND VAC Left 07/14/2018   Procedure: WOUND VAC CHANGE;  Surgeon: Olean Ree, MD;  Location: [facility] ORS;  Service: General;  Laterality: Left;  . INCISION AND DRAINAGE ABSCESS Left 07/11/2018   Procedure: INCISION AND DRAINAGE ABSCESS-LEFT THIGH;  Surgeon: [name], MD;  Location: [facility] ORS;  Service: General;  Laterality: Left;  . WOUND DEBRIDEMENT Left 07/14/2018   Procedure: POSSIBLE DEBRIDEMENT WOUND-LEFT THIGH;  Surgeon: Olean Ree, MD;  Location: [facility] ORS;  Service: General;  Laterality: Left;    Family History  Problem Relation Age of Onset  . Dementia Mother   . Alzheimer's disease Mother   . CVA Father   . Cancer - Colon Father   . Heart attack Brother   . Hypertension Brother    Social History:  reports that he has been smoking cigarettes. He has a 40.00 pack-year smoking history. He uses smokeless tobacco. He reports current alcohol use. He reports current drug use. Drug: Marijuana.  Allergies:  Allergies  Allergen Reactions  . Penicillins Itching and Rash    Medications Prior to Admission  Medication Sig Dispense Refill   . acetaminophen (TYLENOL) 500 MG tablet Take 1 tablet (500 mg total) by mouth every 6 (six) hours as needed for mild pain or fever. 30 tablet 0  . buPROPion (WELLBUTRIN SR) 150 MG 12 hr tablet Take 1 tablet once daily x3 days, then take 1 tablet twice daily. 180 tablet 1  . cyclobenzaprine (FLEXERIL) 10 MG tablet PLEASE SEE ATTACHED FOR DETAILED DIRECTIONS    . docusate sodium (COLACE) 100 MG capsule Take 100 mg by mouth 2 (two) times daily.    [name] HYDROcodone-acetaminophen (NORCO) 7.5-325 MG tablet Take 1 tablet by mouth every 4 (four) hours as needed.     . rosuvastatin (CRESTOR) 20 MG tablet Take 1 tablet (20 mg total) by mouth daily. 90 tablet 3  . sulfamethoxazole-trimethoprim (BACTRIM DS,SEPTRA DS) 800-160 MG tablet Take 1 tablet by mouth 2 (two) times daily. 28 tablet 0    Results for orders placed or performed during the hospital encounter of 09/04/18 (from the past 48 hour(s))  Basic metabolic panel     Status: Abnormal   Collection Time: 09/04/18 10:47 PM  Result Value Ref Range   Sodium 131 (L) 135 - 145 mmol/L   Potassium 4.0 3.5 - 5.1 mmol/L   Chloride 100 98 - 111 mmol/L   CO2 23 22 - 32 mmol/L   Glucose, Bld 214 (H) 70 - 99 mg/dL   BUN 22 8 - 23 mg/dL   Creatinine, Ser 1.27 (H) 0.61 - 1.24 mg/dL   Calcium 8.4 (  L) 8.9 - 10.3 mg/dL   GFR calc non Af Amer >60 >60 mL/min   GFR calc Af Amer >60 >60 mL/min   Anion gap 8 5 - 15    Comment: Performed at [facility], [address] [place], [place] [address]  CBC with Differential/Platelet     Status: Abnormal   Collection Time: 09/04/18 10:47 PM  Result Value Ref Range   WBC 13.5 (H) 4.0 - 10.5 K/uL   RBC 4.47 4.22 - 5.81 MIL/uL   Hemoglobin 14.4 13.0 - 17.0 g/dL   HCT 42.6 39.0 - 52.0 %   MCV 95.3 80.0 - 100.0 fL   MCH 32.2 26.0 - 34.0 pg   MCHC 33.8 30.0 - 36.0 g/dL   RDW 12.6 11.5 - 15.5 %   Platelets 122 (L) 150 - 400 K/uL    Comment: Immature Platelet Fraction may be clinically indicated,  consider ordering this additional test [id number]    nRBC 0.0 0.0 - 0.2 %   Neutrophils Relative % 94 %   Neutro Abs 12.7 (H) 1.7 - 7.7 K/uL   Lymphocytes Relative 3 %   Lymphs Abs 0.4 (L) 0.7 - 4.0 K/uL   Monocytes Relative 1 %   Monocytes Absolute 0.2 0.1 - 1.0 K/uL   Eosinophils Relative 0 %   Eosinophils Absolute 0.0 0.0 - 0.5 K/uL   Basophils Relative 0 %   Basophils Absolute 0.0 0.0 - 0.1 K/uL   WBC Morphology TOXIC GRANULATION    Immature Granulocytes 2 %   Abs Immature Granulocytes 0.24 (H) 0.00 - 0.07 K/uL    Comment: Performed at [facility], [address] [place], [place] [address]  Lactic acid, plasma     Status: None   Collection Time: 09/04/18 10:48 PM  Result Value Ref Range   Lactic Acid, Venous 1.4 0.5 - 1.9 mmol/L    Comment: Performed at [facility], [address] [place], [place] [address]  Ethanol     Status: None   Collection Time: 09/04/18 10:48 PM  Result Value Ref Range   Alcohol, Ethyl (B) <10 <10 mg/dL    Comment: (NOTE) Lowest detectable limit for serum alcohol is 10 mg/dL. For medical purposes only. Performed at [facility], [address] [place], [place] [address]   Urinalysis, Complete w Microscopic     Status: Abnormal   Collection Time: 09/04/18 11:24 PM  Result Value Ref Range   Color, Urine YELLOW (A) YELLOW   APPearance CLEAR (A) CLEAR   Specific Gravity, Urine 1.029 1.005 - 1.030   pH 5.0 5.0 - 8.0   Glucose, UA >=500 (A) NEGATIVE mg/dL   Hgb urine dipstick NEGATIVE NEGATIVE   Bilirubin Urine NEGATIVE NEGATIVE   Ketones, ur NEGATIVE NEGATIVE mg/dL   Protein, ur 30 (A) NEGATIVE mg/dL   Nitrite NEGATIVE NEGATIVE   Leukocytes,Ua NEGATIVE NEGATIVE   RBC / HPF 0-5 0 - 5 RBC/hpf   WBC, UA 0-5 0 - 5 WBC/hpf   Bacteria, UA RARE (A) NONE SEEN   Squamous Epithelial / LPF NONE SEEN 0 - 5   Mucus PRESENT     Comment: Performed at [facility], [address] [place], [place] [address]  Urine  Drug Screen, Qualitative     Status: Abnormal   Collection Time: 09/04/18 11:24 PM  Result Value Ref Range   Tricyclic, Ur Screen NONE DETECTED NONE DETECTED   Amphetamines, Ur Screen NONE DETECTED NONE DETECTED   MDMA (Ecstasy)Ur Screen  NONE DETECTED NONE DETECTED   Cocaine Metabolite,Ur [place] NONE DETECTED NONE DETECTED   Opiate, Ur Screen POSITIVE (A) NONE DETECTED   Phencyclidine (PCP) Ur S NONE DETECTED NONE DETECTED   Cannabinoid 50 Ng, Ur [place] NONE DETECTED NONE DETECTED   Barbiturates, Ur Screen NONE DETECTED NONE DETECTED   Benzodiazepine, Ur Scrn NONE DETECTED NONE DETECTED   Methadone Scn, Ur NONE DETECTED NONE DETECTED    Comment: (NOTE) Tricyclics + metabolites, urine    Cutoff 1000 ng/mL Amphetamines + metabolites, urine  Cutoff 1000 ng/mL MDMA (Ecstasy), urine              Cutoff 500 ng/mL Cocaine Metabolite, urine          Cutoff 300 ng/mL Opiate + metabolites, urine        Cutoff 300 ng/mL Phencyclidine (PCP), urine         Cutoff 25 ng/mL Cannabinoid, urine                 Cutoff 50 ng/mL Barbiturates + metabolites, urine  Cutoff 200 ng/mL Benzodiazepine, urine              Cutoff 200 ng/mL Methadone, urine                   Cutoff 300 ng/mL The urine drug screen provides only a preliminary, unconfirmed analytical test result and should not be used for non-medical purposes. Clinical consideration and professional judgment should be applied to any positive drug screen result due to possible interfering substances. A more specific alternate chemical method must be used in order to obtain a confirmed analytical result. Gas chromatography / mass spectrometry (GC/MS) is the preferred confirmat ory method. Performed at [facility], [address], [place], [place] [address]    Dg Chest [place] 1 View  Result Date: 09/04/2018 CLINICAL DATA:  63 y/o M; fever and cellulitis of the left lower extremity. EXAM: PORTABLE CHEST 1 VIEW COMPARISON:  07/06/2018 chest  radiograph FINDINGS: Cardiac silhouette within normal limits given projection and technique. No consolidation, effusion, or pneumothorax. Multilevel degenerative changes of the thoracic spine. No acute osseous abnormality is evident. IMPRESSION: No active disease. Electronically Signed   By: Kristine Garbe M.D.   On: 09/04/2018 22:51   Dg Femur Min 2 Views Left  Result Date: 09/04/2018 CLINICAL DATA:  63 y/o M; fever and cellulitis of left lower extremity. EXAM: LEFT FEMUR 2 VIEWS COMPARISON:  None. FINDINGS: There is no evidence of fracture or other focal bone lesions. Narrowing of the left knee joint medial femorotibial compartment. Left hip joint is well maintained. IMPRESSION: No acute bony or articular abnormality. Electronically Signed   By: Kristine Garbe M.D.   On: 09/04/2018 22:53    Review of Systems  Constitutional: Positive for diaphoresis and fever. Negative for chills.  HENT: Negative for sore throat and tinnitus.   Eyes: Negative for blurred vision and redness.  Respiratory: Negative for cough and shortness of breath.   Cardiovascular: Negative for chest pain, palpitations, orthopnea and PND.  Gastrointestinal: Negative for abdominal pain, diarrhea, nausea and vomiting.  Genitourinary: Negative for dysuria, frequency and urgency.  Musculoskeletal: Negative for joint pain and myalgias.  Skin: Negative for rash.       No lesions  Neurological: Negative for speech change, focal weakness and weakness.  Endo/Heme/Allergies: Does not bruise/bleed easily.       No temperature intolerance  Psychiatric/Behavioral: Negative for depression and suicidal ideas.    Blood pressure 130/61,  pulse 88, temperature (!) 101.5 F (38.6 C), temperature source Oral, resp. rate 15, height 5\' 9"  (1.753 m), weight (!) 155 kg, SpO2 95 %. Physical Exam  Vitals reviewed. Constitutional: He is oriented to person, place, and time. He appears well-developed and well-nourished. No  distress.  HENT:  Head: Normocephalic and atraumatic.  Mouth/Throat: Oropharynx is clear and moist.  Xanthomas on upper eyelids bilaterally  Eyes: Pupils are equal, round, and reactive to light. Conjunctivae and EOM are normal. No scleral icterus.  Neck: Normal range of motion. Neck supple. No JVD present. No tracheal deviation present. No thyromegaly present.  Cardiovascular: Normal rate, regular rhythm and normal heart sounds. Exam reveals no gallop and no friction rub.  No murmur heard. Respiratory: Effort normal and breath sounds normal. No respiratory distress.  GI: Soft. Bowel sounds are normal. He exhibits no distension. There is no abdominal tenderness.  Genitourinary:    Genitourinary Comments: Deferred   Musculoskeletal: Normal range of motion.        General: No edema.  Lymphadenopathy:    He has no cervical adenopathy.  Neurological: He is alert and oriented to person, place, and time. No cranial nerve deficit.  Skin: Skin is warm and dry. No rash noted. No erythema.  Psychiatric: He has a normal mood and affect. His behavior is normal. Judgment and thought content normal.     Assessment/Plan This is a 63 year old male admitted for sepsis. 1.  Sepsis: The patient meets criteria via fever, leukocytosis, tachycardia and tachypnea.  He is hemodynamically stable.  Continue ceftriaxone and vancomycin.  Follow blood cultures for growth and sensitivities. 2.  Cellulitis: Left lower extremity.  History of previous abscess same leg.  CT scan shows nonspecific fluid collections but no definite abscess, subcutaneous emphysema or osteomyelitis. 3.  Metabolic syndrome: Hemoglobin A1c 6 nearly 2 months ago.  His blood sugars are elevated presently.  I have placed him on sliding scale insulin for now. 4.  Morbid obesity: BMI is 50.4; encouraged healthy diet and exercise 5.  Hyperlipidemia: Continue statin therapy 6.  Depression: Able; continue Wellbutrin 7.  DVT prophylaxis: Heparin 8.   GI prophylaxis: None The patient is a full code.  Time spent on admission orders and patient care approximately 45 minutes  Harrie Foreman, MD 09/05/2018, 1:08 AM

## 2018-09-05 NOTE — Progress Notes (Signed)
Patient admitted early this morning.  Seen and examined by me later in the morning.  States he is feeling better this morning.  He is still having pretty significant pain of his left thigh wound.  Has been off antibiotics for the last month.  On exam, he has some erythema and induration of the left medial thigh.  No drainage or purulence from the surgical incision.  -CT left thigh without any fluid collections -Continue vancomycin and Rocephin -Surgery following -Left lower extremity venous ultrasound ordered to rule out DVT -Pain control  Hyman Bible, MD

## 2018-09-05 NOTE — Progress Notes (Signed)
Pt BP and HR elevated. Slight temp of 99.2. Pt has PRN orders for labetolol IV. I offered pt BP meds, initially he refused and the complied, then refused again after learning about the BP checks. Pt does not want BP rechecked anymore and states he just wants to sleep. So no labetolol was given. MD notified. PRN oxy given for pain along with tylenol for pt slight temp per pt request.

## 2018-09-05 NOTE — TOC Initial Note (Signed)
Transition of Care [facility]) - Initial/Assessment Note    Patient Details  Name: Corey James MRN: [id number] Date of Birth: 1956/04/09  Transition of [facility] CM/SW Contact:    Beverly Sessions, RN Phone Number: 09/05/2018, 1:57 PM  Clinical Narrative:                 Patient admitted from home with sepsis.  Assessment completed via phone due to restrictions on entering isolation rooms.    Patient provided very limited information during assessment.  Patient states "im only answering your questions if you just have a few. Im tired".  Patient is currently open with [facility] through [place].  Corene Cornea with [facility] notified of admission.  Patient states that he has a [employer] wound vac that was removed 4/13, and that it is still at home.  Patient states that he lives in a garage.  Patient declined to provided information about who he lives with, or the arrangements  Patient denies issues with transportation.  RNCM consulted for medication needs.  RNCM inquired why patient stopped antibiotics at home.  Patient states "because I was told I could".  Patient states that he was able to afford all of his medications at discharge "my aunt paid for them".  He sates "it didn't have nothing to do with me not being able to pay for them, they told me to stop".  Patient states that his PCP is [name] and that he is currently  Prior to [facility] being able to disccuss any medication assistance resources patient states "im done answering questions now", and disconnected the phone call     Expected Discharge Plan: [place] Barriers to Discharge: Continued Medical Work up   Patient Goals and CMS Choice        Expected Discharge Plan and Services Expected Discharge Plan: [place]   Discharge Planning Services: CM Consult, Medication Assistance   Living arrangements for the past 2 months: (Garage at a "persons" house) Expected Discharge  Date: 09/09/18                   HH Arranged: RN [place] Agency: [place] ([place])  Prior Living Arrangements/Services Living arrangements for the past 2 months: (Garage at a "persons" house) Lives with:: Other (Comment)(would not disclose)              Current home services: Home RN    Activities of Daily Living Home Assistive Devices/Equipment: Environmental consultant (specify type), Wheelchair(front wheels) ADL Screening (condition at time of admission) Patient's cognitive ability adequate to safely complete daily activities?: Yes Is the patient deaf or have difficulty hearing?: No Does the patient have difficulty seeing, even when wearing glasses/contacts?: No Does the patient have difficulty concentrating, remembering, or making decisions?: No Patient able to express need for assistance with ADLs?: Yes Does the patient have difficulty dressing or bathing?: No Independently performs ADLs?: Yes (appropriate for developmental age) Does the patient have difficulty walking or climbing stairs?: Yes Weakness of Legs: Both Weakness of Arms/Hands: None  Permission Sought/Granted                  Emotional Assessment   Attitude/Demeanor/Rapport: Guarded Affect (typically observed): Blunt        Admission diagnosis:  Fever [R50.9] Cellulitis of left lower extremity [L03.116] Patient Active Problem List   Diagnosis Date Noted  . Sepsis ([place]) 09/05/2018  . Chronic bilateral low back pain with right-sided  sciatica 08/31/2018  . Class 3 severe obesity due to excess calories with serious comorbidity and body mass index (BMI) of 50.0 to 59.9 in adult ([place]) 08/31/2018  . Nocturia 08/31/2018  . Necrotizing soft tissue infection   . Abscess of left lower extremity   . Thigh abscess 07/06/2018  . Arthritis of hip 06/09/2018  . DDD (degenerative disc disease), lumbar 06/09/2018  . Morbid obesity with BMI of 45.0-49.9, adult ([place]) 06/09/2018   PCP:  [name], FNP Pharmacy:    [id number] - [place], [place] [address] Phone: [phone] Fax: [phone]     Social Determinants of Health (SDOH) Interventions    Readmission Risk Interventions No flowsheet data found.

## 2018-09-05 NOTE — Progress Notes (Addendum)
Patient's BP is severely elevated.  HR is up too.  IV labetalol ordered.  Pt refused due to not wanting to have his BP vitals rechecked in 15 min per protocol.  Pt agreed to take carvedilol, PO.  Small dose ordered as Pt is not allowing frequent enough vital checks.  Pt is progressively agitated with nursing and refusing treatment for his hypertension and tachycardia.  [facility] 09/05/2018, 9:20 PM  Note:  This document was prepared using Dragon voice recognition software and may include unintentional dictation errors.

## 2018-09-05 NOTE — ED Provider Notes (Signed)
Discussed case with Dr. Kirke Corin roughly an hour ago.  He acknowledges consultation and has reviewed notes from the clinic from Dr. Dahlia Byes as well.   Delman Kitten, MD 09/05/18 0127

## 2018-09-05 NOTE — Consult Note (Signed)
Pharmacy Antibiotic Note  Corey James is a 63 y.o. male admitted on 09/04/2018 with cellulitis.  Pharmacy has been consulted for Vancomycin dosing.  Plan: Vancomycin 2500mg  IV x 1 dose, followed by Vancomycin 1000 mg IV Q 12 hrs. Goal AUC 400-550. Expected AUC: 468 SCr used: 1.27  Pharmacy will monitor renal function and adjust dose as needed.    Height: 5\' 9"  (175.3 cm) Weight: (!) 341 lb 11.4 oz (155 kg) IBW/kg (Calculated) : 70.7  Temp (24hrs), Avg:100 F (37.8 C), Min:98.4 F (36.9 C), Max:101.5 F (38.6 C)  Recent Labs  Lab 08/31/18 0958 09/04/18 2247 09/04/18 2248  WBC 5.5 13.5*  --   CREATININE 1.00 1.27*  --   LATICACIDVEN  --   --  1.4    Estimated Creatinine Clearance: 90.2 mL/min (A) (by C-G formula based on SCr of 1.27 mg/dL (H)).    Allergies  Allergen Reactions  . Penicillins Itching and Rash    Antimicrobials this admission: 4/13 Ceftriaxone >>  4/13 Vancomycin  >>   Dose adjustments this admission:   Microbiology results: 4/13 BCx: pending   Thank you for allowing pharmacy to be a part of this patient's care.  Pernell Dupre, PharmD, BCPS Clinical Pharmacist 09/05/2018 1:06 AM

## 2018-09-05 NOTE — Progress Notes (Signed)
Patient had taken off telemetry before going to have CT completed. Patient refused to have it put back on. Nurse explained that patient had come in due to sepsis and cellulitis and the importance of making sure his heart was not having any arrythmias. Patient continued to refuse. Nurse spoke with Dr Sidney Ace to inform him of patient's refusal and that he continued to decline to wear it against medical advice.

## 2018-09-05 NOTE — ED Notes (Signed)
ED TO INPATIENT HANDOFF REPORT  ED Nurse Name and Phone #:Aizlyn Schifano RN [phone]   S Name/Age/Gender Corey James 63 y.o. male Room/Bed: ED17A/ED17A  Code Status   Code Status: Prior  Home/SNF/Other Home Patient oriented to: self, place, time and situation Is this baseline? Yes   Triage Complete: Triage complete  Chief Complaint Leg Swelling/Fever  Triage Note Pt arrived to the ED via POV from home for complaints of "cellulitis" on the left leg. Pt reports that he has the same symptoms about 2 weeks ago and got skin removed. Today he called the surgeon and he told him to come to the hospital to receive IV antibiotics. Pt is AOx4 in no apparent distress.    Allergies Allergies  Allergen Reactions  . Penicillins Itching and Rash    Level of Care/Admitting Diagnosis ED Disposition    ED Disposition Condition [facility] Area: [place] [[id number]]  Level of Care: Med-Surg [16]  Diagnosis: Sepsis [facility]) [[id number]]  Admitting Physician: Harrie Foreman [[id number]]  Attending Physician: Harrie Foreman [[id number]]  Estimated length of stay: past midnight tomorrow  Certification:: I certify this patient will need inpatient services for at least 2 midnights  PT Class (Do Not Modify): Inpatient [101]  PT Acc Code (Do Not Modify): Private [1]       B Medical/Surgery History Past Medical History:  Diagnosis Date  . Back pain   . Cellulitis   . Disc degeneration, lumbosacral   . Hepatitis C    Past Surgical History:  Procedure Laterality Date  . APPLICATION OF WOUND VAC Left 07/14/2018   Procedure: WOUND VAC CHANGE;  Surgeon: Olean Ree, MD;  Location: [facility] ORS;  Service: General;  Laterality: Left;  . INCISION AND DRAINAGE ABSCESS Left 07/11/2018   Procedure: INCISION AND DRAINAGE ABSCESS-LEFT THIGH;  Surgeon: [name], MD;  Location: [facility] ORS;  Service: General;  Laterality: Left;  . WOUND DEBRIDEMENT Left 07/14/2018    Procedure: POSSIBLE DEBRIDEMENT WOUND-LEFT THIGH;  Surgeon: Olean Ree, MD;  Location: [facility] ORS;  Service: General;  Laterality: Left;     A IV Location/Drains/Wounds Patient Lines/Drains/Airways Status   Active Line/Drains/Airways    Name:   Placement date:   Placement time:   Site:   Days:   Peripheral IV 09/04/18 Right Antecubital   09/04/18    2245    Antecubital   1   Negative Pressure Wound Therapy Thigh Left   07/11/18    1518    -   56   Incision (Closed) 07/11/18 Leg Left   07/11/18    1443     56   Incision (Closed) 07/14/18 Leg Left   07/14/18    1039     53          Intake/Output Last 24 hours  Intake/Output Summary (Last 24 hours) at 09/05/2018 0114 Last data filed at 09/05/2018 0049 Gross per 24 hour  Intake 1100 ml  Output -  Net 1100 ml    Labs/Imaging Results for orders placed or performed during the hospital encounter of 09/04/18 (from the past 48 hour(s))  Basic metabolic panel     Status: Abnormal   Collection Time: 09/04/18 10:47 PM  Result Value Ref Range   Sodium 131 (L) 135 - 145 mmol/L   Potassium 4.0 3.5 - 5.1 mmol/L   Chloride 100 98 - 111 mmol/L   CO2 23 22 - 32 mmol/L   Glucose, Bld 214 (H) 70 -  99 mg/dL   BUN 22 8 - 23 mg/dL   Creatinine, Ser 1.27 (H) 0.61 - 1.24 mg/dL   Calcium 8.4 (L) 8.9 - 10.3 mg/dL   GFR calc non Af Amer >60 >60 mL/min   GFR calc Af Amer >60 >60 mL/min   Anion gap 8 5 - 15    Comment: Performed at [facility], [address] [place], [place] [address]  CBC with Differential/Platelet     Status: Abnormal   Collection Time: 09/04/18 10:47 PM  Result Value Ref Range   WBC 13.5 (H) 4.0 - 10.5 K/uL   RBC 4.47 4.22 - 5.81 MIL/uL   Hemoglobin 14.4 13.0 - 17.0 g/dL   HCT 42.6 39.0 - 52.0 %   MCV 95.3 80.0 - 100.0 fL   MCH 32.2 26.0 - 34.0 pg   MCHC 33.8 30.0 - 36.0 g/dL   RDW 12.6 11.5 - 15.5 %   Platelets 122 (L) 150 - 400 K/uL    Comment: Immature Platelet Fraction may be clinically indicated,  consider ordering this additional test [id number]    nRBC 0.0 0.0 - 0.2 %   Neutrophils Relative % 94 %   Neutro Abs 12.7 (H) 1.7 - 7.7 K/uL   Lymphocytes Relative 3 %   Lymphs Abs 0.4 (L) 0.7 - 4.0 K/uL   Monocytes Relative 1 %   Monocytes Absolute 0.2 0.1 - 1.0 K/uL   Eosinophils Relative 0 %   Eosinophils Absolute 0.0 0.0 - 0.5 K/uL   Basophils Relative 0 %   Basophils Absolute 0.0 0.0 - 0.1 K/uL   WBC Morphology TOXIC GRANULATION    Immature Granulocytes 2 %   Abs Immature Granulocytes 0.24 (H) 0.00 - 0.07 K/uL    Comment: Performed at [facility], [address] [place], [place] [address]  Lactic acid, plasma     Status: None   Collection Time: 09/04/18 10:48 PM  Result Value Ref Range   Lactic Acid, Venous 1.4 0.5 - 1.9 mmol/L    Comment: Performed at [facility], [address] [place], [place] [address]  Ethanol     Status: None   Collection Time: 09/04/18 10:48 PM  Result Value Ref Range   Alcohol, Ethyl (B) <10 <10 mg/dL    Comment: (NOTE) Lowest detectable limit for serum alcohol is 10 mg/dL. For medical purposes only. Performed at [facility], [address] [place], [place] [address]   Urinalysis, Complete w Microscopic     Status: Abnormal   Collection Time: 09/04/18 11:24 PM  Result Value Ref Range   Color, Urine YELLOW (A) YELLOW   APPearance CLEAR (A) CLEAR   Specific Gravity, Urine 1.029 1.005 - 1.030   pH 5.0 5.0 - 8.0   Glucose, UA >=500 (A) NEGATIVE mg/dL   Hgb urine dipstick NEGATIVE NEGATIVE   Bilirubin Urine NEGATIVE NEGATIVE   Ketones, ur NEGATIVE NEGATIVE mg/dL   Protein, ur 30 (A) NEGATIVE mg/dL   Nitrite NEGATIVE NEGATIVE   Leukocytes,Ua NEGATIVE NEGATIVE   RBC / HPF 0-5 0 - 5 RBC/hpf   WBC, UA 0-5 0 - 5 WBC/hpf   Bacteria, UA RARE (A) NONE SEEN   Squamous Epithelial / LPF NONE SEEN 0 - 5   Mucus PRESENT     Comment: Performed at [facility], [address] [place],  [address]  Urine  Drug Screen, Qualitative     Status: Abnormal   Collection Time: 09/04/18 11:24 PM  Result Value Ref  Range   Tricyclic, Ur Screen NONE DETECTED NONE DETECTED   Amphetamines, Ur Screen NONE DETECTED NONE DETECTED   MDMA (Ecstasy)Ur Screen NONE DETECTED NONE DETECTED   Cocaine Metabolite,Ur [place] NONE DETECTED NONE DETECTED   Opiate, Ur Screen POSITIVE (A) NONE DETECTED   Phencyclidine (PCP) Ur S NONE DETECTED NONE DETECTED   Cannabinoid 50 Ng, Ur [place] NONE DETECTED NONE DETECTED   Barbiturates, Ur Screen NONE DETECTED NONE DETECTED   Benzodiazepine, Ur Scrn NONE DETECTED NONE DETECTED   Methadone Scn, Ur NONE DETECTED NONE DETECTED    Comment: (NOTE) Tricyclics + metabolites, urine    Cutoff 1000 ng/mL Amphetamines + metabolites, urine  Cutoff 1000 ng/mL MDMA (Ecstasy), urine              Cutoff 500 ng/mL Cocaine Metabolite, urine          Cutoff 300 ng/mL Opiate + metabolites, urine        Cutoff 300 ng/mL Phencyclidine (PCP), urine         Cutoff 25 ng/mL Cannabinoid, urine                 Cutoff 50 ng/mL Barbiturates + metabolites, urine  Cutoff 200 ng/mL Benzodiazepine, urine              Cutoff 200 ng/mL Methadone, urine                   Cutoff 300 ng/mL The urine drug screen provides only a preliminary, unconfirmed analytical test result and should not be used for non-medical purposes. Clinical consideration and professional judgment should be applied to any positive drug screen result due to possible interfering substances. A more specific alternate chemical method must be used in order to obtain a confirmed analytical result. Gas chromatography / mass spectrometry (GC/MS) is the preferred confirmat ory method. Performed at [facility], [address] [place], [place] [address]    Dg Chest [place] 1 View  Result Date: 09/04/2018 CLINICAL DATA:  63 y/o M; fever and cellulitis of the left lower extremity. EXAM: PORTABLE CHEST 1 VIEW COMPARISON:  07/06/2018 chest  radiograph FINDINGS: Cardiac silhouette within normal limits given projection and technique. No consolidation, effusion, or pneumothorax. Multilevel degenerative changes of the thoracic spine. No acute osseous abnormality is evident. IMPRESSION: No active disease. Electronically Signed   By: Kristine Garbe M.D.   On: 09/04/2018 22:51   Dg Femur Min 2 Views Left  Result Date: 09/04/2018 CLINICAL DATA:  63 y/o M; fever and cellulitis of left lower extremity. EXAM: LEFT FEMUR 2 VIEWS COMPARISON:  None. FINDINGS: There is no evidence of fracture or other focal bone lesions. Narrowing of the left knee joint medial femorotibial compartment. Left hip joint is well maintained. IMPRESSION: No acute bony or articular abnormality. Electronically Signed   By: Kristine Garbe M.D.   On: 09/04/2018 22:53    Pending Labs Unresulted Labs (From admission, onward)    Start     Ordered   09/04/18 2209  Wound or Superficial Culture  Once,   STAT    Question:  Patient immune status  Answer:  Normal   09/04/18 2209   09/04/18 2202  Lactic acid, plasma  Now then every 2 hours,   STAT     09/04/18 2202   09/04/18 2202  Culture, blood (routine x 2)  BLOOD CULTURE X 2,   STAT     09/04/18 2202   Signed and Held  TSH  Add-on,  R     Signed and Held          Vitals/Pain Today's Vitals   09/05/18 0015 09/05/18 0030 09/05/18 0045 09/05/18 0048  BP: 134/66 122/62 130/61   Pulse: 94 79 88   Resp: (!) 26 (!) 26 15   Temp:      TempSrc:      SpO2: 98% 92% 95%   Weight:      Height:      PainSc:    5     Isolation Precautions No active isolations  Medications Medications  vancomycin (VANCOCIN) 2,500 mg in sodium chloride 0.9 % 500 mL IVPB (2,500 mg Intravenous New Bag/Given 09/05/18 0053)  vancomycin (VANCOCIN) IVPB 1000 mg/200 mL premix (has no administration in time range)  acetaminophen (TYLENOL) tablet 650 mg (650 mg Oral Given 09/04/18 2333)  cefTRIAXone (ROCEPHIN) 1 g in sodium  chloride 0.9 % 100 mL IVPB (0 g Intravenous Stopped 09/05/18 0049)  sodium chloride 0.9 % bolus 1,000 mL (0 mLs Intravenous Stopped 09/05/18 0049)    Mobility manual wheelchair Low fall risk   Focused Assessments Cellulitis   R Recommendations: See Admitting Provider Note  Report given to:   Additional Notes:

## 2018-09-05 NOTE — ED Provider Notes (Signed)
Labs reviewed, notable leukocytosis.  Patient meets sepsis criteria.  Antibiotics initiated by Dr. Burlene Arnt and plan for admission once labs resulted.  Patient resting comfortably, understanding and agreeable with plan for admission.  Discussed with Dr. Virl Diamond of hospitalist services.    Delman Kitten, MD 09/05/18 [phone]

## 2018-09-05 NOTE — Progress Notes (Signed)
Pt is very rude and easily gets agitated when you ask him questions.

## 2018-09-05 NOTE — Plan of Care (Signed)
  Problem: Education: Goal: Knowledge of General Education information will improve Description Including pain rating scale, medication(s)/side effects and non-pharmacologic comfort measures Outcome: Progressing   

## 2018-09-05 NOTE — Consult Note (Signed)
[facility] SURGICAL CONSULTATION NOTE (initial) - cpt: 48546   HISTORY OF PRESENT ILLNESS (HPI):  63 y.o. male presented to [facility] ED on 04/13 for evaluation of left thigh cellulitis. The patient was admitted on 02/13 for cellulitis of the left thigh which required I&D and wound vac placement in the OR on 02/18 with Dr Dahlia Byes. Upon review of chart, he was having issues in the outpatient setting with keeping the wound vac on and did resort to wet-to-dry dressing changes., Additionally, had some social issues and issue with completing full course of ABx. He presented to the clinic yesterday for evaluation as he reported 24 hours of worsening erythema around his left thigh. He was found to have blanching erythema of the left thigh without any evidence of gross abscess or necrotizing infection. He was started on Bactrim and recommended presentation to the OR for additional work up, however he refused. Following the appointment he developed a fever at home and felt the pain and redness were worsening, so he decided to present to the ED. He denied any associated chills, cough, CP, SOB, or abdominal pain. He does not left lower extremity edema and calf pain today. Work up in the ED showed a leukocytosis of 13K and CT was without evidence of abscess.  Surgery is consulted by hospitalist physician Dr. Rosilyn Mings, MD in this context for evaluation and management of cellulitis.    PAST MEDICAL HISTORY (PMH):  Past Medical History:  Diagnosis Date   Back pain    Cellulitis    Disc degeneration, lumbosacral    Hepatitis C      PAST SURGICAL HISTORY ([place]):  Past Surgical History:  Procedure Laterality Date   APPLICATION OF WOUND VAC Left 07/14/2018   Procedure: WOUND VAC CHANGE;  Surgeon: Olean Ree, MD;  Location: [facility] ORS;  Service: General;  Laterality: Left;   INCISION AND DRAINAGE ABSCESS Left 07/11/2018   Procedure: INCISION AND DRAINAGE ABSCESS-LEFT THIGH;  Surgeon:  [name], MD;  Location: [facility] ORS;  Service: General;  Laterality: Left;   WOUND DEBRIDEMENT Left 07/14/2018   Procedure: POSSIBLE DEBRIDEMENT [place];  Surgeon: Olean Ree, MD;  Location: [facility] ORS;  Service: General;  Laterality: Left;     MEDICATIONS:  Prior to Admission medications   Medication Sig Start Date End Date Taking? Authorizing Provider  acetaminophen (TYLENOL) 500 MG tablet Take 1 tablet (500 mg total) by mouth every 6 (six) hours as needed for mild pain or fever. 07/15/18  Yes Gouru, Illene Silver, MD  buPROPion (WELLBUTRIN SR) 150 MG 12 hr tablet Take 1 tablet once daily x3 days, then take 1 tablet twice daily. 08/31/18  Yes [name], FNP  cyclobenzaprine (FLEXERIL) 10 MG tablet PLEASE SEE ATTACHED FOR DETAILED DIRECTIONS 08/18/18  Yes [provider]  docusate sodium (COLACE) 100 MG capsule Take 100 mg by mouth 2 (two) times daily.   Yes [provider]  HYDROcodone-acetaminophen (NORCO) 7.5-325 MG tablet Take 1 tablet by mouth every 4 (four) hours as needed.  08/01/18  Yes [provider]  rosuvastatin (CRESTOR) 20 MG tablet Take 1 tablet (20 mg total) by mouth daily. 09/01/18  Yes [name], FNP  sulfamethoxazole-trimethoprim (BACTRIM DS,SEPTRA DS) 800-160 MG tablet Take 1 tablet by mouth 2 (two) times daily. 09/04/18  Yes Pabon, Marjory Lies, MD     ALLERGIES:  Allergies  Allergen Reactions   Penicillins Itching and Rash     SOCIAL HISTORY:  Social History   Socioeconomic History  Marital status: Widowed    Spouse name: Not on file   Number of children: Not on file   Years of education: Not on file   Highest education level: High school graduate  Occupational History   Occupation: Risk manager: [employer]   Financial resource strain: Not hard at all   Food insecurity:    Worry: Never true    Inability: Never true   Transportation needs:    Medical: No    Non-medical: No    Tobacco Use   Smoking status: Current Every Day Smoker    Packs/day: 1.00    Years: 40.00    Pack years: 40.00    Types: Cigarettes   Smokeless tobacco: Current User   Tobacco comment: Wellbutrin  Substance and Sexual Activity   Alcohol use: Yes    Comment: 1/5 of vodka per month    Drug use: Yes    Types: Marijuana   Sexual activity: Yes    Partners: Female    Birth control/protection: None  Lifestyle   Physical activity:    Days per week: 2 days    Minutes per session: 10 min   Stress: Not at all  Relationships   Social connections:    Talks on phone: More than three times a week    Gets together: More than three times a week    Attends religious service: Never    Active member of club or organization: No    Attends meetings of clubs or organizations: Never    Relationship status: Widowed   Intimate partner violence:    Fear of current or ex partner: No    Emotionally abused: No    Physically abused: No    Forced sexual activity: No  Other Topics Concern   Not on file  Social History Narrative   Not on file     FAMILY HISTORY:  Family History  Problem Relation Age of Onset   Dementia Mother    Alzheimer's disease Mother    CVA Father    Cancer - Colon Father    Heart attack Brother    Hypertension Brother       REVIEW OF SYSTEMS:  Review of Systems  Constitutional: Positive for fever. Negative for chills.  HENT: Negative for congestion and sinus pain.   Respiratory: Negative for cough and shortness of breath.   Cardiovascular: Negative for chest pain and palpitations.  Gastrointestinal: Negative for abdominal pain, constipation, diarrhea, nausea and vomiting.  Genitourinary: Negative for dysuria and hematuria.  Musculoskeletal: Negative for joint pain and myalgias.  Skin:       + Erythema (Left Thigh)  Neurological: Negative for dizziness and headaches.  All other systems reviewed and are negative.   VITAL SIGNS:  Temp:  [97.7  F (36.5 C)-101.5 F (38.6 C)] 97.7 F (36.5 C) (04/14 0344) Pulse Rate:  [79-115] 84 (04/14 0344) Resp:  [10-28] 18 (04/14 0344) BP: (116-184)/(59-104) 139/72 (04/14 0344) SpO2:  [92 %-99 %] 95 % (04/14 0344) Weight:  [154.5 kg-155 kg] 155 kg (04/13 2140)     Height: 5\' 9"  (175.3 cm) Weight: (!) 155 kg BMI (Calculated): 50.44   INTAKE/OUTPUT:  This shift: No intake/output data recorded.  Last 2 shifts: @IOLAST2SHIFTS @   PHYSICAL EXAM:  Physical Exam Vitals signs and nursing note reviewed.  Constitutional:      General: He is not in acute distress.    Appearance: Normal appearance. He is obese. He  is not ill-appearing.  HENT:     Head: Normocephalic and atraumatic.  Eyes:     General: No scleral icterus.    Conjunctiva/sclera: Conjunctivae normal.     Pupils: Pupils are equal, round, and reactive to light.  Cardiovascular:     Rate and Rhythm: Normal rate and regular rhythm.     Pulses: Normal pulses.     Heart sounds: No murmur. No friction rub.  Pulmonary:     Effort: Pulmonary effort is normal.     Breath sounds: Normal breath sounds.  Genitourinary:    Comments: Deferred Musculoskeletal:        General: Tenderness (Left calf) present.     Left lower leg: Edema present.  Skin:    General: Skin is warm and dry.     Findings: Erythema present.       Neurological:     General: No focal deficit present.     Mental Status: He is alert and oriented to person, place, and time.  Psychiatric:        Mood and Affect: Mood normal.        Behavior: Behavior normal.       Labs:  CBC Latest Ref Rng & Units 09/05/2018 09/04/2018 08/31/2018  WBC 4.0 - 10.5 K/uL 13.5(H) 13.5(H) 5.5  Hemoglobin 13.0 - 17.0 g/dL 15.5 14.4 15.4  Hematocrit 39.0 - 52.0 % 46.7 42.6 44.1  Platelets 150 - 400 K/uL 124(L) 122(L) 167   CMP Latest Ref Rng & Units 09/04/2018 08/31/2018 07/13/2018  Glucose 70 - 99 mg/dL 214(H) 96 139(H)  BUN 8 - 23 mg/dL 22 15 20   Creatinine 0.61 - 1.24 mg/dL 1.27(H)  1.00 0.98  Sodium 135 - 145 mmol/L 131(L) 137 136  Potassium 3.5 - 5.1 mmol/L 4.0 4.4 4.4  Chloride 98 - 111 mmol/L 100 101 100  CO2 22 - 32 mmol/L 23 26 30   Calcium 8.9 - 10.3 mg/dL 8.4(L) 9.1 8.1(L)  Total Protein 6.1 - 8.1 g/dL - 7.2 -  Total Bilirubin 0.2 - 1.2 mg/dL - 0.6 -  Alkaline Phos 38 - 126 U/L - - -  AST 10 - 35 U/L - 42(H) -  ALT 9 - 46 U/L - 46 -     Imaging studies:   CT Left Femur (09/05/2018) personally reviewed and radiologist report reviewed:  IMPRESSION: Residual soft tissue changes related to the prior abscess. No demonstrable fluid collection is seen. The overall skin thickening has improved when compared with the prior study. Mild reactive lymph nodes are noted in the left inguinal region. No underlying bony abnormality is noted.   Assessment/Plan: (ICD-10's: L03.116) 63 y.o. male with leukocytosis, fever, and left thigh erythema attributable left thigh cellulitis without appreciable abscess currently, complicated by pertinent comorbidities including obesity and current tobacco abuse (smoking).   - Okay for diet   - Continue IV ABx (Rocephin + Vancomycin) + IVF  - No indication for emergent surgical intervention at this time. He understands that if he were to worsen or develop an abscess he may require surgery  - Will obtain LLE Venous US for DVT for LLE swelling and left calf tenderness  - Pain control prn  - Monitor cellulitis  - Trend leukocytosis; morning CBC   - Medical management of comorbidities per primary team  All of the above findings and recommendations were discussed with the patient, and all of patient's questions were answered to his expressed satisfaction.  Thank you for the opportunity to participate in this patient's  care.   -- Edison Simon, PA-C [facility] 09/05/2018, 10:05 AM [phone] M-F: 7am - 4pm

## 2018-09-06 DIAGNOSIS — B1920 Unspecified viral hepatitis C without hepatic coma: Secondary | ICD-10-CM

## 2018-09-06 DIAGNOSIS — Z880 Allergy status to penicillin: Secondary | ICD-10-CM

## 2018-09-06 DIAGNOSIS — L03116 Cellulitis of left lower limb: Secondary | ICD-10-CM

## 2018-09-06 DIAGNOSIS — L304 Erythema intertrigo: Secondary | ICD-10-CM

## 2018-09-06 DIAGNOSIS — Z6841 Body Mass Index (BMI) 40.0 and over, adult: Secondary | ICD-10-CM

## 2018-09-06 DIAGNOSIS — M5136 Other intervertebral disc degeneration, lumbar region: Secondary | ICD-10-CM

## 2018-09-06 DIAGNOSIS — Z872 Personal history of diseases of the skin and subcutaneous tissue: Secondary | ICD-10-CM

## 2018-09-06 DIAGNOSIS — M1611 Unilateral primary osteoarthritis, right hip: Secondary | ICD-10-CM

## 2018-09-06 DIAGNOSIS — B954 Other streptococcus as the cause of diseases classified elsewhere: Secondary | ICD-10-CM

## 2018-09-06 DIAGNOSIS — B9561 Methicillin susceptible Staphylococcus aureus infection as the cause of diseases classified elsewhere: Secondary | ICD-10-CM

## 2018-09-06 DIAGNOSIS — F17210 Nicotine dependence, cigarettes, uncomplicated: Secondary | ICD-10-CM

## 2018-09-06 LAB — CBC
HCT: 40.6 % (ref 39.0–52.0)
Hemoglobin: 13.6 g/dL (ref 13.0–17.0)
MCH: 31.9 pg (ref 26.0–34.0)
MCHC: 33.5 g/dL (ref 30.0–36.0)
MCV: 95.3 fL (ref 80.0–100.0)
Platelets: 114 10*3/uL — ABNORMAL LOW (ref 150–400)
RBC: 4.26 MIL/uL (ref 4.22–5.81)
RDW: 12.8 % (ref 11.5–15.5)
WBC: 10.1 10*3/uL (ref 4.0–10.5)
nRBC: 0 % (ref 0.0–0.2)

## 2018-09-06 LAB — BASIC METABOLIC PANEL
Anion gap: 9 (ref 5–15)
BUN: 20 mg/dL (ref 8–23)
CO2: 23 mmol/L (ref 22–32)
Calcium: 8.2 mg/dL — ABNORMAL LOW (ref 8.9–10.3)
Chloride: 101 mmol/L (ref 98–111)
Creatinine, Ser: 0.97 mg/dL (ref 0.61–1.24)
GFR calc Af Amer: >60 mL/min (ref >60)
GFR calc non Af Amer: >60 mL/min (ref >60)
Glucose, Bld: 151 mg/dL — ABNORMAL HIGH (ref 70–99)
Potassium: 4 mmol/L (ref 3.5–5.1)
Sodium: 133 mmol/L — ABNORMAL LOW (ref 135–145)

## 2018-09-06 MED ORDER — VANCOMYCIN HCL 10 G IV SOLR
1250.0000 mg | Freq: Two times a day (BID) | INTRAVENOUS | Status: DC
  Administered 2018-09-06: 1250 mg via INTRAVENOUS
  Filled 2018-09-06 (×3): qty 1250

## 2018-09-06 MED ORDER — DILTIAZEM HCL 25 MG/5ML IV SOLN
5.0000 mg | Freq: Once | INTRAVENOUS | Status: DC
  Filled 2018-09-06: qty 5

## 2018-09-06 MED ORDER — FLUCONAZOLE 100 MG PO TABS
200.0000 mg | ORAL_TABLET | Freq: Every day | ORAL | Status: DC
  Administered 2018-09-06 – 2018-09-09 (×4): 200 mg via ORAL
  Filled 2018-09-06 (×6): qty 2

## 2018-09-06 MED ORDER — LORATADINE 10 MG PO TABS
10.0000 mg | ORAL_TABLET | Freq: Every day | ORAL | Status: DC | PRN
  Administered 2018-09-06: 10 mg via ORAL
  Filled 2018-09-06: qty 1

## 2018-09-06 MED ORDER — SODIUM CHLORIDE 0.9 % IV SOLN
2.0000 g | INTRAVENOUS | Status: DC
  Administered 2018-09-06 – 2018-09-09 (×4): 2 g via INTRAVENOUS
  Filled 2018-09-06 (×3): qty 20
  Filled 2018-09-06 (×2): qty 2

## 2018-09-06 MED ORDER — SALINE SPRAY 0.65 % NA SOLN
1.0000 | NASAL | Status: DC | PRN
  Administered 2018-09-06: 1 via NASAL
  Filled 2018-09-06: qty 44

## 2018-09-06 MED ORDER — SODIUM CHLORIDE 0.9 % IV SOLN
2.0000 g | Freq: Three times a day (TID) | INTRAVENOUS | Status: DC
  Administered 2018-09-06: 2 g via INTRAVENOUS
  Filled 2018-09-06 (×5): qty 2

## 2018-09-06 MED ORDER — LORAZEPAM 2 MG/ML IJ SOLN
0.5000 mg | INTRAMUSCULAR | Status: DC | PRN
  Administered 2018-09-06 – 2018-09-09 (×6): 0.5 mg via INTRAVENOUS
  Filled 2018-09-06 (×6): qty 1

## 2018-09-06 MED ORDER — CLINDAMYCIN PHOSPHATE 900 MG/50ML IV SOLN
900.0000 mg | Freq: Three times a day (TID) | INTRAVENOUS | Status: DC
  Administered 2018-09-06 – 2018-09-10 (×11): 900 mg via INTRAVENOUS
  Filled 2018-09-06 (×14): qty 50

## 2018-09-06 MED ORDER — METOPROLOL TARTRATE 50 MG PO TABS
50.0000 mg | ORAL_TABLET | ORAL | Status: AC
  Administered 2018-09-06: 15:00:00 50 mg via ORAL
  Filled 2018-09-06: qty 1

## 2018-09-06 NOTE — Consult Note (Addendum)
Pharmacy Antibiotic Note  Corey James is a 63 y.o. male admitted on 09/04/2018 with cellulitis.  Pharmacy has been consulted for Vancomycin dosing.  Starting Plan: Vancomycin 2500mg  IV x 1 dose, followed by Vancomycin 1000 mg IV Q 12 hrs. Goal AUC 400-550. Expected AUC: 468 SCr used: 1.27  Adjusted Plan: Vancomycin 1250 mg IV Q 12 hrs. Goal AUC 400-550. Expected AUC: 450 SCr used: 0.97  ADDENDUM: Pharmacy has been consulted for Cefepime dosing :  Will dose Cefepime 2g IV q8hours for cellulitis (d/c Ceftriaxone)  Pharmacy will monitor renal function and adjust dose as needed.    Height: 5\' 9"  (175.3 cm) Weight: (!) 346 lb 9 oz (157.2 kg) IBW/kg (Calculated) : 70.7  Temp (24hrs), Avg:98.7 F (37.1 C), Min:97.3 F (36.3 C), Max:101.2 F (38.4 C)  Recent Labs  Lab 08/31/18 0958 09/04/18 2247 09/04/18 2248 09/05/18 0846 09/06/18 0505  WBC 5.5 13.5*  --  13.5* 10.1  CREATININE 1.00 1.27*  --   --  0.97  LATICACIDVEN  --   --  1.4  --   --     Estimated Creatinine Clearance: 119.1 mL/min (by C-G formula based on SCr of 0.97 mg/dL).    Allergies  Allergen Reactions  . Penicillins Itching and Rash    Antimicrobials this admission: 4/13 Ceftriaxone >> 4/15 4/13 Vancomycin  >>  4/15 Cefepime >>   Dose adjustments this admission: As seen above  Microbiology results: 4/13 BCx: pending   Thank you for allowing pharmacy to be a part of this patient's care.  Lu Duffel, PharmD, BCPS Clinical Pharmacist 09/06/2018 12:33 PM

## 2018-09-06 NOTE — Consult Note (Signed)
NAME: Corey James  DOB: 06-04-1955  MRN: [id number]  Date/Time: 09/06/2018 1:53 PM  REQUESTING PROVIDER Subjective:  REASON FOR CONSULT:  ? CC  painful , swollen red left leg of 5 days duration. Fever of 2 days  Corey James is a 63 y.o. male with a history of Hepatitis C was recently in [facility] 2/13-2/22/20  for a bad necrotizing soft tissue infection of the left groin and underwent surgery .  See pictures below        Cultures from surgical specimen was MSSa and streptococcus C.got IV ceftriaxone and clindamycin while in hospital and sent home on PO Omnicef and flagyl for 10 days. Not sure what his compliance to the antibiotic was . Was being followed by Dr.PAbon and was doing well with the wound getting much smaller. . Wound vac was accidentally  Off 09/02/18 and he came to the ED on 4/12  2am and was  HE presented  to the ED on  4/12 for social issues and left . On 4/13 he went to Dr.PAbon's office and was noted to have cellulitis of the left thigh and leg and was prescribed bactrim, but later asked him to go to the ED for IV antibiotics PT came to the ED that night Had a temp of 101.5. WBC 13.5, cr 1.27 - Blood culture and wound culture was sent. Started on vanco and ceftriaxone. CT scan of the leg showed Persistent soft tissue edema in the anteromedial left thigh but significantly improved when compared with the prior exam. No demonstrable fluid collection is noted at this time. Mild skin thickening is noted along the medial thigh as well. Mild reactive lymph nodes are noted in the left inguinal region. I am asked to see  The patient for the same today Past Medical History:  Diagnosis Date  . Back pain   . Cellulitis   . Disc degeneration, lumbosacral   . Hepatitis C     Past Surgical History:  Procedure Laterality Date  . APPLICATION OF WOUND VAC Left 07/14/2018   Procedure: WOUND VAC CHANGE;  Surgeon: Olean Ree, MD;  Location: [facility] ORS;  Service: General;  Laterality: Left;   . INCISION AND DRAINAGE ABSCESS Left 07/11/2018   Procedure: INCISION AND DRAINAGE ABSCESS-LEFT THIGH;  Surgeon: [name], MD;  Location: [facility] ORS;  Service: General;  Laterality: Left;  . WOUND DEBRIDEMENT Left 07/14/2018   Procedure: POSSIBLE DEBRIDEMENT WOUND-LEFT THIGH;  Surgeon: Olean Ree, MD;  Location: [facility] ORS;  Service: General;  Laterality: Left;    Social History   Socioeconomic History  . Marital status: Widowed    Spouse name: Not on file  . Number of children: Not on file  . Years of education: Not on file  . Highest education level: High school graduate  Occupational History  . Occupation: Risk manager: [employer]  Social Needs  . Financial resource strain: Not hard at all  . Food insecurity:    Worry: Never true    Inability: Never true  . Transportation needs:    Medical: No    Non-medical: No  Tobacco Use  . Smoking status: Current Every Day Smoker    Packs/day: 1.00    Years: 40.00    Pack years: 40.00    Types: Cigarettes  . Smokeless tobacco: Current User  . Tobacco comment: Wellbutrin  Substance and Sexual Activity  . Alcohol use: Yes    Comment: 1/5 of vodka per month   .  Drug use: Yes    Types: Marijuana  . Sexual activity: Yes    Partners: Female    Birth control/protection: None  Lifestyle  . Physical activity:    Days per week: 2 days    Minutes per session: 10 min  . Stress: Not at all  Relationships  . Social connections:    Talks on phone: More than three times a week    Gets together: More than three times a week    Attends religious service: Never    Active member of club or organization: No    Attends meetings of clubs or organizations: Never    Relationship status: Widowed  . Intimate partner violence:    Fear of current or ex partner: No    Emotionally abused: No    Physically abused: No    Forced sexual activity: No  Other Topics Concern  . Not on file  Social History Narrative  . Not on  file    Family History  Problem Relation Age of Onset  . Dementia Mother   . Alzheimer's disease Mother   . CVA Father   . Cancer - Colon Father   . Heart attack Brother   . Hypertension Brother    Allergies  Allergen Reactions  . Penicillins Itching and Rash   ? Current Facility-Administered Medications  Medication Dose Route Frequency Provider Last Rate Last Dose  . acetaminophen (TYLENOL) tablet 650 mg  650 mg Oral Q6H PRN Harrie Foreman, MD   650 mg at 09/06/18 3154   Or  . acetaminophen (TYLENOL) suppository 650 mg  650 mg Rectal Q6H PRN Harrie Foreman, MD      . buPROPion [facility]) 12 hr tablet 150 mg  150 mg Oral BID Harrie Foreman, MD   150 mg at 09/06/18 0931  . ceFEPIme (MAXIPIME) 2 g in sodium chloride 0.9 % 100 mL IVPB  2 g Intravenous Q8H Lu Duffel, RPH 200 mL/hr at 09/06/18 1339 2 g at 09/06/18 1339  . cyclobenzaprine (FLEXERIL) tablet 5 mg  5 mg Oral TID PRN Harrie Foreman, MD      . diltiazem (CARDIZEM) injection 5 mg  5 mg Intravenous Once Mansy, Jan A, MD      . docusate sodium (COLACE) capsule 100 mg  100 mg Oral BID Harrie Foreman, MD   100 mg at 09/06/18 0930  . heparin injection 5,000 Units  5,000 Units Subcutaneous Q8H Harrie Foreman, MD   5,000 Units at 09/06/18 1337  . labetalol (NORMODYNE,TRANDATE) injection 10 mg  10 mg Intravenous Q2H PRN Lance Coon, MD      . loratadine (CLARITIN) tablet 10 mg  10 mg Oral Daily PRN Mayo, Pete Pelt, MD   10 mg at 09/06/18 0086  . LORazepam (ATIVAN) injection 0.5 mg  0.5 mg Intravenous Q4H PRN Mansy, Jan A, MD   0.5 mg at 09/06/18 0706  . ondansetron (ZOFRAN) tablet 4 mg  4 mg Oral Q6H PRN Harrie Foreman, MD       Or  . ondansetron [facility]) injection 4 mg  4 mg Intravenous Q6H PRN Harrie Foreman, MD      . oxyCODONE (Oxy IR/ROXICODONE) immediate release tablet 5 mg  5 mg Oral Q4H PRN Mayo, Pete Pelt, MD   5 mg at 09/05/18 2036  . pneumococcal 23 valent vaccine  (PNU-IMMUNE) injection 0.5 mL  0.5 mL Intramuscular Tomorrow-1000 Harrie Foreman, MD      .  rosuvastatin (CRESTOR) tablet 20 mg  20 mg Oral Daily Harrie Foreman, MD   20 mg at 09/06/18 0931  . sodium chloride (OCEAN) 0.65 % nasal spray 1 spray  1 spray Each Nare PRN Mayo, Pete Pelt, MD      . vancomycin (VANCOCIN) 1,250 mg in sodium chloride 0.9 % 250 mL IVPB  1,250 mg Intravenous Q12H Lu Duffel, RPH 166.7 mL/hr at 09/06/18 1031 1,250 mg at 09/06/18 1031  . zolpidem (AMBIEN) tablet 5 mg  5 mg Oral QHS PRN Saundra Shelling, MD         Abtx:  Anti-infectives (From admission, onward)   Start     Dose/Rate Route Frequency Ordered Stop   09/06/18 1400  ceFEPIme (MAXIPIME) 2 g in sodium chloride 0.9 % 100 mL IVPB     2 g 200 mL/hr over 30 Minutes Intravenous Every 8 hours 09/06/18 1231     09/06/18 1000  vancomycin (VANCOCIN) IVPB 1000 mg/200 mL premix  Status:  Discontinued     1,000 mg 200 mL/hr over 60 Minutes Intravenous Every 12 hours 09/05/18 0104 09/06/18 0804   09/06/18 1000  vancomycin (VANCOCIN) 1,250 mg in sodium chloride 0.9 % 250 mL IVPB     1,250 mg 166.7 mL/hr over 90 Minutes Intravenous Every 12 hours 09/06/18 0804     09/06/18 0800  cefTRIAXone (ROCEPHIN) 1 g in sodium chloride 0.9 % 100 mL IVPB  Status:  Discontinued     1 g 200 mL/hr over 30 Minutes Intravenous Every 24 hours 09/05/18 0153 09/05/18 0156   09/05/18 1000  cefTRIAXone (ROCEPHIN) 1 g in sodium chloride 0.9 % 100 mL IVPB  Status:  Discontinued     1 g 200 mL/hr over 30 Minutes Intravenous Every 24 hours 09/05/18 0156 09/06/18 1231   09/05/18 0045  vancomycin (VANCOCIN) 2,500 mg in sodium chloride 0.9 % 500 mL IVPB     2,500 mg 250 mL/hr over 120 Minutes Intravenous  Once 09/05/18 0037 09/05/18 0307   09/04/18 2215  cefTRIAXone (ROCEPHIN) 1 g in sodium chloride 0.9 % 100 mL IVPB     1 g 200 mL/hr over 30 Minutes Intravenous  Once 09/04/18 2209 09/05/18 0049   09/04/18 2215  vancomycin (VANCOCIN)  injection 1 g  Status:  Discontinued     1 g Intravenous  Once 09/04/18 2209 09/04/18 2210   09/04/18 2215  vancomycin (VANCOCIN) IVPB 1000 mg/200 mL premix  Status:  Discontinued     1,000 mg 200 mL/hr over 60 Minutes Intravenous  Once 09/04/18 2210 09/05/18 0037      REVIEW OF SYSTEMS:  Const:fever,  chills, negative weight loss Eyes: negative diplopia or visual changes, negative eye pain ENT: negative coryza, negative sore throat Resp: negative cough, hemoptysis, dyspnea Cards: negative for chest pain, palpitations, has lower extremity edema GU: negative for frequency, dysuria and hematuria GI: Negative for abdominal pain, diarrhea, bleeding, constipation Skin: negative for rash and pruritus Heme: negative for easy bruising and gum/nose bleeding MS: back pain and rt hip pain chronic  Neurolo:negative for headaches, dizziness, vertigo, memory problems  Psych: negative for feelings of anxiety, depression  Endocrine: negative for thyroid, diabetes Allergy/Immunology- penicillin allergy Objective:  VITALS:  BP (!) 148/69 (BP Location: Right Arm)   Pulse 90   Temp 98.4 F (36.9 C) (Oral)   Resp (!) 24   Ht 5\' 9"  (1.753 m)   Wt (!) 157.2 kg   SpO2 97%   BMI 51.18 kg/m  PHYSICAL EXAM:  General: Alert, cooperative, no distress, appears stated age.morbidly obese  Head: Normocephalic, without obvious abnormality, atraumatic. Eyes: Conjunctivae clear, anicteric sclerae. Pupils are equal ENT Nares normal. No drainage or sinus tenderness. Lips, mucosa, and tongue normal. No Thrush Neck: Supple,  Lungs: b/l air entry Heart: s1s2. Abdomen: Soft,  distended. Bowel sounds normal. No masses Extremities: left lower leg and thigh has erythema with brawny dema- left inguinal area- the previos I/D wound is alsmost healed except for a 2X 1.5 cm area which has scab. Surrounding glazy erythema No crepitus No blistering        Skin: No rashes or lesions. Or bruising Lymph: Cervical,  supraclavicular normal. Neurologic: Grossly non-focal Pertinent Labs Lab Results CBC    Component Value Date/Time   WBC 10.1 09/06/2018 0505   RBC 4.26 09/06/2018 0505   HGB 13.6 09/06/2018 0505   HCT 40.6 09/06/2018 0505   PLT 114 (L) 09/06/2018 0505   MCV 95.3 09/06/2018 0505   MCH 31.9 09/06/2018 0505   MCHC 33.5 09/06/2018 0505   RDW 12.8 09/06/2018 0505   LYMPHSABS 0.4 (L) 09/04/2018 2247   MONOABS 0.2 09/04/2018 2247   EOSABS 0.0 09/04/2018 2247   BASOSABS 0.0 09/04/2018 2247    CMP Latest Ref Rng & Units 09/06/2018 09/04/2018 08/31/2018  Glucose 70 - 99 mg/dL 151(H) 214(H) 96  BUN 8 - 23 mg/dL 20 22 15   Creatinine 0.61 - 1.24 mg/dL 0.97 1.27(H) 1.00  Sodium 135 - 145 mmol/L 133(L) 131(L) 137  Potassium 3.5 - 5.1 mmol/L 4.0 4.0 4.4  Chloride 98 - 111 mmol/L 101 100 101  CO2 22 - 32 mmol/L 23 23 26   Calcium 8.9 - 10.3 mg/dL 8.2(L) 8.4(L) 9.1  Total Protein 6.1 - 8.1 g/dL - - 7.2  Total Bilirubin 0.2 - 1.2 mg/dL - - 0.6  Alkaline Phos 38 - 126 U/L - - -  AST 10 - 35 U/L - - 42(H)  ALT 9 - 46 U/L - - 46      Microbiology: Recent Results (from the past 240 hour(s))  Culture, blood (routine x 2)     Status: None (Preliminary result)   Collection Time: 09/04/18 10:47 PM  Result Value Ref Range Status   Specimen Description BLOOD RIGHT HAND  Final   Special Requests   Final    BOTTLES DRAWN AEROBIC AND ANAEROBIC Blood Culture adequate volume   Culture   Final    NO GROWTH 1 DAY Performed at [facility], [address], [place], [place] [address]    Report Status PENDING  Incomplete  Wound or Superficial Culture     Status: None (Preliminary result)   Collection Time: 09/04/18 10:48 PM  Result Value Ref Range Status   Specimen Description   Final    THIGH Performed at [facility], [address], [place], [place] [address]    Special Requests   Final    Normal Performed at [facility], [address] [place], [place] [address]     Gram Stain   Final    FEW WBC PRESENT, PREDOMINANTLY PMN RARE GRAM POSITIVE COCCI IN PAIRS    Culture   Final    CULTURE REINCUBATED FOR BETTER GROWTH Performed at [facility], [place] [address], [place], [place] [address]    Report Status PENDING  Incomplete  Culture, blood (routine x 2)     Status: None (Preliminary result)   Collection Time: 09/04/18 11:24 PM  Result Value Ref Range Status  Specimen Description BLOOD RIGHT ASSIST CONTROL  Final   Special Requests   Final    BOTTLES DRAWN AEROBIC AND ANAEROBIC Blood Culture adequate volume   Culture   Final    NO GROWTH 2 DAYS Performed at [facility], [address] [place], [place] [address]    Report Status PENDING  Incomplete    IMAGING RESULTS: CT scan of the left thigh reviewed personally I have personally reviewed the films ? CT scan of the leg showed Persistent soft tissue edema in the anteromedial left thigh but significantly improved when compared with the prior exam. No demonstrable fluid collection is noted at this time. Mild skin thickening is noted along the medial thigh as well. Mild reactive lymph nodes are noted in the left inguinal region.   Impression/Recommendation 63 yr male with morbid obesity, alcohol use, smoker who recently had a necrotizing soft tissue infection of the left thigh surgically debrided in Feb 2020 and had a wound vac till 09/02/18. Presents with redness, swelling and pain of the left leg and thigh  Left leg and thigh cellulitis with brawny edema, no blistering or crepitus currently to suggest nec fasc. Change current antibiotis to ceftriaxone and clindamycin to cover strep/ MRSA less likely.  Seen by surgery and they do not think any intervention is needed currently  Observe closely  for evolving abscess.   Previous left groin necrotizing soft tissue infection , has almost healed but the groin area has severe intertrigo, likey candida infection Will add fluconazole  200mg  QD  Lumbar DDD and rt hip OA  [facility] - followed by Dr.Wohl as OP  Morbid obesity predisposing to the cellulitis ? ___________________________________________________ Discussed with patient and his nurse in detail

## 2018-09-06 NOTE — Progress Notes (Addendum)
Patient seen and examined as described below with surgical PA-C, Ardell Isaacs.  Assessment/Plan: (ICD-10's: L37.116) 63 y.o. male with persistent and extended LLE erythema and Left groin crease excoriation, fever to 101.2 yesterday evening, resolved leukocytosis attributable left thigh cellulitis without appreciable abscess on CT imaging or exam, no LLE DVT on venous doppler yesterday, 2 months s/p debridement for necrotizing soft tissue infection and complicated by comorbidities includingobesity and chronic ongoing tobacco abuse (smoking).              - Continue IV ABx (Rocephin + Vancomycin) + IVF  - Consider adding antifungal coverage and ID consultation  - No indication for emergent surgical intervention at this time, will continue to monitor for sign of abscess development or necrotizing soft tissue infection, for which patient expresses understanding surgery would be indicated              - Medical management of comorbidities per primary team  I have personally reviewed the patient's chart, evaluated/examined the patient, proposed the recommended management, and discussed these recommendations with the patient to his expressed satisfaction as well as with patient's RN and medical physician.  Thank you for the opportunity to participate in this patient's care.  -- Marilynne Drivers Rosana Hoes, MD, [place]: [facility] - Partnering for exceptional care. Office: [facility] SURGICAL PROGRESS NOTE (cpt [phone])  Hospital Day(s): 1.   Post op day(s):  [name]   Interval History: Patient seen and examined, had issues with BP control and HR overnight. He was also febrile to 101.2 yesterday around 2200. This morning, he reports he feels terrible and that his left leg "weighs 100 lbs." No complaints of CP, SOB, abdominal pain, N/V. He is currently on IV Rocephin and Vancomycin.  Review of Systems:  Constitutional: denies fever, chills   Respiratory: denies any shortness of breath  Cardiovascular: denies chest pain or palpitations  Gastrointestinal: denies abdominal pain, N/V, or diarrhea/and bowel function as per interval history Integumentary: +LLE Cellulitis   Vital signs in last 24 hours: [min-max] current  Temp:  [97.3 F (36.3 C)-101.2 F (38.4 C)] 98.4 F (36.9 C) (04/15 0608) Pulse Rate:  [92-122] 121 (04/15 0608) Resp:  [20-24] 24 (04/15 0509) BP: (129-190)/(65-89) 148/69 (04/15 0509) SpO2:  [95 %-99 %] 95 % (04/15 0608) Weight:  [157.2 kg] 157.2 kg (04/15 0500)     Height: 5\' 9"  (175.3 cm) Weight: (!) 157.2 kg BMI (Calculated): 51.16   Intake/Output this shift:  No intake/output data recorded.   Physical Exam:  Constitutional: alert, cooperative and no distress  HENT: normocephalic without obvious abnormality  Eyes: PERRL, EOM's grossly intact and symmetric  Respiratory: breathing non-labored at rest  Integumentary: +tender to palpation Left proximal medial thigh to knee and Left anterior and posterior calf erythema, +Left groin crease excoriation without previously recommended dry gauze or towel, no appreciable crepitus or fluctuance, stable/unchanged LLE edema  Labs:  CBC Latest Ref Rng & Units 09/06/2018 09/05/2018 09/04/2018  WBC 4.0 - 10.5 K/uL 10.1 13.5(H) 13.5(H)  Hemoglobin 13.0 - 17.0 g/dL 13.6 15.5 14.4  Hematocrit 39.0 - 52.0 % 40.6 46.7 42.6  Platelets 150 - 400 K/uL 114(L) 124(L) 122(L)   CMP Latest Ref Rng & Units 09/06/2018 09/04/2018 08/31/2018  Glucose 70 - 99 mg/dL 151(H) 214(H) 96  BUN 8 - 23 mg/dL 20 22 15   Creatinine 0.61 - 1.24 mg/dL 0.97 1.27(H) 1.00  Sodium 135 - 145 mmol/L 133(L) 131(L) 137  Potassium 3.5 -  5.1 mmol/L 4.0 4.0 4.4  Chloride 98 - 111 mmol/L 101 100 101  CO2 22 - 32 mmol/L 23 23 26   Calcium 8.9 - 10.3 mg/dL 8.2(L) 8.4(L) 9.1  Total Protein 6.1 - 8.1 g/dL - - 7.2  Total Bilirubin 0.2 - 1.2 mg/dL - - 0.6  Alkaline Phos 38 - 126 U/L - - -  AST 10 - 35 U/L - - 42(H)   ALT 9 - 46 U/L - - 46   Imaging studies: No new pertinent imaging studies  Assessment/Plan: (ICD-10's: L03.116) 63 y.o. male with persistent and extended LLE erythema and Left groin crease excoriation, fever to 101.2 yesterday evening, resolved leukocytosis attributable left thigh cellulitis without appreciable abscess on CT imaging or exam, 2 months s/p debridement for necrotizing soft tissue infection and complicated by comorbidities includingobesity and chronic ongoing tobacco abuse (smoking).              - Continue IV ABx (Rocephin + Vancomycin) + IVF  - Consider adding antifungal coverage and ID consultation  - No indication for emergent surgical intervention at this time, will continue to monitor for sign of abscess development or necrotizing soft tissue infection, for which patient expresses understanding surgery would be indicated              - Medical management of comorbidities per primary team   All of the above findings and recommendations were discussed with the patient, and the medical team, and all of patient's questions were answered to his expressed satisfaction.  -- Edison Simon, PA-C [facility] 09/06/2018, 7:29 AM [phone] M-F: 7am - 4pm

## 2018-09-06 NOTE — Progress Notes (Signed)
Pt hr elevated. Md notified - orders for cardizem 5mg  IV once. Pt called me into his room saying he doesn't feel right. I attempted to get another full set of VS. Pt pulls off BP cuff and pulse ox mid check. Pt refuses BP check. Pt refuses telemetry monitor. MD notified of pt behavior. The pt seems anxious and agitated. Orders for ativan prn.

## 2018-09-06 NOTE — Progress Notes (Signed)
Pt. requested to have sodium chloride nasal spray be left at bedside. It seems to help with his breathing.

## 2018-09-06 NOTE — Progress Notes (Signed)
[facility] NAME: Corey James    MR#:  [id number]  DATE OF BIRTH:  07-17-1955  SUBJECTIVE:  Patient reports that the redness and swelling have increased.  His leg is very painful.  He is very worried.  REVIEW OF SYSTEMS:    Review of Systems  Constitutional: Negative for fever, chills weight loss HENT: Negative for ear pain, nosebleeds, congestion, facial swelling, rhinorrhea, neck pain, neck stiffness and ear discharge.   Respiratory: Negative for cough, shortness of breath, wheezing  Cardiovascular: Negative for chest pain, palpitations and leg swelling.  Gastrointestinal: Negative for heartburn, abdominal pain, vomiting, diarrhea or consitpation Genitourinary: Negative for dysuria, urgency, frequency, hematuria Musculoskeletal: Negative for back pain or joint pain Neurological: Negative for dizziness, seizures, syncope, focal weakness,  numbness and headaches.  Hematological: Does not bruise/bleed easily.  Psychiatric/Behavioral: Negative for hallucinations, confusion, dysphoric mood  Skin: Left leg redness and marked edema with pain  Tolerating Diet: yes      DRUG ALLERGIES:   Allergies  Allergen Reactions  . Penicillins Itching and Rash    VITALS:  Blood pressure (!) 148/69, pulse 90, temperature 98.4 F (36.9 C), temperature source Oral, resp. rate (!) 24, height 5\' 9"  (1.753 m), weight (!) 157.2 kg, SpO2 97 %.  PHYSICAL EXAMINATION:  Constitutional: Appears obese. No distress. HENT: Normocephalic. [name] Oropharynx is clear and moist.  Eyes: Conjunctivae and EOM are normal. PERRLA, no scleral icterus.  Neck: Normal ROM. Neck supple. No JVD. No tracheal deviation. CVS: RRR, S1/S2 +, no murmurs, no gallops, no carotid bruit.  Pulmonary: Effort and breath sounds normal, no stridor, rhonchi, wheezes, rales.  Abdominal: Soft. BS +,  no distension, tenderness, rebound or guarding.  Musculoskeletal: Normal range of  motion. No edema and no tenderness.  Neuro: Alert. CN 2-12 grossly intact. No focal deficits. Skin: Patient with extensive erythema extending from the proximal medial thigh all the way down to the toes.  Calf is very tender to touch and warm with significant edema no appreciable abscess or crepitus Psychiatric: Anxious     LABORATORY PANEL:   CBC Recent Labs  Lab 09/06/18 0505  WBC 10.1  HGB 13.6  HCT 40.6  PLT 114*   ------------------------------------------------------------------------------------------------------------------  Chemistries  Recent Labs  Lab 08/31/18 0958  09/06/18 0505  NA 137   < > 133*  K 4.4   < > 4.0  CL 101   < > 101  CO2 26   < > 23  GLUCOSE 96   < > 151*  BUN 15   < > 20  CREATININE 1.00   < > 0.97  CALCIUM 9.1   < > 8.2*  AST 42*  --   --   ALT 46  --   --   BILITOT 0.6  --   --    < > = values in this interval not displayed.   ------------------------------------------------------------------------------------------------------------------  Cardiac Enzymes No results for input(s): TROPONINI in the last 168 hours. ------------------------------------------------------------------------------------------------------------------  RADIOLOGY:  Ct Femur Left W Contrast  Result Date: 09/05/2018 CLINICAL DATA:  History of left thigh abscess with cellulitis EXAM: CT OF THE LOWER RIGHT EXTREMITY WITH CONTRAST TECHNIQUE: Multidetector CT imaging of the lower right extremity was performed according to the standard protocol following intravenous contrast administration. COMPARISON:  07/10/2018 CONTRAST:  145mL OMNIPAQUE 300 FINDINGS: Bones/Joint/Cartilage Degenerative changes of the lumbar spine and left sacroiliac joint seen. No acute fracture or dislocation is noted. No bony erosive  changes are seen. Ligaments Suboptimally assessed by CT. Muscles and Tendons Muscular structures are within normal limits. Soft tissues Persistent soft tissue edema is  noted in the anteromedial left thigh but significantly improved when compared with the prior exam. No demonstrable fluid collection is noted at this time. Mild skin thickening is noted along the medial thigh as well. Mild reactive lymph nodes are noted in the left inguinal region. IMPRESSION: Residual soft tissue changes related to the prior abscess. No demonstrable fluid collection is seen. The overall skin thickening has improved when compared with the prior study. Mild reactive lymph nodes are noted in the left inguinal region. No underlying bony abnormality is noted. Electronically Signed   By: Inez Catalina M.D.   On: 09/05/2018 03:48   US Venous Img Lower Unilateral Left  Result Date: 09/05/2018 CLINICAL DATA:  Cellulitis, redness and swelling x3 days, pain. Previous tobacco abuse. EXAM: LEFT LOWER EXTREMITY VENOUS DOPPLER ULTRASOUND TECHNIQUE: Gray-scale sonography with compression, as well as color and duplex ultrasound, were performed to evaluate the deep venous system from the level of the common femoral vein through the popliteal and proximal calf veins. COMPARISON:  None FINDINGS: Normal compressibility of the common femoral, superficial femoral, and popliteal veins, as well as the proximal calf veins. No filling defects to suggest DVT on grayscale or color Doppler imaging. Doppler waveforms show normal direction of venous flow, normal respiratory phasicity and response to augmentation. Survey views of the contralateral common femoral vein are unremarkable. IMPRESSION: No femoropopliteal and no calf DVT in the visualized calf veins. If clinical symptoms are inconsistent or if there are persistent or worsening symptoms, further imaging (possibly involving the iliac veins) may be warranted. Electronically Signed   By: Lucrezia Europe M.D.   On: 09/05/2018 12:18   Dg Chest Port 1 View  Result Date: 09/04/2018 CLINICAL DATA:  63 y/o M; fever and cellulitis of the left lower extremity. EXAM: PORTABLE CHEST  1 VIEW COMPARISON:  07/06/2018 chest radiograph FINDINGS: Cardiac silhouette within normal limits given projection and technique. No consolidation, effusion, or pneumothorax. Multilevel degenerative changes of the thoracic spine. No acute osseous abnormality is evident. IMPRESSION: No active disease. Electronically Signed   By: Kristine Garbe M.D.   On: 09/04/2018 22:51   Dg Femur Min 2 Views Left  Result Date: 09/04/2018 CLINICAL DATA:  63 y/o M; fever and cellulitis of left lower extremity. EXAM: LEFT FEMUR 2 VIEWS COMPARISON:  None. FINDINGS: There is no evidence of fracture or other focal bone lesions. Narrowing of the left knee joint medial femorotibial compartment. Left hip joint is well maintained. IMPRESSION: No acute bony or articular abnormality. Electronically Signed   By: Kristine Garbe M.D.   On: 09/04/2018 22:53     ASSESSMENT AND PLAN:   63 year old male with history of obesity and debridement for necrotizing soft tissue infection who presented to the emergency room due to concern of left leg cellulitis.   1.  Sepsis with leukocytosis and fever on admission due to left leg cellulitis: CT scan did not show evidence of abscess.  Lower extremity Doppler was negative.  I am concerned of possible developing neck Fash.  I have spoken with PA surgery who will discuss with Dr. Rosana Hoes. I will change ceftriaxone to cefepime and continue vancomycin. ID consultation for further evaluation. Elevate legs.   2. Tobacco dependence: Patient is encouraged to quit smoking and willing to attempt to quit was assessed. Patient NOT highly motivated.Counseling was provided for 4 minutes.  3.  Hyperlipidemia: Continue statin  4.  Hyponatremia: Sodium is improved.   Management plans discussed with the patient and he is in agreement.  CODE STATUS: full  TOTAL TIME TAKING CARE OF THIS PATIENT: 30 minutes.   D/w surgery and nursing   POSSIBLE D/C ??, DEPENDING ON CLINICAL  CONDITION.   Bettey Costa M.D on 09/06/2018 at 11:44 AM  Between 7am to 6pm - Pager - [phone] After 6pm go to [email or web address] - password [email or web address] [facility]  Office  [phone]  CC: Primary care physician; [name], FNP  Note: This dictation was prepared with Dragon dictation along with smaller phrase technology. Any transcriptional errors that result from this process are unintentional.

## 2018-09-06 NOTE — Progress Notes (Signed)
Dr. Benjie Karvonen was notified of elevated HR 118 and resp of 28. Pt also c/o stuffy nose.

## 2018-09-07 ENCOUNTER — Inpatient Hospital Stay: Payer: Medicaid Other

## 2018-09-07 DIAGNOSIS — Z720 Tobacco use: Secondary | ICD-10-CM

## 2018-09-07 DIAGNOSIS — B372 Candidiasis of skin and nail: Secondary | ICD-10-CM

## 2018-09-07 LAB — CBC
HCT: 38.5 % — ABNORMAL LOW (ref 39.0–52.0)
Hemoglobin: 12.9 g/dL — ABNORMAL LOW (ref 13.0–17.0)
MCH: 31.9 pg (ref 26.0–34.0)
MCHC: 33.5 g/dL (ref 30.0–36.0)
MCV: 95.1 fL (ref 80.0–100.0)
Platelets: 120 10*3/uL — ABNORMAL LOW (ref 150–400)
RBC: 4.05 MIL/uL — ABNORMAL LOW (ref 4.22–5.81)
RDW: 12.4 % (ref 11.5–15.5)
WBC: 10.7 10*3/uL — ABNORMAL HIGH (ref 4.0–10.5)
nRBC: 0 % (ref 0.0–0.2)

## 2018-09-07 LAB — BASIC METABOLIC PANEL
Anion gap: 8 (ref 5–15)
BUN: 16 mg/dL (ref 8–23)
CO2: 25 mmol/L (ref 22–32)
Calcium: 8 mg/dL — ABNORMAL LOW (ref 8.9–10.3)
Chloride: 99 mmol/L (ref 98–111)
Creatinine, Ser: 0.88 mg/dL (ref 0.61–1.24)
GFR calc Af Amer: >60 mL/min (ref >60)
GFR calc non Af Amer: >60 mL/min (ref >60)
Glucose, Bld: 137 mg/dL — ABNORMAL HIGH (ref 70–99)
Potassium: 3.8 mmol/L (ref 3.5–5.1)
Sodium: 132 mmol/L — ABNORMAL LOW (ref 135–145)

## 2018-09-07 LAB — AEROBIC CULTURE W GRAM STAIN (SUPERFICIAL SPECIMEN): Special Requests: NORMAL

## 2018-09-07 MED ORDER — IPRATROPIUM-ALBUTEROL 0.5-2.5 (3) MG/3ML IN SOLN: 3.0000 mL | RESPIRATORY_TRACT | Status: DC | PRN

## 2018-09-07 MED ORDER — IOHEXOL 300 MG/ML  SOLN
100.0000 mL | Freq: Once | INTRAMUSCULAR | Status: AC | PRN
  Administered 2018-09-07: 10:00:00 100 mL via INTRAVENOUS

## 2018-09-07 NOTE — Progress Notes (Signed)
[facility] NAME: Corey James    MR#:  [id number]  DATE OF BIRTH:  1956/03/15  SUBJECTIVE:  Patient still with pain of leg and feels not getting better Going for CT scan today   REVIEW OF SYSTEMS:    Review of Systems  Constitutional: Negative for fever, chills weight loss HENT: Negative for ear pain, nosebleeds, congestion, facial swelling, rhinorrhea, neck pain, neck stiffness and ear discharge.   Respiratory: Negative for cough, shortness of breath, wheezing  Cardiovascular: Negative for chest pain, palpitations and leg swelling.  Gastrointestinal: Negative for heartburn, abdominal pain, vomiting, diarrhea or consitpation Genitourinary: Negative for dysuria, urgency, frequency, hematuria Musculoskeletal: Negative for back pain or joint pain Neurological: Negative for dizziness, seizures, syncope, focal weakness,  numbness and headaches.  Hematological: Does not bruise/bleed easily.  Psychiatric/Behavioral: Negative for hallucinations, confusion, dysphoric mood  Skin: Left leg redness and marked edema with pain  Tolerating Diet: yes      DRUG ALLERGIES:   Allergies  Allergen Reactions  . Penicillins Itching and Rash    VITALS:  Blood pressure (!) 141/78, pulse (!) 114, temperature 98.2 F (36.8 C), temperature source Oral, resp. rate (!) 24, height 5\' 9"  (1.753 m), weight (!) 154.2 kg, SpO2 95 %.  PHYSICAL EXAMINATION:  Constitutional: Appears obese. No distress. HENT: Normocephalic. [name] Oropharynx is clear and moist.  Eyes: Conjunctivae and EOM are normal. PERRLA, no scleral icterus.  Neck: Normal ROM. Neck supple. No JVD. No tracheal deviation. CVS: RRR, S1/S2 +, no murmurs, no gallops, no carotid bruit.  Pulmonary: Effort and breath sounds normal, no stridor, rhonchi, wheezes, rales.  Abdominal: Soft. BS +,  no distension, tenderness, rebound or guarding.  Musculoskeletal: Normal range of motion. No edema and  no tenderness.  Neuro: Alert. CN 2-12 grossly intact. No focal deficits. Skin: Patient with extensive erythema extending from the proximal medial thigh all the way down to the toes.  Calf is very tender to touch and warm with significant edema no appreciable abscess or crepitus Psychiatric: Anxious     LABORATORY PANEL:   CBC Recent Labs  Lab 09/07/18 0503  WBC 10.7*  HGB 12.9*  HCT 38.5*  PLT 120*   ------------------------------------------------------------------------------------------------------------------  Chemistries  Recent Labs  Lab 09/07/18 0503  NA 132*  K 3.8  CL 99  CO2 25  GLUCOSE 137*  BUN 16  CREATININE 0.88  CALCIUM 8.0*   ------------------------------------------------------------------------------------------------------------------  Cardiac Enzymes No results for input(s): TROPONINI in the last 168 hours. ------------------------------------------------------------------------------------------------------------------  RADIOLOGY:  Dg Chest 1 View  Result Date: 09/07/2018 CLINICAL DATA:  Lower extremity swelling.  Cellulitis.  Tobacco use. EXAM: CHEST  1 VIEW COMPARISON:  September 04, 2018 FINDINGS: There is no appreciable edema or consolidation. Heart is upper normal in size with pulmonary vascularity normal. No adenopathy. There is degenerative change in the thoracic spine. IMPRESSION: No edema or consolidation.  Heart upper normal in size. Electronically Signed   By: Lowella Grip III M.D.   On: 09/07/2018 09:02   US Venous Img Lower Unilateral Left  Result Date: 09/05/2018 CLINICAL DATA:  Cellulitis, redness and swelling x3 days, pain. Previous tobacco abuse. EXAM: LEFT LOWER EXTREMITY VENOUS DOPPLER ULTRASOUND TECHNIQUE: Gray-scale sonography with compression, as well as color and duplex ultrasound, were performed to evaluate the deep venous system from the level of the common femoral vein through the popliteal and proximal calf veins.  COMPARISON:  None FINDINGS: Normal compressibility of the common femoral, superficial  femoral, and popliteal veins, as well as the proximal calf veins. No filling defects to suggest DVT on grayscale or color Doppler imaging. Doppler waveforms show normal direction of venous flow, normal respiratory phasicity and response to augmentation. Survey views of the contralateral common femoral vein are unremarkable. IMPRESSION: No femoropopliteal and no calf DVT in the visualized calf veins. If clinical symptoms are inconsistent or if there are persistent or worsening symptoms, further imaging (possibly involving the iliac veins) may be warranted. Electronically Signed   By: Lucrezia Europe M.D.   On: 09/05/2018 12:18     ASSESSMENT AND PLAN:   64 year old male with history of obesity and debridement for necrotizing soft tissue infection who presented to the emergency room due to concern of left leg cellulitis.   1.  Sepsis with leukocytosis and fever on admission due to left leg cellulitis: Initial CT scan did not show evidence of abscess.  Lower extremity Doppler was negative.  I am still concerned of possible developing nec fasc. Surgery has recommended to repeat CT scan. Patient evaluated by Dr. Delaine Lame yesterday.  Continue cefepime and clindamycin as per her recommendations.  Continue to elevate leg   2. Tobacco dependence: Patient is encouraged to quit smoking and willing to attempt to quit was assessed. Patient NOT highly motivated.Counseling was provided.  3.  Hyperlipidemia: Continue statin  4.  Hyponatremia: Sodium is stable  5.  Thrombocytopenia: Platelets are stable but continue to monitor   Management plans discussed with the patient and he is in agreement.  CODE STATUS: full  TOTAL TIME TAKING CARE OF THIS PATIENT: 21 minutes.   D/w surgery    POSSIBLE D/C ??, DEPENDING ON CLINICAL CONDITION.   Bettey Costa M.D on 09/07/2018 at 10:47 AM  Between 7am to 6pm - Pager -  [phone] After 6pm go to [email or web address] - password [email or web address] [facility]  Office  [phone]  CC: Primary care physician; [name], FNP  Note: This dictation was prepared with Dragon dictation along with smaller phrase technology. Any transcriptional errors that result from this process are unintentional.

## 2018-09-07 NOTE — Progress Notes (Signed)
   Date of Admission:  09/04/2018    ID: Aristeo Hankerson is a 63 y.o. male  Active Problems:   Sepsis ([place])    Subjective: Feels the same No fever  Medications:  . buPROPion  150 mg Oral BID  . diltiazem  5 mg Intravenous Once  . docusate sodium  100 mg Oral BID  . fluconazole  200 mg Oral Daily  . heparin  5,000 Units Subcutaneous Q8H  . pneumococcal 23 valent vaccine  0.5 mL Intramuscular Tomorrow-1000  . rosuvastatin  20 mg Oral Daily    Objective: Vital signs in last 24 hours: Temp:  [98.2 F (36.8 C)-98.7 F (37.1 C)] 98.2 F (36.8 C) (04/16 0301) Pulse Rate:  [81-118] 114 (04/16 0301) Resp:  [24-28] 24 (04/16 0301) BP: (134-149)/(68-78) 141/78 (04/16 0301) SpO2:  [95 %-100 %] 95 % (04/16 0301) Weight:  [154.2 kg] 154.2 kg (04/16 0500)  PHYSICAL EXAM:  General: Alert, cooperative, no distress, appears stated age.  Lungs: b/l air entry Heart: Regular rate and rhythm, no murmur, rub or gallop. Abdomen: Soft, non-tender,not distended. Bowel sounds normal. No masses Extremities: left leg swollen, idurated , erythema over the thigh a shade lighter, some brawny edema, skin peeling Below knee swollen and indurated- no crepitus       Skin: No rashes or lesions. Or bruising Lymph: Cervical, supraclavicular normal. Neurologic: Grossly non-focal  Lab Results Recent Labs    09/06/18 0505 09/07/18 0503  WBC 10.1 10.7*  HGB 13.6 12.9*  HCT 40.6 38.5*  NA 133* 132*  K 4.0 3.8  CL 101 99  CO2 23 25  BUN 20 16  CREATININE 0.97 0.88   Liver Panel No results for input(s): PROT, ALBUMIN, AST, ALT, ALKPHOS, BILITOT, BILIDIR, IBILI in the last 72 hours. Sedimentation Rate No results for input(s): ESRSEDRATE in the last 72 hours. C-Reactive Protein No results for input(s): CRP in the last 72 hours.  Microbiology:  Studies/Results: Dg Chest 1 View  Result Date: 09/07/2018 CLINICAL DATA:  Lower extremity swelling.  Cellulitis.  Tobacco use. EXAM: CHEST  1 VIEW  COMPARISON:  September 04, 2018 FINDINGS: There is no appreciable edema or consolidation. Heart is upper normal in size with pulmonary vascularity normal. No adenopathy. There is degenerative change in the thoracic spine. IMPRESSION: No edema or consolidation.  Heart upper normal in size. Electronically Signed   By: Lowella Grip III M.D.   On: 09/07/2018 09:02     Assessment/Plan: 63 yr male with morbid obesity, alcohol use, smoker who recently had a necrotizing soft tissue infection of the left thigh surgically debrided in Feb 2020 and had a wound vac till 09/02/18. Presents with redness, swelling and pain of the left leg and thigh  Left leg and thigh cellulitis with brawny edema, no blister or crepitus currently on ceftriaxone and clinda- repeat CT done today . Shows no gas in soft tissue- only edema Observe closely  for evolving abscess or nec fasc. Keep the leg elevated on 4 pillows surgical team has seen patient  Previous left groin necrotizing soft tissue infection , has almost healed but the groin area has severe intertrigo,  candida infection On  fluconazole 200mg  QD   Discussed the management with the patient

## 2018-09-07 NOTE — Progress Notes (Addendum)
[facility] SURGICAL PROGRESS NOTE (cpt [phone])  Hospital Day(s): 2.   Post op day(s):  Corey James   Interval History: Patient seen and examined, no acute events or new complaints overnight. Patient reports that he feels his left lower leg is "heavier" than yesterday but does not feel the pain or erythema are any worse. No fevers yesterday. No complaints of nausea or emesis. He is complaining of congestion. Seen by ID yesterday and vancomycin was switched to clindamycin. Mobilizing. No other complaints.   Review of Systems:  Constitutional: denies fever, chills  HEENT: + congestion  Respiratory: denies any shortness of breath  Cardiovascular: denies chest pain or palpitations  Gastrointestinal: denies N/V, or diarrhea/and bowel function as per interval history Integumentary: + LLE Cellulitis   Vital signs in last 24 hours: [min-max] current  Temp:  [98.2 F (36.8 C)-98.7 F (37.1 C)] 98.2 F (36.8 C) (04/16 0301) Pulse Rate:  [81-118] 114 (04/16 0301) Resp:  [24-28] 24 (04/16 0301) BP: (134-149)/(68-78) 141/78 (04/16 0301) SpO2:  [95 %-100 %] 95 % (04/16 0301) Weight:  [154.2 kg] 154.2 kg (04/16 0500)     Height: 5\' 9"  (175.3 cm) Weight: (!) 154.2 kg BMI (Calculated): 50.19   Intake/Output this shift:  No intake/output data recorded.    Physical Exam:  Constitutional: alert, cooperative and no distress  HENT: normocephalic without obvious abnormality  Eyes: PERRL, EOM's grossly intact and symmetric  Respiratory: breathing non-labored at rest  Integumentary: +tender to palpation Left proximal medial thigh to knee and Left anterior and posterior calf erythema, +Left groin crease excoriation without previously recommended dry gauze or towel, no appreciable crepitus or fluctuance, stable/unchanged LLE edema  LLE - 09/07/2018:      Labs:  CBC Latest Ref Rng & Units 09/07/2018 09/06/2018 09/05/2018  WBC 4.0 - 10.5 K/uL 10.7(H) 10.1 13.5(H)  Hemoglobin 13.0 - 17.0 g/dL  12.9(L) 13.6 15.5  Hematocrit 39.0 - 52.0 % 38.5(L) 40.6 46.7  Platelets 150 - 400 K/uL 120(L) 114(L) 124(L)   CMP Latest Ref Rng & Units 09/07/2018 09/06/2018 09/04/2018  Glucose 70 - 99 mg/dL 137(H) 151(H) 214(H)  BUN 8 - 23 mg/dL 16 20 22   Creatinine 0.61 - 1.24 mg/dL 0.88 0.97 1.27(H)  Sodium 135 - 145 mmol/L 132(L) 133(L) 131(L)  Potassium 3.5 - 5.1 mmol/L 3.8 4.0 4.0  Chloride 98 - 111 mmol/L 99 101 100  CO2 22 - 32 mmol/L 25 23 23   Calcium 8.9 - 10.3 mg/dL 8.0(L) 8.2(L) 8.4(L)  Total Protein 6.1 - 8.1 g/dL - - -  Total Bilirubin 0.2 - 1.2 mg/dL - - -  Alkaline Phos 38 - 126 U/L - - -  AST 10 - 35 U/L - - -  ALT 9 - 46 U/L - - -     Imaging studies: No new pertinent imaging studies   Assessment/Plan: (ICD-10's: L03.116) 63 y.o. male with stable mild leukocytosis without fever attributable to left lower leg cellulitis without palpable fluctuance or crepitus, 2 months s/p debridement for necrotizing soft tissue infection , and complicated by pertinent comorbidities including obesity and chronic ongoing tobacco abuse (smoking).   - Will make NPO pending CT results  - Will obtain CT of LLE to further evaluate for worsening infection  - Continue ABx; now on clindamycin + rocephin  - Fluconazole added for concern over candidal infection   - Monitor leukocytosis; vitals  - No indication for emergent surgical intervention at this time, will continue to monitor for sign of abscess development or  necrotizing soft tissue infection, for which patient expresses understanding surgery would be indicated    - Appreciate ID involvement  - Further management per primary team   All of the above findings and recommendations were discussed with the patient, and the medical team, and all of patient's questions were answered to his expressed satisfaction.   -- Edison Simon, PA-C [facility] 09/07/2018, 7:18 AM [phone] M-F: 7am - 4pm

## 2018-09-08 ENCOUNTER — Ambulatory Visit: Payer: Self-pay | Admitting: Surgery

## 2018-09-08 LAB — CBC
HCT: 38.6 % — ABNORMAL LOW (ref 39.0–52.0)
Hemoglobin: 12.9 g/dL — ABNORMAL LOW (ref 13.0–17.0)
MCH: 31.3 pg (ref 26.0–34.0)
MCHC: 33.4 g/dL (ref 30.0–36.0)
MCV: 93.7 fL (ref 80.0–100.0)
Platelets: 142 10*3/uL — ABNORMAL LOW (ref 150–400)
RBC: 4.12 MIL/uL — ABNORMAL LOW (ref 4.22–5.81)
RDW: 12.4 % (ref 11.5–15.5)
WBC: 10 10*3/uL (ref 4.0–10.5)
nRBC: 0 % (ref 0.0–0.2)

## 2018-09-08 MED ORDER — KETOROLAC TROMETHAMINE 30 MG/ML IJ SOLN
30.0000 mg | Freq: Four times a day (QID) | INTRAMUSCULAR | Status: DC | PRN
  Administered 2018-09-08 – 2018-09-10 (×4): 30 mg via INTRAVENOUS
  Filled 2018-09-08 (×4): qty 1

## 2018-09-08 MED ORDER — SODIUM CHLORIDE 0.9 % IV SOLN
INTRAVENOUS | Status: DC | PRN
  Administered 2018-09-08 – 2018-09-10 (×5): via INTRAVENOUS

## 2018-09-08 NOTE — Progress Notes (Addendum)
[facility] SURGICAL PROGRESS NOTE (cpt [phone])  Hospital Day(s): 3.   Post op day(s):  [name]   Interval History: Patient seen and examined, no acute events or new complaints overnight. Patient reports that his leg is feeling better this morning although it is still very erythematous and edematous. No fevers overnight. No reports of chills, CP, SOB, nausea, or emesis. WBC is back to normal limits today. No other complaints.    Review of Systems:  Constitutional: denies fever, chills   Respiratory: denies any shortness of breath  Cardiovascular: denies chest pain or palpitations  Gastrointestinal: denies N/V, or diarrhea/and  Integumentary: + LLE Cellulitis   Vital signs in last 24 hours: [min-max] current  Temp:  [98 F (36.7 C)-98.2 F (36.8 C)] 98 F (36.7 C) (04/17 0405) Pulse Rate:  [95-112] 112 (04/17 0405) Resp:  [20] 20 (04/17 0405) BP: (117-149)/(61-79) 141/73 (04/17 0405) SpO2:  [96 %-100 %] 96 % (04/17 0405)     Height: 5\' 9"  (175.3 cm) Weight: (!) 154.2 kg BMI (Calculated): 50.19   Intake/Output this shift:  No intake/output data recorded.     Physical Exam:  Constitutional: alert, cooperative and no distress  HENT: normocephalic without obvious abnormality  Eyes: PERRL, EOM's grossly intact and symmetric  Respiratory: breathing non-labored at rest  Cardiovascular: regular rate and sinus rhythm  Integumentary: erythema extending from the proximal medial left thigh extending to the left foot, erythema and edema worsen throughout the calf, tender to palpation, no appreciable abscess or crepitus. Very small single blister to the medial calf. Inguinal fold is macerated.   Labs:  CBC Latest Ref Rng & Units 09/08/2018 09/07/2018 09/06/2018  WBC 4.0 - 10.5 K/uL 10.0 10.7(H) 10.1  Hemoglobin 13.0 - 17.0 g/dL 12.9(L) 12.9(L) 13.6  Hematocrit 39.0 - 52.0 % 38.6(L) 38.5(L) 40.6  Platelets 150 - 400 K/uL 142(L) 120(L) 114(L)   CMP Latest Ref Rng & Units  09/07/2018 09/06/2018 09/04/2018  Glucose 70 - 99 mg/dL 137(H) 151(H) 214(H)  BUN 8 - 23 mg/dL 16 20 22   Creatinine 0.61 - 1.24 mg/dL 0.88 0.97 1.27(H)  Sodium 135 - 145 mmol/L 132(L) 133(L) 131(L)  Potassium 3.5 - 5.1 mmol/L 3.8 4.0 4.0  Chloride 98 - 111 mmol/L 99 101 100  CO2 22 - 32 mmol/L 25 23 23   Calcium 8.9 - 10.3 mg/dL 8.0(L) 8.2(L) 8.4(L)  Total Protein 6.1 - 8.1 g/dL - - -  Total Bilirubin 0.2 - 1.2 mg/dL - - -  Alkaline Phos 38 - 126 U/L - - -  AST 10 - 35 U/L - - -  ALT 9 - 46 U/L - - -     Imaging studies: No new pertinent imaging studies   Assessment/Plan: (ICD-10's: L03.116) 63 y.o. male with resolved leukocytosis still with significant erythema to the left leg attributable to left lower leg cellulitis without palpable fluctuance or crepitus without soft tissue gas or fluid collections on repeat CT yesterday 4/16, 2 months s/p debridement for necrotizing soft tissue infection , and complicated by pertinent comorbidities including obesity and chronic ongoing tobacco abuse (smoking).   - No indications for emergent surgical management currently; will continue to closely monitor for signs of developing abscess or necrotizing infection  - Continue IV Abx (Clindamycin + Rocephin); appreciate ID help  - Continue Fluconazole  - Please continue to place gauze in left thigh skin folds to reduce moisture  - Keep left leg elevated with 4 pillows  - Monitor leukocytosis  - Further management per  primary team.    All of the above findings and recommendations were discussed with the patient, and the medical team, and all of patient's questions were answered to his expressed satisfaction.  -- Edison Simon, PA-C [facility] 09/08/2018, 7:31 AM [id number] M-F: 7am - 4pm  I saw and evaluated the patient.  I agree with the above documentation, exam, and plan, which I have edited where appropriate. Fredirick Maudlin  10:12 AM

## 2018-09-08 NOTE — Progress Notes (Signed)
[place] [facility] [place] NAME: Corey James    MR#:  [id number]  DATE OF BIRTH:  06-19-1955  SUBJECTIVE:  Feels like leg is a bit better and less red this a,  REVIEW OF SYSTEMS:    Review of Systems  Constitutional: Negative for fever, chills weight loss HENT: Negative for ear pain, nosebleeds, congestion, facial swelling, rhinorrhea, neck pain, neck stiffness and ear discharge.   Respiratory: Negative for cough, shortness of breath, wheezing  Cardiovascular: Negative for chest pain, palpitations and leg swelling.  Gastrointestinal: Negative for heartburn, abdominal pain, vomiting, diarrhea or consitpation Genitourinary: Negative for dysuria, urgency, frequency, hematuria Musculoskeletal: Negative for back pain or joint pain Neurological: Negative for dizziness, seizures, syncope, focal weakness,  numbness and headaches.  Hematological: Does not bruise/bleed easily.  Psychiatric/Behavioral: Negative for hallucinations, confusion, dysphoric mood  Skin: warm Left leg redness and marked edema with pain decreased from yesterday   Tolerating Diet: yes      DRUG ALLERGIES:   Allergies  Allergen Reactions  . Penicillins Itching and Rash    VITALS:  Blood pressure (!) 141/73, pulse (!) 112, temperature 98 F (36.7 C), temperature source Oral, resp. rate 20, height 5\' 9"  (1.753 m), weight (!) 154.2 kg, SpO2 96 %.  PHYSICAL EXAMINATION:  Constitutional: Appears obese. No distress. HENT: Normocephalic. [name] Oropharynx is clear and moist.  Eyes: Conjunctivae and EOM are normal. PERRLA, no scleral icterus.  Neck: Normal ROM. Neck supple. No JVD. No tracheal deviation. CVS: RRR, S1/S2 +, no murmurs, no gallops, no carotid bruit.  Pulmonary: Effort and breath sounds normal, no stridor, rhonchi, wheezes, rales.  Abdominal: Soft. BS +,  no distension, tenderness, rebound or guarding.  Musculoskeletal: Normal range of motion. No edema and no  tenderness.  Neuro: Alert. CN 2-12 grossly intact. No focal deficits. Skin: erythema extending from the proximal medial left thigh extending to the left foot, erythema and edema worsen throughout the calf, tender to palpation, no appreciable abscess or crepitus. Very small single blister to the medial calf. Inguinal fold is macerated. Intertrigo Psychiatric: normal    LABORATORY PANEL:   CBC Recent Labs  Lab 09/08/18 0615  WBC 10.0  HGB 12.9*  HCT 38.6*  PLT 142*   ------------------------------------------------------------------------------------------------------------------  Chemistries  Recent Labs  Lab 09/07/18 0503  NA 132*  K 3.8  CL 99  CO2 25  GLUCOSE 137*  BUN 16  CREATININE 0.88  CALCIUM 8.0*   ------------------------------------------------------------------------------------------------------------------  Cardiac Enzymes No results for input(s): TROPONINI in the last 168 hours. ------------------------------------------------------------------------------------------------------------------  RADIOLOGY:  Dg Chest 1 View  Result Date: 09/07/2018 CLINICAL DATA:  Lower extremity swelling.  Cellulitis.  Tobacco use. EXAM: CHEST  1 VIEW COMPARISON:  September 04, 2018 FINDINGS: There is no appreciable edema or consolidation. Heart is upper normal in size with pulmonary vascularity normal. No adenopathy. There is degenerative change in the thoracic spine. IMPRESSION: No edema or consolidation.  Heart upper normal in size. Electronically Signed   By: Lowella Grip III M.D.   On: 09/07/2018 09:02   Ct Tibia Fibula Left W Contrast  Result Date: 09/07/2018 CLINICAL DATA:  Worsening left leg cellulitis and leukocytosis. EXAM: CT OF THE LOWER LEFT EXTREMITY WITH CONTRAST TECHNIQUE: Multidetector CT imaging of the lower right extremity was performed according to the standard protocol following intravenous contrast administration. COMPARISON:  CT left femur from 2 days  ago and 07/10/2018. CONTRAST:  161mL OMNIPAQUE IOHEXOL 300 MG/ML  SOLN FINDINGS: Bones/Joint/Cartilage No  acute fracture or dislocation. No osseous destruction or periosteal reaction. Mild degenerative changes of the left hip and knee joints again noted. Mild posterior subtalar osteoarthritis. Bone mineralization is normal. No joint effusion. Ligaments Suboptimally assessed by CT. Muscles and Tendons Intact. Soft tissues Skin thickening and subcutaneous soft tissue swelling of the mid to distal thigh have slightly worsened, especially along the lateral aspect. There is prominent circumferential skin thickening and subcutaneous soft tissue swelling of the lower leg extending into the dorsal foot. No fluid collection or soft tissue mass. No subcutaneous emphysema. Mildly enlarged left inguinal lymph nodes are unchanged and remain likely reactive. IMPRESSION: 1. Findings consistent with cellulitis of the left leg. Skin thickening and subcutaneous soft tissue swelling of the mid to distal thigh has slightly worsened when compared to prior study from two days ago. 2. No abscess. 3.  No acute osseous abnormality. Electronically Signed   By: Titus Dubin M.D.   On: 09/07/2018 11:35   Ct Femur Left W Contrast  Result Date: 09/07/2018 CLINICAL DATA:  Worsening left leg cellulitis and leukocytosis. EXAM: CT OF THE LOWER LEFT EXTREMITY WITH CONTRAST TECHNIQUE: Multidetector CT imaging of the lower right extremity was performed according to the standard protocol following intravenous contrast administration. COMPARISON:  CT left femur from 2 days ago and 07/10/2018. CONTRAST:  156mL OMNIPAQUE IOHEXOL 300 MG/ML  SOLN FINDINGS: Bones/Joint/Cartilage No acute fracture or dislocation. No osseous destruction or periosteal reaction. Mild degenerative changes of the left hip and knee joints again noted. Mild posterior subtalar osteoarthritis. Bone mineralization is normal. No joint effusion. Ligaments Suboptimally assessed by  CT. Muscles and Tendons Intact. Soft tissues Skin thickening and subcutaneous soft tissue swelling of the mid to distal thigh have slightly worsened, especially along the lateral aspect. There is prominent circumferential skin thickening and subcutaneous soft tissue swelling of the lower leg extending into the dorsal foot. No fluid collection or soft tissue mass. No subcutaneous emphysema. Mildly enlarged left inguinal lymph nodes are unchanged and remain likely reactive. IMPRESSION: 1. Findings consistent with cellulitis of the left leg. Skin thickening and subcutaneous soft tissue swelling of the mid to distal thigh has slightly worsened when compared to prior study from two days ago. 2. No abscess. 3.  No acute osseous abnormality. Electronically Signed   By: Titus Dubin M.D.   On: 09/07/2018 11:35   Ct Foot Left W Contrast  Result Date: 09/07/2018 CLINICAL DATA:  Worsening left leg cellulitis and leukocytosis. EXAM: CT OF THE LOWER LEFT EXTREMITY WITH CONTRAST TECHNIQUE: Multidetector CT imaging of the lower right extremity was performed according to the standard protocol following intravenous contrast administration. COMPARISON:  CT left femur from 2 days ago and 07/10/2018. CONTRAST:  144mL OMNIPAQUE IOHEXOL 300 MG/ML  SOLN FINDINGS: Bones/Joint/Cartilage No acute fracture or dislocation. No osseous destruction or periosteal reaction. Mild degenerative changes of the left hip and knee joints again noted. Mild posterior subtalar osteoarthritis. Bone mineralization is normal. No joint effusion. Ligaments Suboptimally assessed by CT. Muscles and Tendons Intact. Soft tissues Skin thickening and subcutaneous soft tissue swelling of the mid to distal thigh have slightly worsened, especially along the lateral aspect. There is prominent circumferential skin thickening and subcutaneous soft tissue swelling of the lower leg extending into the dorsal foot. No fluid collection or soft tissue mass. No subcutaneous  emphysema. Mildly enlarged left inguinal lymph nodes are unchanged and remain likely reactive. IMPRESSION: 1. Findings consistent with cellulitis of the left leg. Skin thickening and subcutaneous soft tissue swelling  of the mid to distal thigh has slightly worsened when compared to prior study from two days ago. 2. No abscess. 3.  No acute osseous abnormality. Electronically Signed   By: Titus Dubin M.D.   On: 09/07/2018 11:35     ASSESSMENT AND PLAN:   63 year old male with history of obesity and debridement for necrotizing soft tissue infection who presented to the emergency room due to concern of left leg cellulitis.   1.  Sepsis with leukocytosis and fever on admission due to left leg cellulitis with previous left groin necrotizing soft tissue infection: Initial CT scan did not show evidence of abscess.  Lower extremity Doppler was negative.  I am still concerned of possible developing nec fasc. Repeat Ct scan does not show soft tissue gas or fluid  Continue Rocephin and clindamycin as per the recommendations.  Continue to elevate leg for pillows Continue fluconazole for intertrigo   2. Tobacco dependence: Patient is encouraged to quit smoking and willing to attempt to quit was assessed. Patient NOT highly motivated.Counseling was provided.  3.  Hyperlipidemia: Continue statin  4.  Hyponatremia: Sodium is stable  5.  Thrombocytopenia: Platelets are stable but continue to monitor   Management plans discussed with the patient and he is in agreement.  CODE STATUS: full  TOTAL TIME TAKING CARE OF THIS PATIENT: 21 minutes.   D/w surgery    POSSIBLE D/C ??, DEPENDING ON CLINICAL CONDITION.   Bettey Costa M.D on 09/08/2018 at 10:32 AM  Between 7am to 6pm - Pager - [phone] After 6pm go to [email or web address] - password [email or web address] [facility]  Office  [phone]  CC: Primary care physician; [name], FNP  Note: This dictation was prepared with  Dragon dictation along with smaller phrase technology. Any transcriptional errors that result from this process are unintentional.

## 2018-09-08 NOTE — Progress Notes (Signed)
Date of Admission:  09/04/2018       Subjective: Sleeping in recliner  Medications:   buPROPion  150 mg Oral BID   diltiazem  5 mg Intravenous Once   docusate sodium  100 mg Oral BID   fluconazole  200 mg Oral Daily   heparin  5,000 Units Subcutaneous Q8H   pneumococcal 23 valent vaccine  0.5 mL Intramuscular Tomorrow-1000   rosuvastatin  20 mg Oral Daily    Objective: Vital signs in last 24 hours: Temp:  [98 F (36.7 C)-98.2 F (36.8 C)] 98 F (36.7 C) (04/17 0405) Pulse Rate:  [95-112] 112 (04/17 0405) Resp:  [20] 20 (04/17 0405) BP: (117-149)/(61-79) 141/73 (04/17 0405) SpO2:  [96 %-100 %] 96 % (04/17 0405)  PHYSICAL EXAM:  Erythema and edema persist. Some weeping from the calf area- serous fluid      Lab Results Recent Labs    09/06/18 0505 09/07/18 0503 09/08/18 0615  WBC 10.1 10.7* 10.0  HGB 13.6 12.9* 12.9*  HCT 40.6 38.5* 38.6*  NA 133* 132*  --   K 4.0 3.8  --   CL 101 99  --   CO2 23 25  --   BUN 20 16  --   CREATININE 0.97 0.88  --    Liver Panel No results for input(s): PROT, ALBUMIN, AST, ALT, ALKPHOS, BILITOT, BILIDIR, IBILI in the last 72 hours. Sedimentation Rate No results for input(s): ESRSEDRATE in the last 72 hours. C-Reactive Protein No results for input(s): CRP in the last 72 hours.  Microbiology:  Studies/Results: Dg Chest 1 View  Result Date: 09/07/2018 CLINICAL DATA:  Lower extremity swelling.  Cellulitis.  Tobacco use. EXAM: CHEST  1 VIEW COMPARISON:  September 04, 2018 FINDINGS: There is no appreciable edema or consolidation. Heart is upper normal in size with pulmonary vascularity normal. No adenopathy. There is degenerative change in the thoracic spine. IMPRESSION: No edema or consolidation.  Heart upper normal in size. Electronically Signed   By: Lowella Grip III M.D.   On: 09/07/2018 09:02   Ct Tibia Fibula Left W Contrast  Result Date: 09/07/2018 CLINICAL DATA:  Worsening left leg cellulitis and  leukocytosis. EXAM: CT OF THE LOWER LEFT EXTREMITY WITH CONTRAST TECHNIQUE: Multidetector CT imaging of the lower right extremity was performed according to the standard protocol following intravenous contrast administration. COMPARISON:  CT left femur from 2 days ago and 07/10/2018. CONTRAST:  172mL OMNIPAQUE IOHEXOL 300 MG/ML  SOLN FINDINGS: Bones/Joint/Cartilage No acute fracture or dislocation. No osseous destruction or periosteal reaction. Mild degenerative changes of the left hip and knee joints again noted. Mild posterior subtalar osteoarthritis. Bone mineralization is normal. No joint effusion. Ligaments Suboptimally assessed by CT. Muscles and Tendons Intact. Soft tissues Skin thickening and subcutaneous soft tissue swelling of the mid to distal thigh have slightly worsened, especially along the lateral aspect. There is prominent circumferential skin thickening and subcutaneous soft tissue swelling of the lower leg extending into the dorsal foot. No fluid collection or soft tissue mass. No subcutaneous emphysema. Mildly enlarged left inguinal lymph nodes are unchanged and remain likely reactive. IMPRESSION: 1. Findings consistent with cellulitis of the left leg. Skin thickening and subcutaneous soft tissue swelling of the mid to distal thigh has slightly worsened when compared to prior study from two days ago. 2. No abscess. 3.  No acute osseous abnormality. Electronically Signed   By: Titus Dubin M.D.   On: 09/07/2018 11:35   Ct Femur Left W Contrast  Result Date: 09/07/2018 CLINICAL DATA:  Worsening left leg cellulitis and leukocytosis. EXAM: CT OF THE LOWER LEFT EXTREMITY WITH CONTRAST TECHNIQUE: Multidetector CT imaging of the lower right extremity was performed according to the standard protocol following intravenous contrast administration. COMPARISON:  CT left femur from 2 days ago and 07/10/2018. CONTRAST:  174mL OMNIPAQUE IOHEXOL 300 MG/ML  SOLN FINDINGS: Bones/Joint/Cartilage No acute  fracture or dislocation. No osseous destruction or periosteal reaction. Mild degenerative changes of the left hip and knee joints again noted. Mild posterior subtalar osteoarthritis. Bone mineralization is normal. No joint effusion. Ligaments Suboptimally assessed by CT. Muscles and Tendons Intact. Soft tissues Skin thickening and subcutaneous soft tissue swelling of the mid to distal thigh have slightly worsened, especially along the lateral aspect. There is prominent circumferential skin thickening and subcutaneous soft tissue swelling of the lower leg extending into the dorsal foot. No fluid collection or soft tissue mass. No subcutaneous emphysema. Mildly enlarged left inguinal lymph nodes are unchanged and remain likely reactive. IMPRESSION: 1. Findings consistent with cellulitis of the left leg. Skin thickening and subcutaneous soft tissue swelling of the mid to distal thigh has slightly worsened when compared to prior study from two days ago. 2. No abscess. 3.  No acute osseous abnormality. Electronically Signed   By: Titus Dubin M.D.   On: 09/07/2018 11:35   Ct Foot Left W Contrast  Result Date: 09/07/2018 CLINICAL DATA:  Worsening left leg cellulitis and leukocytosis. EXAM: CT OF THE LOWER LEFT EXTREMITY WITH CONTRAST TECHNIQUE: Multidetector CT imaging of the lower right extremity was performed according to the standard protocol following intravenous contrast administration. COMPARISON:  CT left femur from 2 days ago and 07/10/2018. CONTRAST:  144mL OMNIPAQUE IOHEXOL 300 MG/ML  SOLN FINDINGS: Bones/Joint/Cartilage No acute fracture or dislocation. No osseous destruction or periosteal reaction. Mild degenerative changes of the left hip and knee joints again noted. Mild posterior subtalar osteoarthritis. Bone mineralization is normal. No joint effusion. Ligaments Suboptimally assessed by CT. Muscles and Tendons Intact. Soft tissues Skin thickening and subcutaneous soft tissue swelling of the mid to  distal thigh have slightly worsened, especially along the lateral aspect. There is prominent circumferential skin thickening and subcutaneous soft tissue swelling of the lower leg extending into the dorsal foot. No fluid collection or soft tissue mass. No subcutaneous emphysema. Mildly enlarged left inguinal lymph nodes are unchanged and remain likely reactive. IMPRESSION: 1. Findings consistent with cellulitis of the left leg. Skin thickening and subcutaneous soft tissue swelling of the mid to distal thigh has slightly worsened when compared to prior study from two days ago. 2. No abscess. 3.  No acute osseous abnormality. Electronically Signed   By: Titus Dubin M.D.   On: 09/07/2018 11:35     Assessment/Plan: 63 yr male with morbid obesity, alcohol use, smoker who recently had a necrotizing soft tissue infection of the left thigh surgically debrided in Feb 2020 and had a wound vac till 09/02/18. Presents with redness, swelling and pain of the left leg and thigh  Left leg and thigh cellulitis with brawny edema, currently on ceftriaxone and clinda- repeat CT done today . Shows no gas in soft tissue- only edema Observe closely for evolving abscess or nec fasc. Keep the leg elevated on 4 pillows ( should be above heart level).  May consider  ACE wrap surgical team has seen patient and no intervention is being considered currently  Previous left groin necrotizing soft tissue infection , has almost healed but the groin area  has severe intertrigo,  candida infection On  fluconazole 200mg  QD   ID will follow the patient peripherally this weekend- call if needed

## 2018-09-09 LAB — CULTURE, BLOOD (ROUTINE X 2)
Culture: NO GROWTH
Special Requests: ADEQUATE

## 2018-09-09 NOTE — Progress Notes (Signed)
[place] at [place] NAME: Corey James    MR#:  [id number]  DATE OF BIRTH:  [birth year]  SUBJECTIVE:  Wants to go home   REVIEW OF SYSTEMS:    Review of Systems  Constitutional: Negative for fever, chills weight loss HENT: Negative for ear pain, nosebleeds, congestion, facial swelling, rhinorrhea, neck pain, neck stiffness and ear discharge.   Respiratory: Negative for cough, shortness of breath, wheezing  Cardiovascular: Negative for chest pain, palpitations and leg swelling.  Gastrointestinal: Negative for heartburn, abdominal pain, vomiting, diarrhea or consitpation Genitourinary: Negative for dysuria, urgency, frequency, hematuria Musculoskeletal: Negative for back pain or joint pain Neurological: Negative for dizziness, seizures, syncope, focal weakness,  numbness and headaches.  Hematological: Does not bruise/bleed easily.  Psychiatric/Behavioral: Negative for hallucinations, confusion, dysphoric mood  Skin: Warm to touch Left leg redness and edema with pain  Tolerating Diet: yes      DRUG ALLERGIES:   Allergies  Allergen Reactions  . Penicillins Itching and Rash    VITALS:  Blood pressure 125/72, pulse (!) 116, temperature 98.8 F (37.1 C), temperature source Oral, resp. rate 18, height 5\' 9"  (1.753 m), weight (!) 154.2 kg, SpO2 94 %.  PHYSICAL EXAMINATION:  Constitutional: Appears obese. No distress. HENT: Normocephalic. [name] Oropharynx is clear and moist.  Eyes: Conjunctivae and EOM are normal. PERRLA, no scleral icterus.  Neck: Normal ROM. Neck supple. No JVD. No tracheal deviation. CVS: RRR, S1/S2 +, no murmurs, no gallops, no carotid bruit.  Pulmonary: Effort and breath sounds normal, no stridor, rhonchi, wheezes, rales.  Abdominal: Soft. BS +,  no distension, tenderness, rebound or guarding.  Musculoskeletal: Normal range of motion. No edema and no tenderness.  Neuro: Alert. CN 2-12 grossly intact. No focal  deficits. Skin: erythema extending from the proximal medial left thigh extending to the left foot, erythema and edema worsen throughout the calf, remains tender to palpation, no appreciable abscess or crepitus.  Intertrigo Psychiatric: normal    LABORATORY PANEL:   CBC Recent Labs  Lab 09/08/18 0615  WBC 10.0  HGB 12.9*  HCT 38.6*  PLT 142*   ------------------------------------------------------------------------------------------------------------------  Chemistries  Recent Labs  Lab 09/07/18 0503  NA 132*  K 3.8  CL 99  CO2 25  GLUCOSE 137*  BUN 16  CREATININE 0.88  CALCIUM 8.0*   ------------------------------------------------------------------------------------------------------------------  Cardiac Enzymes No results for input(s): TROPONINI in the last 168 hours. ------------------------------------------------------------------------------------------------------------------  RADIOLOGY:  No results found.   ASSESSMENT AND PLAN:   63 year old male with history of obesity and debridement for necrotizing soft tissue infection who presented to the emergency room due to concern of left leg cellulitis.   1.  Sepsis with leukocytosis and fever on admission due to left leg cellulitis with previous left groin necrotizing soft tissue infection: Initial CT scan did not show evidence of abscess.  Lower extremity Doppler was negative.  Repeat Ct scan does not show soft tissue gas or fluid  Continue Rocephin and clindamycin as per ID recommendations.  Continue to elevate leg on 4 pillows (only 2 today) Continue fluconazole for intertrigo   2. Tobacco dependence: Patient is encouraged to quit smoking and willing to attempt to quit was assessed. Patient NOT highly motivated.Counseling was provided.  3.  Hyperlipidemia: Continue statin  4.  Hyponatremia: Sodium is stable  5.  Thrombocytopenia: Platelets are stable but continue to monitor   Management plans  discussed with the patient and he is in agreement.  CODE STATUS: full  TOTAL TIME TAKING CARE OF THIS PATIENT: 23 minutes.      POSSIBLE D/C ??, DEPENDING ON CLINICAL CONDITION.   Bettey Costa M.D on 09/09/2018 at 10:55 AM  Between 7am to 6pm - Pager - [phone] After 6pm go to [email or web address] - password [email or web address]  Office  [phone]  CC: Primary care physician; [name], FNP  Note: This dictation was prepared with Dragon dictation along with smaller phrase technology. Any transcriptional errors that result from this process are unintentional.

## 2018-09-09 NOTE — Progress Notes (Addendum)
[facility] SURGICAL PROGRESS NOTE (cpt [phone])  Hospital Day(s): 4.   Post op day(s):  [name]   Interval History: Patient seen and examined, no acute events or new complaints overnight. Patient reports that his leg is feeling better this morning although it is still very erythematous and edematous. No fevers overnight. No reports of chills, CP, SOB, nausea, or emesis.  No new labs were obtained today.  Review of Systems:  Constitutional: denies fever, chills   Respiratory: denies any shortness of breath  Cardiovascular: denies chest pain or palpitations  Gastrointestinal: denies N/V, or diarrhea/and  Integumentary: + LLE Cellulitis   Vital signs in last 24 hours: [min-max] current  Temp:  [97.9 F (36.6 C)-98.8 F (37.1 C)] 98.8 F (37.1 C) (04/18 0334) Pulse Rate:  [85-116] 116 (04/18 0334) Resp:  [17-18] 18 (04/17 2100) BP: (123-125)/(66-72) 125/72 (04/17 2100) SpO2:  [94 %-99 %] 94 % (04/18 0334)     Height: 5\' 9"  (175.3 cm) Weight: (!) 154.2 kg BMI (Calculated): 50.19   Intake/Output this shift:  No intake/output data recorded.     Physical Exam:  Constitutional: alert, cooperative and no distress  HENT: normocephalic without obvious abnormality  Eyes: PERRL, EOM's grossly intact and symmetric  Respiratory: breathing non-labored at rest  Cardiovascular: regular rate and sinus rhythm  Integumentary: erythema extending from the proximal medial left thigh extending to the left foot, erythema and edema worsen throughout the calf, tender to palpation, no appreciable abscess or crepitus. Very small single blister to the medial calf--improved today. Inguinal fold is macerated, however patient states that nurses are applying gauze.  His leg is elevated on only 2 pillows, rather than the recommended 4.   Labs:  CBC Latest Ref Rng & Units 09/08/2018 09/07/2018 09/06/2018  WBC 4.0 - 10.5 K/uL 10.0 10.7(H) 10.1  Hemoglobin 13.0 - 17.0 g/dL 12.9(L) 12.9(L) 13.6   Hematocrit 39.0 - 52.0 % 38.6(L) 38.5(L) 40.6  Platelets 150 - 400 K/uL 142(L) 120(L) 114(L)   CMP Latest Ref Rng & Units 09/07/2018 09/06/2018 09/04/2018  Glucose 70 - 99 mg/dL 137(H) 151(H) 214(H)  BUN 8 - 23 mg/dL 16 20 22   Creatinine 0.61 - 1.24 mg/dL 0.88 0.97 1.27(H)  Sodium 135 - 145 mmol/L 132(L) 133(L) 131(L)  Potassium 3.5 - 5.1 mmol/L 3.8 4.0 4.0  Chloride 98 - 111 mmol/L 99 101 100  CO2 22 - 32 mmol/L 25 23 23   Calcium 8.9 - 10.3 mg/dL 8.0(L) 8.2(L) 8.4(L)  Total Protein 6.1 - 8.1 g/dL - - -  Total Bilirubin 0.2 - 1.2 mg/dL - - -  Alkaline Phos 38 - 126 U/L - - -  AST 10 - 35 U/L - - -  ALT 9 - 46 U/L - - -     Imaging studies: No new pertinent imaging studies   Assessment/Plan: (ICD-10's: L03.116) 63 y.o. male with resolved leukocytosis still with significant erythema to the left leg attributable to left lower leg cellulitis without palpable fluctuance or crepitus without soft tissue gas or fluid collections on repeat CT 4/16, 2 months s/p debridement for necrotizing soft tissue infection , and complicated by pertinent comorbidities including obesity and chronic ongoing tobacco abuse (smoking).   - No indications for emergent surgical management currently; will continue to closely monitor for signs of developing abscess or necrotizing infection  - Continue IV Abx per infectious disease recommendations; appreciate ID help  - Continue Fluconazole  - Please continue to place gauze in left thigh skin folds to reduce moisture  -  Keep left leg elevated with 4 pillows--like should be kept at the level of the heart.  - Monitor leukocytosis  - Further management per primary team.

## 2018-09-10 ENCOUNTER — Encounter: Payer: Self-pay | Admitting: Surgery

## 2018-09-10 LAB — CULTURE, BLOOD (ROUTINE X 2)
Culture: NO GROWTH
Special Requests: ADEQUATE

## 2018-09-10 MED ORDER — CEPHALEXIN 500 MG PO CAPS: 500.0000 mg | ORAL_CAPSULE | Freq: Three times a day (TID) | ORAL | 0 refills | Status: AC

## 2018-09-10 MED ORDER — CLINDAMYCIN HCL 300 MG PO CAPS: 300.0000 mg | ORAL_CAPSULE | Freq: Three times a day (TID) | ORAL | 0 refills | Status: DC

## 2018-09-10 MED ORDER — FLUCONAZOLE 200 MG PO TABS: 200.0000 mg | ORAL_TABLET | Freq: Every day | ORAL | 0 refills | Status: AC

## 2018-09-10 NOTE — Progress Notes (Signed)
[facility] SUR[facility]ICAL PROGRESS NOTE (cpt [phone])  Hospital Day(s): 5.   Post op day(s):  [name]   Interval History: Patient seen and examined, no acute events or new complaints overnight. Patient reports that his leg is feeling better this morning, with decreased warmth. No fevers overnight. No reports of chills, CP, SOB, nausea, or emesis.  No new labs were obtained today.  Review of Systems:  Constitutional: denies fever, chills   Respiratory: denies any shortness of breath  Cardiovascular: denies chest pain or palpitations  Gastrointestinal: denies N/V, or diarrhea/and  Integumentary: + LLE Cellulitis   Vital signs in last 24 hours: [min-max] current  Temp:  [97.6 F (36.4 C)-97.9 F (36.6 C)] 97.6 F (36.4 C) (04/19 0415) Pulse Rate:  [86-93] 93 (04/19 0415) Resp:  [18-28] 28 (04/19 0415) BP: (131-133)/(66-74) 131/66 (04/19 0415) SpO2:  [93 %-99 %] 93 % (04/19 0415)     Height: 5\' 9"  (175.3 cm) Weight: (!) 154.2 kg BMI (Calculated): 50.19   Intake/Output this shift:  No intake/output data recorded.     Physical Exam:  Constitutional: alert, cooperative and no distress  HENT: normocephalic without obvious abnormality  Eyes: PERRL, EOM's grossly intact and symmetric  Respiratory: breathing non-labored at rest  Cardiovascular: regular rate and sinus rhythm  Integumentary: erythema extending from the proximal medial left thigh extending to the left foot, erythema and edema worsen throughout the calf, no longer tender; swelling improved, no appreciable abscess or crepitus.  Inguinal fold is macerated, however patient states that nurses are applying gauze.  His leg is elevated appropriately today.   Labs:  CBC Latest Ref Rng & Units 09/08/2018 09/07/2018 09/06/2018  WBC 4.0 - 10.5 K/uL 10.0 10.7(H) 10.1  Hemoglobin 13.0 - 17.0 g/dL 12.9(L) 12.9(L) 13.6  Hematocrit 39.0 - 52.0 % 38.6(L) 38.5(L) 40.6  Platelets 150 - 400 K/uL 142(L) 120(L) 114(L)   CMP Latest Ref  Rng & Units 09/07/2018 09/06/2018 09/04/2018  Glucose 70 - 99 mg/dL 137(H) 151(H) 214(H)  BUN 8 - 23 mg/dL 16 20 22   Creatinine 0.61 - 1.24 mg/dL 0.88 0.97 1.27(H)  Sodium 135 - 145 mmol/L 132(L) 133(L) 131(L)  Potassium 3.5 - 5.1 mmol/L 3.8 4.0 4.0  Chloride 98 - 111 mmol/L 99 101 100  CO2 22 - 32 mmol/L 25 23 23   Calcium 8.9 - 10.3 mg/dL 8.0(L) 8.2(L) 8.4(L)  Total Protein 6.1 - 8.1 g/dL - - -  Total Bilirubin 0.2 - 1.2 mg/dL - - -  Alkaline Phos 38 - 126 U/L - - -  AST 10 - 35 U/L - - -  ALT 9 - 46 U/L - - -     Imaging studies: No new pertinent imaging studies   Assessment/Plan: (ICD-10's: L03.116) 63 y.o. male with resolved leukocytosis still with significant erythema to the left leg attributable to left lower leg cellulitis without palpable fluctuance or crepitus without soft tissue gas or fluid collections on repeat CT 4/16, 2 months s/p debridement for necrotizing soft tissue infection , and complicated by pertinent comorbidities including obesity and chronic ongoing tobacco abuse (smoking).   - No indications for emergent surgical management currently; will continue to closely monitor for signs of developing abscess or necrotizing infection  - Continue Abx/antifungal per infectious disease recommendations; appreciate ID help  - Please continue to place gauze in left thigh skin folds to reduce moisture  - Keep left leg elevated  at the level of the heart.  - Monitor leukocytosis  - Further management per primary  team.

## 2018-09-10 NOTE — Discharge Summary (Signed)
[place] at [place] NAME: Corey James    MR#:  [id number]  DATE OF BIRTH:  1956-02-18  DATE OF ADMISSION:  09/04/2018 ADMITTING PHYSICIAN: Harrie Foreman, MD  DATE OF DISCHARGE: 09/10/2018  PRIMARY CARE PHYSICIAN: [name], FNP    ADMISSION DIAGNOSIS:  Fever [R50.9] Cellulitis of left lower extremity [W73.710]  DISCHARGE DIAGNOSIS:  Active Problems:   Sepsis ([place])   SECONDARY DIAGNOSIS:   Past Medical History:  Diagnosis Date  . Back pain   . Cellulitis   . Disc degeneration, lumbosacral   . Hepatitis C     HOSPITAL COURSE:   63 year old male with history of obesity and debridement for necrotizing soft tissue infection who presented to the emergency room due to concern of left leg cellulitis.   1.  Sepsis with leukocytosis and fever on admission due to left leg cellulitis with previous left groin necrotizing soft tissue infection: Initial CT scan did not show evidence of abscess.  Lower extremity Doppler was negative.  Repeat Ct scan does not show soft tissue gas or fluid  He was evaluated by surgical services as well as infectious disease.  Cellulitis seems to have improved.  He has no evidence of necrotizing fasciitis.  He was on Rocephin and clindamycin as per recommendations by ID.  He will be discharged on oral Keflex, clindamycin as well as fluconazole for intertrigo.  He will follow-up with PCP in 3 days.  If needed he will need to follow-up with Dr. Ramon Dredge from infectious disease as well.  He needs to continue to elevate his legs on 4 pillows.  This was discussed with the patient.      2. Tobacco dependence: Patient is encouraged to quit smoking and willing to attempt to quit was assessed. Patient NOT highly motivated.Counseling was provided.  3.  Hyperlipidemia: Continue statin  4.  Hyponatremia: Sodium has been stable  5.  Thrombocytopenia: Platelets are stable  DISCHARGE CONDITIONS AND DIET:    Stable for discharge on regular diet  CONSULTS OBTAINED:  Treatment Team:  Olean Ree, MD Tsosie Billing, MD  DRUG ALLERGIES:   Allergies  Allergen Reactions  . Penicillins Itching and Rash    DISCHARGE MEDICATIONS:   Allergies as of 09/10/2018      Reactions   Penicillins Itching, Rash      Medication List    STOP taking these medications   sulfamethoxazole-trimethoprim 800-160 MG tablet Commonly known as:  BACTRIM DS     TAKE these medications   acetaminophen 500 MG tablet Commonly known as:  TYLENOL Take 1 tablet (500 mg total) by mouth every 6 (six) hours as needed for mild pain or fever.   buPROPion 150 MG 12 hr tablet Commonly known as:  Wellbutrin SR Take 1 tablet once daily x3 days, then take 1 tablet twice daily.   cephALEXin 500 MG capsule Commonly known as:  KEFLEX Take 1 capsule (500 mg total) by mouth 3 (three) times daily for 10 days.   clindamycin 300 MG capsule Commonly known as:  CLEOCIN Take 1 capsule (300 mg total) by mouth 3 (three) times daily for 10 days.   cyclobenzaprine 10 MG tablet Commonly known as:  FLEXERIL PLEASE SEE ATTACHED FOR DETAILED DIRECTIONS   docusate sodium 100 MG capsule Commonly known as:  COLACE Take 100 mg by mouth 2 (two) times daily.   fluconazole 200 MG tablet Commonly known as:  DIFLUCAN Take 1 tablet (200 mg total) by mouth  daily for 10 days.   HYDROcodone-acetaminophen 7.5-325 MG tablet Commonly known as:  NORCO Take 1 tablet by mouth every 4 (four) hours as needed.   rosuvastatin 20 MG tablet Commonly known as:  Crestor Take 1 tablet (20 mg total) by mouth daily.         Today   CHIEF COMPLAINT:   Wants to go home   VITAL SIGNS:  Blood pressure 131/66, pulse 93, temperature 97.6 F (36.4 C), temperature source Oral, resp. rate (!) 28, height 5\' 9"  (1.753 m), weight (!) 154.2 kg, SpO2 93 %.   REVIEW OF SYSTEMS:  Review of Systems  Constitutional: Negative.  Negative for  chills, fever and malaise/fatigue.  HENT: Negative.  Negative for ear discharge, ear pain, hearing loss, nosebleeds and sore throat.   Eyes: Negative.  Negative for blurred vision and pain.  Respiratory: Negative.  Negative for cough, hemoptysis, shortness of breath and wheezing.   Cardiovascular: Negative.  Negative for chest pain, palpitations and leg swelling.  Gastrointestinal: Negative.  Negative for abdominal pain, blood in stool, diarrhea, nausea and vomiting.  Genitourinary: Negative.  Negative for dysuria.  Musculoskeletal: Negative.  Negative for back pain.  Skin: Positive for rash.       Cellulitis left leg  Neurological: Negative for dizziness, tremors, speech change, focal weakness, seizures and headaches.  Endo/Heme/Allergies: Negative.  Does not bruise/bleed easily.  Psychiatric/Behavioral: Negative.  Negative for depression, hallucinations and suicidal ideas.     PHYSICAL EXAMINATION:  GENERAL:  63 y.o.-year-old patient lying in the bed with no acute distress.  NECK:  Supple, no jugular venous distention. No thyroid enlargement, no tenderness.  LUNGS: Normal breath sounds bilaterally, no wheezing, rales,rhonchi  No use of accessory muscles of respiration.  CARDIOVASCULAR: S1, S2 normal. No murmurs, rubs, or gallops.  ABDOMEN: Soft, non-tender, non-distended. Bowel sounds present. No organomegaly or mass.  EXTREMITIES: No pedal edema, cyanosis, or clubbing.  PSYCHIATRIC: The patient is alert and oriented x 3.  SKIN: erythema extending from the proximal medial left thigh extending to the left foot, erythema and edema has improved since admission, no appreciable abscess or crepitus.  Intertrigo  DATA REVIEW:   CBC Recent Labs  Lab 09/08/18 0615  WBC 10.0  HGB 12.9*  HCT 38.6*  PLT 142*    Chemistries  Recent Labs  Lab 09/07/18 0503  NA 132*  K 3.8  CL 99  CO2 25  GLUCOSE 137*  BUN 16  CREATININE 0.88  CALCIUM 8.0*    Cardiac Enzymes No results for  input(s): TROPONINI in the last 168 hours.  Microbiology Results  @MICRORSLT48 @  RADIOLOGY:  No results found.    Allergies as of 09/10/2018      Reactions   Penicillins Itching, Rash      Medication List    STOP taking these medications   sulfamethoxazole-trimethoprim 800-160 MG tablet Commonly known as:  BACTRIM DS     TAKE these medications   acetaminophen 500 MG tablet Commonly known as:  TYLENOL Take 1 tablet (500 mg total) by mouth every 6 (six) hours as needed for mild pain or fever.   buPROPion 150 MG 12 hr tablet Commonly known as:  Wellbutrin SR Take 1 tablet once daily x3 days, then take 1 tablet twice daily.   cephALEXin 500 MG capsule Commonly known as:  KEFLEX Take 1 capsule (500 mg total) by mouth 3 (three) times daily for 10 days.   clindamycin 300 MG capsule Commonly known as:  CLEOCIN Take 1  capsule (300 mg total) by mouth 3 (three) times daily for 10 days.   cyclobenzaprine 10 MG tablet Commonly known as:  FLEXERIL PLEASE SEE ATTACHED FOR DETAILED DIRECTIONS   docusate sodium 100 MG capsule Commonly known as:  COLACE Take 100 mg by mouth 2 (two) times daily.   fluconazole 200 MG tablet Commonly known as:  DIFLUCAN Take 1 tablet (200 mg total) by mouth daily for 10 days.   HYDROcodone-acetaminophen 7.5-325 MG tablet Commonly known as:  NORCO Take 1 tablet by mouth every 4 (four) hours as needed.   rosuvastatin 20 MG tablet Commonly known as:  Crestor Take 1 tablet (20 mg total) by mouth daily.          Management plans discussed with the patient and he is in agreement. Stable for discharge   Patient should follow up with pcp  CODE STATUS:     Code Status Orders  (From admission, onward)         Start     Ordered   09/05/18 0154  Full code  Continuous     09/05/18 0153        Code Status History    Date Active Date Inactive Code Status Order ID Comments User Context   07/06/2018 2132 07/15/2018 1737 Full Code [id number]   Saundra Shelling, MD Inpatient      TOTAL TIME TAKING CARE OF THIS PATIENT: 38 minutes.    Note: This dictation was prepared with Dragon dictation along with smaller phrase technology. Any transcriptional errors that result from this process are unintentional.  Bettey Costa M.D on 09/10/2018 at 9:19 AM  Between 7am to 6pm - Pager - [phone] After 6pm go to [email or web address] - password [email or web address] [facility]  Office  [phone]  CC: Primary care physician; [name], FNP

## 2018-09-10 NOTE — Discharge Planning (Signed)
All d/c paper work and instruction have been given to patient. No questions at this time

## 2018-09-10 NOTE — TOC Initial Note (Signed)
Transition of Care [facility]) - Initial/Assessment Note    Patient Details  Name: Corey James MRN: [id number] Date of Birth: 30-Jul-1955  [facility] CM/SW Contact:    Latanya Maudlin, RN Phone Number: 09/10/2018, 9:55 AM  Clinical Narrative: Patient to be discharged per MD order. Orders in place for home health services. Patient active with [facility]. Resumption orders in place, Lenora made aware. Will provide goodrx for dc abx as well as medication management information. Family to transport. No DME needs                    Expected Discharge Plan: [place] Barriers to Discharge: No Barriers Identified   Patient Goals and CMS Choice   CMS Medicare.gov Compare Post Acute Care list provided to:: Patient Choice offered to / list presented to : Patient  Expected Discharge Plan and Services Expected Discharge Plan: [place]   Discharge Planning Services: CM Consult, Medication Assistance   Living arrangements for the past 2 months: ([place] [facility] "persons" house) Expected Discharge Date: 09/10/18                   HH Arranged: RN [place] Agency: [place] ([place])  Prior Living Arrangements/Services Living arrangements for the past 2 months: ([place] at a "persons" house) Lives with:: Other (Comment)(would not disclose)              Current home services: Home RN    Activities of Daily Living Home Assistive Devices/Equipment: Environmental consultant (specify type), Wheelchair(front wheels) ADL Screening (condition at time of admission) Patient's cognitive ability adequate to safely complete daily activities?: Yes Is the patient deaf or have difficulty hearing?: No Does the patient have difficulty seeing, even when wearing glasses/contacts?: No Does the patient have difficulty concentrating, remembering, or making decisions?: No Patient able to express need for assistance with ADLs?: Yes Does the patient have difficulty  dressing or bathing?: No Independently performs ADLs?: Yes (appropriate for developmental age) Does the patient have difficulty walking or climbing stairs?: Yes Weakness of Legs: Both Weakness of Arms/Hands: None  Permission Sought/Granted                  Emotional Assessment   Attitude/Demeanor/Rapport: Guarded Affect (typically observed): Blunt        Admission diagnosis:  Fever [R50.9] Cellulitis of left lower extremity [L03.116] Patient Active Problem List   Diagnosis Date Noted  . Sepsis ([place]) 09/05/2018  . Chronic bilateral low back pain with right-sided sciatica 08/31/2018  . Class 3 severe obesity due to excess calories with serious comorbidity and body mass index (BMI) of 50.0 to 59.9 in adult ([place]) 08/31/2018  . Nocturia 08/31/2018  . Necrotizing soft tissue infection   . Abscess of left lower extremity   . Thigh abscess 07/06/2018  . Arthritis of hip 06/09/2018  . DDD (degenerative disc disease), lumbar 06/09/2018  . Morbid obesity with BMI of 45.0-49.9, adult ([place]) 06/09/2018   PCP:  [name], FNP Pharmacy:   [id number] - [place], [address] [place] [address] Phone: [phone] Fax: [phone]     Social Determinants of Health (SDOH) Interventions    Readmission Risk Interventions No flowsheet data found.

## 2018-09-11 ENCOUNTER — Telehealth: Payer: Self-pay | Admitting: Gastroenterology

## 2018-09-11 ENCOUNTER — Ambulatory Visit: Payer: Self-pay | Admitting: Surgery

## 2018-09-11 NOTE — Telephone Encounter (Signed)
Checking to see if you received the paperwork for his Hep C & when will he receive his meds.

## 2018-09-12 NOTE — Telephone Encounter (Signed)
Pt notified paperwork was received and faxed. Pt was approved for [place] through patient assistance program from [employer]. Medication being shipped today and will be delivered Thursday, April 23rd. Pt aware he will need a follow up with Dr. Allen Norris once treatment has been completed.

## 2018-09-13 ENCOUNTER — Telehealth: Payer: Self-pay

## 2018-09-13 NOTE — Telephone Encounter (Signed)
Transition Care Management Follow-up Telephone Call  Date of discharge and from where: 09/10/18 [facility]  How have you been since you were released from the hospital? Pt states he is still swollen and oozing from cellulitis of left lower extremity. Pt c/o 8 out of 10 pain.   Any questions or concerns? Yes  pt states he is unable to afford generic crestor that was recently sent in due to no insurance at this time. He is also not taking his bupropion due to cost as well.  Items Reviewed:  Did the pt receive and understand the discharge instructions provided? Yes   Medications obtained and verified? Yes   Any new allergies since your discharge? No   Dietary orders reviewed? Yes  Do you have support at home? Yes   Functional Questionnaire: (I = Independent and D = Dependent) ADLs: I  Bathing/Dressing- I  Meal Prep- I  Eating- I  Maintaining continence- I  Transferring/Ambulation- I  Managing Meds- I  Follow up appointments reviewed:   PCP Hospital f/u appt confirmed? Yes  Scheduled to see [name] NP via virtual visit  on 09/14/18 @ 8:00.  Are transportation arrangements needed? No   If their condition worsens, is the pt aware to call PCP or go to the Emergency Dept.? Yes  Was the patient provided with contact information for the PCP's office or ED? Yes  Was to pt encouraged to call back with questions or concerns? Yes

## 2018-09-14 ENCOUNTER — Other Ambulatory Visit: Payer: Self-pay

## 2018-09-14 ENCOUNTER — Telehealth: Payer: Self-pay | Admitting: Gastroenterology

## 2018-09-14 ENCOUNTER — Ambulatory Visit (INDEPENDENT_AMBULATORY_CARE_PROVIDER_SITE_OTHER): Payer: Self-pay | Admitting: Family Medicine

## 2018-09-14 ENCOUNTER — Encounter: Payer: Self-pay | Admitting: Family Medicine

## 2018-09-14 DIAGNOSIS — D72829 Elevated white blood cell count, unspecified: Secondary | ICD-10-CM

## 2018-09-14 DIAGNOSIS — R7309 Other abnormal glucose: Secondary | ICD-10-CM

## 2018-09-14 DIAGNOSIS — Z598 Other problems related to housing and economic circumstances: Secondary | ICD-10-CM

## 2018-09-14 DIAGNOSIS — Z09 Encounter for follow-up examination after completed treatment for conditions other than malignant neoplasm: Secondary | ICD-10-CM

## 2018-09-14 DIAGNOSIS — Z599 Problem related to housing and economic circumstances, unspecified: Secondary | ICD-10-CM

## 2018-09-14 DIAGNOSIS — L03116 Cellulitis of left lower limb: Secondary | ICD-10-CM

## 2018-09-14 MED ORDER — ATORVASTATIN CALCIUM 40 MG PO TABS: 40.0000 mg | ORAL_TABLET | Freq: Every day | ORAL | 1 refills | Status: DC

## 2018-09-14 MED ORDER — GLECAPREVIR-PIBRENTASVIR 100-40 MG PO TABS: 3.0000 | ORAL_TABLET | Freq: Every day | ORAL | 1 refills | Status: AC

## 2018-09-14 NOTE — Telephone Encounter (Signed)
Pt is calling to speak with Ginger he received his Hepatitis kit and needs Directions before taking it

## 2018-09-14 NOTE — Telephone Encounter (Signed)
Pt advised how to take the [place] medication.

## 2018-09-14 NOTE — Progress Notes (Signed)
Name: Corey James   MRN: [id number]    DOB: May 31, 1955   Date:09/15/2018       Progress Note  Subjective  Chief Complaint  Chief Complaint  Patient presents with  . Hospitalization Follow-up    I connected with  Corey James  on 09/15/18 at  8:00 AM EDT by a video enabled telemedicine application and verified that I am speaking with the correct person using two identifiers.  I discussed the limitations of evaluation and management by telemedicine and the availability of in person appointments. The patient expressed understanding and agreed to proceed. Staff also discussed with the patient that there may be a patient responsible charge related to this service. Patient Location: Home Provider Location: Home Additional Individuals present: Male Partner [facility]) assists with holding camera for evaluation.  HPI  Hospital discharge follow-up:   Left lower extremity cellulitis, sepsis with leukocytosis: Was admitted 09/04/2018, and discharged 09/10/2018.  He was evaluated by infectious disease and surgical while admitted.  Imaging of the area included 2 CT scans and one lower extremity Doppler both of which were negative for abscess, or concern for necrotizing fasciitis.  He had initial leukocytosis that was mild, and improved prior to discharge.  Also had ongoing mild hyponatremia, and hyperglycemia.  Wound culture showed few strep group C and few diphtheroids. He was discharged on oral Keflex & clindamycin, and was told to follow-up with PCP and possibly infectious disease.  Overall he feels his leg is looking and feeling much better, redness is improving, swelling is about the same.  Nurse is coming to the house weekly to evaluate, and he and his girlfriend are changing the dressing daily.  Has been compliant with his antibiotics. We will refer to ID today for follow up.  Transition of care call was reviewed, and cost of his medications was discussed as he is self-pay at this time.  I  recommend he utilize [facility], and/or use [facility].  I will refer to our chronic care management team for a social work consult to determine and assist with eligibility and application for these services.  Patient Active Problem List   Diagnosis Date Noted  . Sepsis ([place]) 09/05/2018  . Chronic bilateral low back pain with right-sided sciatica 08/31/2018  . Class 3 severe obesity due to excess calories with serious comorbidity and body mass index (BMI) of 50.0 to 59.9 in adult ([place]) 08/31/2018  . Nocturia 08/31/2018  . Necrotizing soft tissue infection   . Abscess of left lower extremity   . Thigh abscess 07/06/2018  . Arthritis of hip 06/09/2018  . DDD (degenerative disc disease), lumbar 06/09/2018  . Morbid obesity with BMI of 45.0-49.9, adult ([place]) 06/09/2018    Past Surgical History:  Procedure Laterality Date  . APPLICATION OF WOUND VAC Left 07/14/2018   Procedure: WOUND VAC CHANGE;  Surgeon: Olean Ree, MD;  Location: [facility] ORS;  Service: General;  Laterality: Left;  . INCISION AND DRAINAGE ABSCESS Left 07/11/2018   Procedure: INCISION AND DRAINAGE ABSCESS-LEFT THIGH;  Surgeon: [name], MD;  Location: [facility] ORS;  Service: General;  Laterality: Left;  . WOUND DEBRIDEMENT Left 07/14/2018   Procedure: POSSIBLE DEBRIDEMENT WOUND-LEFT THIGH;  Surgeon: Olean Ree, MD;  Location: [facility] ORS;  Service: General;  Laterality: Left;    Family History  Problem Relation Age of Onset  . Dementia Mother   . Alzheimer's disease Mother   . CVA Father   . Cancer - Colon Father   . Heart  attack Brother   . Hypertension Brother     Social History   Socioeconomic History  . Marital status: Widowed    Spouse name: Not on file  . Number of children: Not on file  . Years of education: Not on file  . Highest education level: High school graduate  Occupational History  . Occupation: Risk manager: [employer]  Social Needs  . Financial  resource strain: Not hard at all  . Food insecurity:    Worry: Never true    Inability: Never true  . Transportation needs:    Medical: No    Non-medical: No  Tobacco Use  . Smoking status: Former Smoker    Packs/day: 1.00    Years: 40.00    Pack years: 40.00    Types: Cigarettes    Start date: 09/06/2018  . Smokeless tobacco: Current User  . Tobacco comment: Wellbutrin  Substance and Sexual Activity  . Alcohol use: Yes    Comment: 1/5 of vodka per month   . Drug use: Yes    Types: Marijuana  . Sexual activity: Yes    Partners: Female    Birth control/protection: None  Lifestyle  . Physical activity:    Days per week: 2 days    Minutes per session: 10 min  . Stress: Not at all  Relationships  . Social connections:    Talks on phone: More than three times a week    Gets together: More than three times a week    Attends religious service: Never    Active member of club or organization: No    Attends meetings of clubs or organizations: Never    Relationship status: Widowed  . Intimate partner violence:    Fear of current or ex partner: No    Emotionally abused: No    Physically abused: No    Forced sexual activity: No  Other Topics Concern  . Not on file  Social History Narrative  . Not on file     Current Outpatient Medications:  .  acetaminophen (TYLENOL) 500 MG tablet, Take 1 tablet (500 mg total) by mouth every 6 (six) hours as needed for mild pain or fever., Disp: 30 tablet, Rfl: 0 .  cephALEXin (KEFLEX) 500 MG capsule, Take 1 capsule (500 mg total) by mouth 3 (three) times daily for 10 days., Disp: 30 capsule, Rfl: 0 .  clindamycin (CLEOCIN) 300 MG capsule, Take 1 capsule (300 mg total) by mouth 3 (three) times daily for 10 days., Disp: 30 capsule, Rfl: 0 .  cyclobenzaprine (FLEXERIL) 10 MG tablet, PLEASE SEE ATTACHED FOR DETAILED DIRECTIONS, Disp: , Rfl:  .  docusate sodium (COLACE) 100 MG capsule, Take 100 mg by mouth 2 (two) times daily., Disp: , Rfl:  .   fluconazole (DIFLUCAN) 200 MG tablet, Take 1 tablet (200 mg total) by mouth daily for 10 days., Disp: 10 tablet, Rfl: 0 .  gabapentin (NEURONTIN) 300 MG capsule, Take 300 mg by mouth 2 (two) times daily. Prescribed by Dr. [name], Disp: , Rfl:  .  HYDROcodone-acetaminophen (NORCO) 7.5-325 MG tablet, Take 1 tablet by mouth every 4 (four) hours as needed. , Disp: , Rfl:  .  atorvastatin (LIPITOR) 40 MG tablet, Take 1 tablet (40 mg total) by mouth daily., Disp: 90 tablet, Rfl: 1 .  buPROPion (WELLBUTRIN SR) 150 MG 12 hr tablet, Take 1 tablet once daily x3 days, then take 1 tablet twice daily. (Patient not taking: Reported  on 09/13/2018), Disp: 180 tablet, Rfl: 1 .  Glecaprevir-Pibrentasvir (MAVYRET) 100-40 MG TABS, Take 3 tablets by mouth daily with breakfast., Disp: 90 tablet, Rfl: 1  Allergies  Allergen Reactions  . Penicillins Itching and Rash    I personally reviewed active problem list, medication list, allergies, notes from last encounter, lab results with the patient/caregiver today.   ROS  Constitutional: Negative for fever or weight change.  Respiratory: Negative for cough and shortness of breath.   Cardiovascular: Negative for chest pain or palpitations.  Gastrointestinal: Negative for abdominal pain, no bowel changes.  Musculoskeletal: Negative for gait problem or joint swelling.  Skin: See above Neurological: Negative for dizziness or headache.  No other specific complaints in a complete review of systems (except as listed in HPI above).   Objective  Virtual encounter, vitals not obtained.  There is no height or weight on file to calculate BMI.  Physical Exam  Constitutional: Patient appears well-developed and well-nourished. No distress.  HENT: Head: Normocephalic and atraumatic.  Neck: Normal range of motion. Pulmonary/Chest: Effort normal. No respiratory distress. Speaking in complete sentences Neurological: Pt is alert and oriented to person, place, and time.  Coordination, speech and gait are normal.  Psychiatric: Patient has a normal mood and affect. behavior is normal. Judgment and thought content normal. Skin: Calf is non-tender but is swollen, and thigh is swollen as well.  Skin is erythematous, but patient states the area is lightening in color.  The shin and calf have peeling skin and clear fluid is present and scant.  Compared to the photos provided by Dr. Delaine Lame, the erythema has improved.  No results found for this or any previous visit (from the past 72 hour(s)).  PHQ2/9: Depression screen [facility] 2/9 09/14/2018 08/31/2018  Decreased Interest 0 1  Down, Depressed, Hopeless 0 0  PHQ - 2 Score 0 1  Altered sleeping 0 3  Tired, decreased energy 0 3  Change in appetite 0 2  Feeling bad or failure about yourself  0 0  Trouble concentrating 0 0  Moving slowly or fidgety/restless 0 0  Suicidal thoughts 0 0  PHQ-9 Score 0 9  Difficult doing work/chores Not difficult at all Somewhat difficult   PHQ-2/9 Result is positive.  Working on affordability for Wellbutrin  Fall Risk: Fall Risk  09/14/2018 08/31/2018 08/09/2018 07/26/2018  Falls in the past year? 0 0 0 0  Number falls in past yr: 0 0 0 -  Injury with Fall? 0 0 0 -  Follow up Falls evaluation completed Falls evaluation completed - -   Assessment & Plan  1. Hospital discharge follow-up - Doing well overall, course seems to be improving, though his leg is still impressively erythematous.  He will come in tomorrow between 8am-12pm for labs only,   2. Cellulitis of left leg - CBC with Differential/Platelet - BASIC METABOLIC PANEL WITH GFR - Ambulatory referral to Infectious Disease - Appears to be slightly improved from hospitalization, though he is now having skin peeling and clear drainage. Would appreciate consult from Dr. Delaine Lame with ID as she followed his case in hospital.  Advised to continue and complete Abx course and maintain home health for wound care/dressing changes.   3. Elevated glucose level - Hemoglobin O8C - BASIC METABOLIC PANEL WITH GFR  4. Leukocytosis, unspecified type - CBC with Differential/Platelet - Ambulatory referral to Infectious Disease  5. Financial difficulty - Ambulatory referral to Chronic Care Management Services  I discussed the assessment and treatment plan with the  patient. The patient was provided an opportunity to ask questions and all were answered. The patient agreed with the plan and demonstrated an understanding of the instructions.  The patient was advised to call back or seek an in-person evaluation if the symptoms worsen or if the condition fails to improve as anticipated.  I provided 45 minutes of non-face-to-face time during this encounter.

## 2018-09-15 ENCOUNTER — Ambulatory Visit: Payer: Self-pay

## 2018-09-15 DIAGNOSIS — Z598 Other problems related to housing and economic circumstances: Secondary | ICD-10-CM

## 2018-09-15 DIAGNOSIS — L03116 Cellulitis of left lower limb: Secondary | ICD-10-CM

## 2018-09-15 DIAGNOSIS — Z599 Problem related to housing and economic circumstances, unspecified: Secondary | ICD-10-CM

## 2018-09-15 NOTE — Chronic Care Management (AMB) (Signed)
  Care Management   Note  09/15/2018 Name: Corey James MRN: [id number] DOB: May 21, 1956  Corey James is a 63 year old malewho sees [name], FNP for primary care. Ms. [name] the CCM team to consult the patient for chronic care management and care coordination specifically related to his uninsured status and need for community resources. Referral was placed4/23/2020 during PCP visit for Hospital discharge follow up secondary to cellulitis of left lower leg.  SDOH (Social Determinants of Health) screening performed today. See Care Plan Entry related to challenges with: Financial Strain   Corey James was given information about Care Management services today including:  1. Case Management services include personalized support from designated clinical staff supervised by a physician, including individualized plan of care and coordination with other care providers 2. 24/7 contact phone numbers for assistance for urgent and routine care needs. 3. The patient may stop CCM services at any time (effective at the end of the month) by phone call to the office staff.   Patient agreed to services and verbal consent obtained.     Plan: Patient will be contacted by [place] next week    Teniqua Marron E. Rollene Rotunda, RN, BSN Nurse Care Coordinator [facility] / [facility] Care Management  [phone]

## 2018-09-15 NOTE — Patient Instructions (Signed)
1. Thank You for allowing the CCM (Chronic Care Management) Team to assist you with your healthcare goals!! We look forward to speaking with you next week. You will be contacted by [place]  2.  Contact the CCM Team if you have any question or need to reschedule your initial visit.  CCM (Chronic Care Management) Team   Trish Fountain RN, BSN Nurse Care Coordinator  [phone]  Ruben Reason PharmD  Clinical Pharmacist  [phone]   Elliot Gurney, LCSW Clinical Social Worker [phone]  Mr. Langlinais was given information about Care Management services today including:  1. Case Management services includes personalized support from designated clinical staff supervised by his physician, including individualized plan of care and coordination with other care providers 2. 24/7 contact phone numbers for assistance for urgent and routine care needs. 3. The patient may stop case management services at any time by phone call to the office staff.  Patient agreed to services and verbal consent obtained.

## 2018-09-15 NOTE — Chronic Care Management (AMB) (Signed)
    Care Management   Unsuccessful Call Note 09/15/2018 Name: Corey James MRN: [id number] DOB: [birth year]  [name] is a 63 year old male who sees [name], FNP for primary care. Ms. [name] asked the CCM team to consult the patient for chronic care management and care coordination specifically related to his uninsured status and need for community resources. Referral was placed 09/14/2018 during PCP visit for Hospital discharge follow up secondary to cellulitis of left lower leg.   Was unable to reach patient via telephone today for introduction of CCM Services. I have left HIPAA compliant voicemail asking patient to return my call. (unsuccessful outreach #1).   Plan: Will follow-up within 7 business days via telephone.     Shakesha Soltau E. Rollene Rotunda, RN, BSN Nurse Care Coordinator [facility] / [facility] Care Management  [phone]

## 2018-09-16 LAB — HEMOGLOBIN A1C
Hgb A1c MFr Bld: 5.3 % of total Hgb (ref <5.7)
Mean Plasma Glucose: 105 (calc)
eAG (mmol/L): 5.8 (calc)

## 2018-09-16 LAB — BASIC METABOLIC PANEL WITH GFR
BUN: 11 mg/dL (ref 7–25)
CO2: 28 mmol/L (ref 20–32)
Calcium: 8.5 mg/dL — ABNORMAL LOW (ref 8.6–10.3)
Chloride: 100 mmol/L (ref 98–110)
Creat: 0.9 mg/dL (ref 0.70–1.25)
GFR, Est African American: 106 mL/min/{1.73_m2} (ref >=60)
GFR, Est Non African American: 92 mL/min/{1.73_m2} (ref >=60)
Glucose, Bld: 99 mg/dL (ref 65–99)
Potassium: 4.8 mmol/L (ref 3.5–5.3)
Sodium: 135 mmol/L (ref 135–146)

## 2018-09-16 LAB — CBC WITH DIFFERENTIAL/PLATELET
Absolute Monocytes: 542 cells/uL (ref 200–950)
Basophils Absolute: 57 cells/uL (ref 0–200)
Basophils Relative: 0.6 %
Eosinophils Absolute: 162 cells/uL (ref 15–500)
Eosinophils Relative: 1.7 %
HCT: 39.1 % (ref 38.5–50.0)
Hemoglobin: 13.3 g/dL (ref 13.2–17.1)
Lymphs Abs: 1463 cells/uL (ref 850–3900)
MCH: 31.3 pg (ref 27.0–33.0)
MCHC: 34 g/dL (ref 32.0–36.0)
MCV: 92 fL (ref 80.0–100.0)
MPV: 9 fL (ref 7.5–12.5)
Monocytes Relative: 5.7 %
Neutro Abs: 7277 cells/uL (ref 1500–7800)
Neutrophils Relative %: 76.6 %
Platelets: 350 10*3/uL (ref 140–400)
RBC: 4.25 10*6/uL (ref 4.20–5.80)
RDW: 12.2 % (ref 11.0–15.0)
Total Lymphocyte: 15.4 %
WBC: 9.5 10*3/uL (ref 3.8–10.8)

## 2018-09-19 ENCOUNTER — Telehealth: Payer: Self-pay | Admitting: *Deleted

## 2018-09-19 ENCOUNTER — Telehealth: Payer: Self-pay | Admitting: Family Medicine

## 2018-09-19 NOTE — Telephone Encounter (Signed)
Patient calling to check the status on what [name] would like to do. States that his leg is still draining down into his flip flop. Please advise.

## 2018-09-19 NOTE — Telephone Encounter (Signed)
clindamycin (CLEOCIN) 300 MG capsule  Pt wants to know if he needs to continue this abx? Pt had hospital fup with Raquel Sarna 4/23 and does not remember what Raquel Sarna said about that.  Pt states he thinks she may need to look at his leg, and he thinks he needs to stay on abx a little while longer.    [id number] [address], [place] [phone] (Phone)  [phone] (Fax)    Pt also would like referral to a [facility] provider for his gastric bypass surgery. The other one referred to is not [facility] and they turned him down

## 2018-09-20 ENCOUNTER — Ambulatory Visit: Payer: Self-pay | Admitting: *Deleted

## 2018-09-20 ENCOUNTER — Encounter: Payer: Self-pay | Admitting: *Deleted

## 2018-09-20 ENCOUNTER — Telehealth: Payer: Self-pay

## 2018-09-20 ENCOUNTER — Other Ambulatory Visit: Payer: Self-pay

## 2018-09-20 ENCOUNTER — Telehealth: Payer: Self-pay | Admitting: *Deleted

## 2018-09-20 DIAGNOSIS — Z599 Problem related to housing and economic circumstances, unspecified: Secondary | ICD-10-CM

## 2018-09-20 DIAGNOSIS — Z598 Other problems related to housing and economic circumstances: Secondary | ICD-10-CM

## 2018-09-20 DIAGNOSIS — L03116 Cellulitis of left lower limb: Secondary | ICD-10-CM

## 2018-09-20 DIAGNOSIS — L03119 Cellulitis of unspecified part of limb: Principal | ICD-10-CM

## 2018-09-20 DIAGNOSIS — L02419 Cutaneous abscess of limb, unspecified: Secondary | ICD-10-CM

## 2018-09-20 MED ORDER — CLINDAMYCIN HCL 300 MG PO CAPS: 300.0000 mg | ORAL_CAPSULE | Freq: Three times a day (TID) | ORAL | 0 refills | Status: DC

## 2018-09-20 MED ORDER — CLINDAMYCIN HCL 300 MG PO CAPS: 300.0000 mg | ORAL_CAPSULE | Freq: Three times a day (TID) | ORAL | 0 refills | Status: AC

## 2018-09-20 NOTE — Chronic Care Management (AMB) (Signed)
  Chronic Care Management    Clinical Social Work General Note  09/20/2018 Name: Amariyon Maynes MRN: [id number] DOB: 1955/06/08  Reice Bienvenue is a 63 y.o. year old male who is a primary care patient of [name], FNP. The CCM was consulted to assist the patient with [name] related to financial strain.      SDOH (Social Determinants of Health) screening performed today. See Care Plan Entry related to challenges with: Financial Strain   Goals Addressed            This Visit's Progress   . "I do not have insurance to pay my hospital bills" (pt-stated)       This social worker spoke with patient today to discuss issues related financial strain. Per patient, he was laid off in February and now has no income or medical insurance. Per patient, he has a pending Medicaid and disability claim and relies on his girlfriends income to cover household expenses. Per patient, he is not able to pay his hospital bills from his recent hospital stay or cost of new medications that have just been ordered. Patient very frustrated over his financial situation and feels hopeless that anything can be done. Patient advised to call the [place] office to file for hardsdhip regarding recent medical bills as well as considering the Health and Wellness clinic for chronic disease management or [place] [phone].  Patient is not a candidate for the [facility] in [place] as he is a [facility] resident.    Current Barriers:  . Financial constraints . Medication procurement  Clinical Social Work Clinical Goal(s):  [name] Over the next 30 days, client will work with SW to address concerns related to filing for hardship related to medical bills and continued medical treatment   Interventions: . Patient interviewed and appropriate assessments performed . Provided patient with information about applying for hardship to assist with medical bills thorugh the  [place]. Also discussed possibility of referal to the Health and [facility] [phone] or the [facility] program [phone] . Discussed plans with patient for ongoing care management follow up and provided patient with direct contact information for care management team . Advised patient to contact the [facility] office [phone] to discuss financial hardship programs for unpaid medical bills  Patient Self Care Activities:  . Performs ADL's independently . Performs IADL's independently . Calls provider office for new concerns or questions  Initial goal documentation         Follow Up Plan: SW will follow up with patient by phone over the next 2 weeks       [place], [place] Worker  [facility] Care Management [phone]

## 2018-09-20 NOTE — Patient Instructions (Signed)
Thank you allowing the Chronic Care Management Team to be a part of your care! It was a pleasure speaking with you today!  1. Please contact the [facility] [phone] or the [facility] [phone]. 2. Please contact the [place] office to discuss hardship programs to address medical bills. 3. Please call this CCM social worker with any questions or concerns.      CCM (Chronic Care Management) Team   Trish Fountain RN, BSN Nurse Care Coordinator  [phone]  Ruben Reason PharmD  Clinical Pharmacist  [phone]   Elliot Gurney, LCSW Clinical Social Worker [phone]  Goals Addressed            This Visit's Progress   . "I do not have insurance to pay my hospital bills" (pt-stated)       Current Barriers:  . Financial constraints . Medication procurement  Clinical Social Work Clinical Goal(s):  [name] Over the next 30 days, client will work with SW to address concerns related to filing for hardship related to medical bills and continued medical treatment   Interventions: . Patient interviewed and appropriate assessments performed . Provided patient with information about applying for hardship to assist with medical bills thorugh the [place]. Also discussed possibility of referal to the Health and [facility] [phone] or the [facility] [phone] . Discussed plans with patient for ongoing care management follow up and provided patient with direct contact information for care management team . Advised patient to contact the [facility] office [phone] to discuss financial hardship programs for unpaid medical bills  Patient Self Care Activities:  . Performs ADL's independently . Performs IADL's independently . Calls provider office for new concerns or questions  Initial goal documentation         The patient verbalized understanding of  instructions provided today and declined a print copy of patient instruction materials.   The CM team will reach out to the patient again over the next 14 days days.

## 2018-09-20 NOTE — Progress Notes (Signed)
Referral to infectious disease and clindamycin refill per Dr.pabon at this time. Patient instructed to call office if he does not hear from infectious disease within 5-7 days.

## 2018-09-20 NOTE — Chronic Care Management (AMB) (Signed)
  Chronic Care Management   Social Work Note  09/20/2018 Name: [name] MRN: [id number] DOB: 1956-01-05  Corey James is a 63 y.o. year old male who sees [name], FNP for primary care. The CCM team was consulted for assistance with [name] due to financial strain.   Phone call to the [facility] and Wellness clinic to discuss referral. This social worker was informed that the clinic is not accepting any new patients until June 2020 due to COVID-19. Once seen, patient will be set up with financial services and given a orange/blue card. If patient needs to be seen by a specialist, he will be referred to one that accepts that card.  Goals Addressed   None     Follow Up Plan: SW will follow up with patient by phone over the next 2 weeks to discuss referral to the Health and Wellness clinic  [place], [place]  [facility] Care Management [phone]

## 2018-09-20 NOTE — Chronic Care Management (AMB) (Signed)
   Chronic Care Management   Unsuccessful Call Note 09/20/2018 Name: Corey James MRN: [id number] DOB: 1956-03-02  Corey James is a 63 year old male who sees [name] for primary care. [name] asked the CCM team to consult the patient for community resources related to financial strain. Referral was placed 09/14/18. Patient's last office visit was 09/14/18.     This CCM social worker was unable to reach patient via telephone today for initial screening. I have left HIPAA compliant voicemail asking patient to return my call. (unsuccessful outreach #1).   Plan: Will follow-up within 7 business days via telephone.     Elliot Gurney, [place] Administrator, [facility] Care Management [phone]

## 2018-09-20 NOTE — Telephone Encounter (Signed)
Patient states he feels he needs to stay on his clindamycin a while longer due to his wound is still seeping. He was discharged from hospital approximately 2 weeks ago. Message sent to Dr.pabon. Will call patient later once response from Dr.pabon is received.

## 2018-09-20 NOTE — Telephone Encounter (Signed)
Please advise 

## 2018-09-21 ENCOUNTER — Ambulatory Visit: Payer: Self-pay | Admitting: *Deleted

## 2018-09-21 DIAGNOSIS — Z599 Problem related to housing and economic circumstances, unspecified: Secondary | ICD-10-CM

## 2018-09-21 DIAGNOSIS — L03116 Cellulitis of left lower limb: Secondary | ICD-10-CM

## 2018-09-21 DIAGNOSIS — Z598 Other problems related to housing and economic circumstances: Secondary | ICD-10-CM

## 2018-09-21 NOTE — Telephone Encounter (Signed)
He needs to see infectious disease and call Dr. Corlis Leak office.  Please check on ID referral.

## 2018-09-21 NOTE — Patient Instructions (Signed)
Thank you allowing the Chronic Care Management Team to be a part of your care! It was a pleasure speaking with you today!  1. Please consider referral to the [facility] or the [employer] 2. Please continue to engage with this CCM social worker to continue to determine available community resources to address financial strain.     CCM (Chronic Care Management) Team   Trish Fountain RN, BSN Nurse Care Coordinator  [phone]  Ruben Reason PharmD  Clinical Pharmacist  [phone]   Elliot Gurney, LCSW Clinical Social Worker [phone]  Goals Addressed            This Visit's Progress   . "I do not have insurance to pay my hospital bills" (pt-stated)       Current Barriers:  . Financial constraints . Medication procurement  Clinical Social Work Clinical Goal(s):  [name] Over the next 30 days, client will work with SW to address concerns related to filing for hardship related to medical bills and continued medical treatment   Interventions: . This CCM social worker informed patient of contact made with the Department of Social Services to obtain the status of his Medicaid application. . This CCM social worker confirmed patient that as of 68/11, patient's application for Medicaid has been denied due to the following: . Patient is not 64 years old, blind/disabled, or on social security. . Patient advised of referral to either the [facility] [phone] or the [place] [phone] for further follow up. . Patient discussed plan to re-apply for Medicaid in May when he receives his social security   Patient Self Care Activities:  . Performs ADL's independently . Performs IADL's independently . Calls provider office for new concerns or questions  Please see past updates related to this goal by clicking on the "Past Updates" button in the selected goal          The patient verbalized understanding of  instructions provided today and declined a print copy of patient instruction materials.   The CM team will reach out to the patient again over the next 14 days.

## 2018-09-21 NOTE — Chronic Care Management (AMB) (Signed)
  Chronic Care Management   Social Work Note  09/21/2018 Name: Corey James MRN: [id number] DOB: [birth year]  [name] is a 63 y.o. year old male who sees [name], FNP for primary care. The CCM team was consulted for assistance with [name] related to financial strain.   Goals Addressed            This Visit's Progress   . "I do not have insurance to pay my hospital bills" (pt-stated)       Current Barriers:  . Financial constraints . Medication procurement  Clinical Social Work Clinical Goal(s):  [name] Over the next 30 days, client will work with SW to address concerns related to filing for hardship related to medical bills and continued medical treatment   Interventions: . This CCM social worker contacted the [place] to obtain the status of patient's Medicaid application. . This CCM social worker confirmed that as of 67/12, patient's application for Medicaid has been denied due to the following: . Patient is not 63 years old, blind/disabled, or on social security.  Patient Self Care Activities:  . Performs ADL's independently . Performs IADL's independently . Calls provider office for new concerns or questions  Please see past updates related to this goal by clicking on the "Past Updates" button in the selected goal         Follow Up Plan: SW will follow up with patient by phone over the next 2 weeks  [name], [place] Worker  [facility] Care Management [phone]

## 2018-09-21 NOTE — Patient Instructions (Signed)
Thank you allowing the Chronic Care Management Team to be a part of your care! It was a pleasure speaking with you today!  1. Please continue to engage with this CCM social worker to assist you with community resources related to your medical and financial needs.  CCM (Chronic Care Management) Team   Trish Fountain RN, BSN Nurse Care Coordinator  [phone]  Ruben Reason PharmD  Clinical Pharmacist  [phone]   Elliot Gurney, LCSW Clinical Social Worker [phone]  Goals Addressed            This Visit's Progress   . "I do not have insurance to pay my hospital bills" (pt-stated)       Current Barriers:  . Financial constraints . Medication procurement  Clinical Social Work Clinical Goal(s):  [name] Over the next 30 days, client will work with SW to address concerns related to filing for hardship related to medical bills and continued medical treatment   Interventions: . Patient interviewed and appropriate assessments performed . Patient advised this social worker that he made contactwith the [employer] office [phone] to discuss financial hardship programs for unpaid medical bills . Patient stated that he was told that his medical bills will be placed on hold until his Medicaid application is approved . This CCM social worker agreed to contact the [place] to obtain the status of patient's Medicaid application.  Patient Self Care Activities:  . Performs ADL's independently . Performs IADL's independently . Calls provider office for new concerns or questions  Please see past updates related to this goal by clicking on the "Past Updates" button in the selected goal          The patient verbalized understanding of instructions provided today and declined a print copy of patient instruction materials.   The CM team will reach out to the patient again over the next 2 weeks days.

## 2018-09-21 NOTE — Chronic Care Management (AMB) (Signed)
  Chronic Care Management    Clinical Social Work General Follow Up Note  09/21/2018 Name: Corey James MRN: [id number] DOB: 1955-06-24  Corey James is a 63 y.o. year old male who is a primary care patient of [name], FNP. The CCM team was consulted for assistance with Community Resources to assist with financial strain.   Review of patient status, including review of consultants reports, relevant laboratory and other test results, and collaboration with appropriate care team members and the patient's provider was performed as part of comprehensive patient evaluation and provision of chronic care management services.    Goals Addressed            This Visit's Progress   . "I do not have insurance to pay my hospital bills" (pt-stated)       Phone call from patient today to discuss outcome of phone call with the Business office at [facility]. Per patient, he was told that his medical bills were placed on hold until his Medicaid is approved. Per patient, he has applied for medicaid but was never informed of the outcome. This Education officer, museum agreed to follow up with the status of his Medicaid application.   Current Barriers:  . Financial constraints . Medication procurement  Clinical Social Work Clinical Goal(s):  [name] Over the next 30 days, client will work with SW to address concerns related to filing for hardship related to medical bills and continued medical treatment   Interventions: . Patient interviewed and appropriate assessments performed . Patient advised this social worker that he made contactwith the [employer] office [phone] to discuss financial hardship programs for unpaid medical bills . Patient stated that he was told that his medical bills will be placed on hold until his Medicaid application is approved . This CCM social worker agreed to contact the [place] to obtain the status of patient's Medicaid application.   Patient Self Care Activities:  . Performs ADL's independently . Performs IADL's independently . Calls provider office for new concerns or questions  Please see past updates related to this goal by clicking on the "Past Updates" button in the selected goal           Follow Up Plan: SW will follow up with patient by phone over the next 2 weeks regarding status of medicaid application    [place], [place] Worker  [facility] Care Management [phone]

## 2018-09-21 NOTE — Chronic Care Management (AMB) (Addendum)
Chronic Care Management    Clinical Social Work General Follow Up Note  09/21/2018 Name: Owain Eckerman MRN: [id number] DOB: Oct 08, 1955  Micheal Murad is a 63 y.o. year old male who is a primary care patient of [name], FNP. The CCM team was consulted for assistance with [name] related to financial strain.   Review of patient status, including review of consultants reports, relevant laboratory and other test results, and collaboration with appropriate care team members and the patient's provider was performed as part of comprehensive patient evaluation and provision of chronic care management services.    Goals Addressed            This Visit's Progress   . "I do not have insurance to pay my hospital bills" (pt-stated)       Phone call to patient to discuss information obtained regarding patient's Medicaid. Referral to the [facility] or the [facility] clinic for ongoing medical care discussed. Patient cannot be seen at the [facility] due to the fact that he is a [facility] resident. However, due to COVID-19, patient would not be able to be seen until June. Per patient, he plans to re-apply for Medicaid in May.  He plans to receive Social Security by that time, which is one of the eligibility requirements  Patient discussed having cellulitis of the left leg. Patient described that the leg is red, scabbing and pus filled. Per patient , he has been prescribed antibiotics and was seen by the [facility] nurse yesterday. Due to the [facility]'s inability to see patient until June due to COVID-19, patient to be referred to the [facility] RNCM for chronic care and disease management until which time he can be seen at the [facility] clinic.  Patient further discussed receiving a letter for 100% charity for gastric bypass surgery and additional medical procedures that was unclear to this social worker at [facility].    Current Barriers:  .  Financial constraints . Medication procurement  Clinical Social Work Clinical Goal(s):  [name] Over the next 30 days, client will work with SW to address concerns related to filing for hardship related to medical bills and continued medical treatment   Interventions: . This CCM social worker informed patient of contact made with the Department of Social Services to obtain the status of his Medicaid application. . This CCM social worker confirmed patient that as of 19/37, patient's application for Medicaid has been denied due to the following: . Patient is not 63 years old, blind/disabled, or on social security. . Patient advised of referral to either the [facility] and [facility] [phone] or the [place] [phone] for further follow up. . Patient discussed plan to re-apply for Medicaid in May when he receives his social security    Patient Self Care Activities:  . Performs ADL's independently . Performs IADL's independently . Calls provider office for new concerns or questions  Please see past updates related to this goal by clicking on the "Past Updates" button in the selected goal           Follow Up Plan: SW will follow up with patient by phone over the next 2 weeks regarding follow up plan for ongoing medical care   Due to the [place] inability to see patient until June due to COVID-19, patient to be referred to the [facility] RNCM for chronic care and disease management until which time he can be seen  at the [facility] if desired.      Elliot Gurney, [place] Administrator, [facility] [phone]

## 2018-09-21 NOTE — Telephone Encounter (Signed)
Spoke to [place] at referral office she said referral has been sent but no appointment has been schedule

## 2018-09-22 ENCOUNTER — Telehealth: Payer: Self-pay

## 2018-09-22 NOTE — Telephone Encounter (Signed)
Patient states he has some questions. He wanted to know if the white colored skin was normal prior to the scab forming. His home health care nurse looked at said it was part of the healing process. Patient denies fever, chills. No redness and states his leg is improving. He is taking his antibiotics. He was instructed to call office with any questions or concerns he may have. He has an appointment 10/09/2018.

## 2018-09-25 ENCOUNTER — Encounter: Payer: Self-pay | Admitting: Surgery

## 2018-09-25 ENCOUNTER — Ambulatory Visit (INDEPENDENT_AMBULATORY_CARE_PROVIDER_SITE_OTHER): Payer: Medicaid Other | Admitting: Surgery

## 2018-09-25 ENCOUNTER — Other Ambulatory Visit: Payer: Self-pay

## 2018-09-25 VITALS — BP 171/102 | HR 120 | Temp 97.9°F | Ht 69.0 in | Wt 328.0 lb

## 2018-09-25 DIAGNOSIS — I872 Venous insufficiency (chronic) (peripheral): Secondary | ICD-10-CM | POA: Diagnosis not present

## 2018-09-25 DIAGNOSIS — L02416 Cutaneous abscess of left lower limb: Secondary | ICD-10-CM

## 2018-09-25 DIAGNOSIS — L97221 Non-pressure chronic ulcer of left calf limited to breakdown of skin: Secondary | ICD-10-CM | POA: Diagnosis not present

## 2018-09-25 MED ORDER — OXYCODONE-ACETAMINOPHEN 7.5-325 MG PO TABS: 1.0000 | ORAL_TABLET | Freq: Three times a day (TID) | ORAL | 0 refills | Status: DC | PRN

## 2018-09-25 NOTE — Patient Instructions (Signed)
Return as scheduled 

## 2018-09-25 NOTE — Progress Notes (Signed)
Outpatient Surgical Follow Up  09/25/2018  Corey James is an 63 y.o. male.   Chief Complaint  Patient presents with  . Other    HPI: 61 use history cellulitis of necrotizing soft tissue infection of left thigh recently readmitted for cellulitis.  He has improved significantly but not developed superficial ulcerations to the left lower leg.  No fevers no chills.  He is taking antibiotics.  Continues to smoke daily.  He also reports significant pain that is sharp intermittent moderate intensity.  He has applied a self remedy consistent with alloe vera.    Past Medical History:  Diagnosis Date  . Back pain   . Cellulitis   . Disc degeneration, lumbosacral   . Hepatitis C     Past Surgical History:  Procedure Laterality Date  . APPLICATION OF WOUND VAC Left 07/14/2018   Procedure: WOUND VAC CHANGE;  Surgeon: Olean Ree, MD;  Location: [facility] ORS;  Service: General;  Laterality: Left;  . INCISION AND DRAINAGE ABSCESS Left 07/11/2018   Procedure: INCISION AND DRAINAGE ABSCESS-LEFT THIGH;  Surgeon: [name], MD;  Location: [facility] ORS;  Service: General;  Laterality: Left;  . WOUND DEBRIDEMENT Left 07/14/2018   Procedure: POSSIBLE DEBRIDEMENT WOUND-LEFT THIGH;  Surgeon: Olean Ree, MD;  Location: [facility] ORS;  Service: General;  Laterality: Left;    Family History  Problem Relation Age of Onset  . Dementia Mother   . Alzheimer's disease Mother   . CVA Father   . Cancer - Colon Father   . Heart attack Brother   . Hypertension Brother     Social History:  reports that he has quit smoking. His smoking use included cigarettes. He started smoking about 2 weeks ago. He has a 40.00 pack-year smoking history. He uses smokeless tobacco. He reports current alcohol use. He reports current drug use. Drug: Marijuana.  Allergies:  Allergies  Allergen Reactions  . Penicillins Itching and Rash    Medications reviewed.    ROS Full ROS performed and is otherwise negative other than what  is stated in HPI   BP (!) 171/102   Pulse (!) 120   Temp 97.9 F (36.6 C) (Skin)   Ht 5\' 9"  (1.753 m)   Wt (!) 328 lb (148.8 kg)   SpO2 96%   BMI 48.44 kg/m   Physical Exam Vitals signs and nursing note reviewed. Exam conducted with a chaperone present.  Constitutional:      Appearance: Normal appearance. He is obese.  Pulmonary:     Effort: Pulmonary effort is normal. No respiratory distress.     Breath sounds: No stridor.  Abdominal:     General: Abdomen is flat. There is no distension.     Palpations: There is no mass.     Tenderness: There is no abdominal tenderness. There is no guarding.     Hernia: No hernia is present.  Musculoskeletal:     Comments: There is now multiple superficial ulceration on lateral and medial aspects of left leg c./w venous stasis ulcers. He applied Aloe vera and I remove the dressing and placed a new Unna boot after elevating his leg. No evidence of necrotizing infection or abscess. As far a his thigh wound is almost completely healed. No abscess or cellulitis.  Skin:    General: Skin is warm and dry.     Capillary Refill: Capillary refill takes less than 2 seconds.  Neurological:     General: No focal deficit present.     Mental Status:  He is alert and oriented to person, place, and time.  Psychiatric:        Mood and Affect: Mood normal.        Behavior: Behavior normal.        Thought Content: Thought content normal.        Judgment: Judgment normal.       Assessment/Plan: Cellulitis and venous stasis ulcer left lower extremity. Unna Boot placed after elevating his Left leg.  Antibiotic therapy I d/w him that I would provide only one time script of percocet # 12  no refills. He needs to either find a pain specialist or talk to PCP about further pain needs. No surgical issues at this time Refer to wound clinic for weekly Unna boot placement   Greater than 50% of the 20 minutes  visit was spent in counseling/coordination of  care   Caroleen Hamman, MD [place] Surgeon

## 2018-09-27 ENCOUNTER — Telehealth: Payer: Self-pay

## 2018-09-27 NOTE — Telephone Encounter (Signed)
Copied from [place] [phone]. Topic: Quick Communication - Rx Refill/Question >> Sep 19, 2018  9:20 AM Scherrie Gerlach wrote: Medication:  clindamycin (CLEOCIN) 300 MG capsule Pt wants to know if he needs to continue this abx? Pt had hospital fup with Raquel Sarna 4/23 and does not remember what Raquel Sarna said about that. Pt states he thinks she may need to look at his leg, and he thinks he needs to stay on abx a little while longer.  [id number] [address], [place] [phone] (Phone) [phone] (Fax)  Pt also would like referral to a [facility] provider for his gastric bypass surgery.  The other one referred to is not [facility] and they turned him down

## 2018-09-28 ENCOUNTER — Telehealth: Payer: Self-pay | Admitting: *Deleted

## 2018-09-28 NOTE — Telephone Encounter (Signed)
With his current state for his legs, no one will be able to see him for consideration of bypass surgery.  We need to wait until his legs heal for referral.

## 2018-09-28 NOTE — Telephone Encounter (Signed)
Pt.notified

## 2018-09-28 NOTE — Telephone Encounter (Signed)
1. Dr. Dahlia Byes is managing the infection of the leg currently - per his last note on 09/25/2018 no additional antibiotics at this time. MUST keep follow up with Dr. Dahlia Byes. 2. I see in Dr. Corlis Leak notes that patient has been requesting pain medication.  Please let him know that I'm happy to refer to a pain specialist, but I will not be prescribing any hydrocodone.  He may take Tylenol or ibuprofen PRN for pain, and save the hydrocodone that Dr. Dahlia Byes provided for severe pain >7/10 on the pain scale.

## 2018-09-28 NOTE — Telephone Encounter (Signed)
Pt notified, wants referral to [facility] for lap band surgery? States you all had already discussed

## 2018-09-29 ENCOUNTER — Telehealth: Payer: Self-pay

## 2018-09-29 ENCOUNTER — Ambulatory Visit: Payer: Self-pay

## 2018-09-29 DIAGNOSIS — Z6841 Body Mass Index (BMI) 40.0 and over, adult: Secondary | ICD-10-CM

## 2018-09-29 DIAGNOSIS — E782 Mixed hyperlipidemia: Secondary | ICD-10-CM

## 2018-09-29 DIAGNOSIS — L03116 Cellulitis of left lower limb: Secondary | ICD-10-CM

## 2018-09-29 NOTE — Chronic Care Management (AMB) (Signed)
   Care Management   Unsuccessful Call Note 09/29/2018 Name: Corey James MRN: [id number] DOB: 1955/08/02  Corey James is a 63 year old male who sees [name], FNP for primary care. Ms. [name] asked the CCM team to consult the patient for financial and community resources and has been engaged with CCM LCSW [name]. Ms [name] requested CCM RN CM contact patient secondary to his ongoing leg infection for possible education and care coordination. Patient's last office visit was 09/14/2018 after hospitalization for cellulitis of left lower leg.     Was unable to reach patient via telephone. I have left HIPAA compliant voicemail asking patient to return my call. (unsuccessful outreach #1).   Plan: Will follow-up within 7 business days via telephone.      Isa Kohlenberg E. Rollene Rotunda, RN, BSN Nurse Care Coordinator [facility] Care Management  [phone]

## 2018-10-02 ENCOUNTER — Telehealth: Payer: Self-pay | Admitting: Licensed Clinical Social Worker

## 2018-10-02 ENCOUNTER — Telehealth: Payer: Self-pay | Admitting: Gastroenterology

## 2018-10-02 ENCOUNTER — Ambulatory Visit: Payer: Self-pay | Admitting: *Deleted

## 2018-10-02 ENCOUNTER — Ambulatory Visit: Payer: Self-pay | Admitting: Physician Assistant

## 2018-10-02 DIAGNOSIS — Z598 Other problems related to housing and economic circumstances: Secondary | ICD-10-CM

## 2018-10-02 DIAGNOSIS — L03116 Cellulitis of left lower limb: Secondary | ICD-10-CM

## 2018-10-02 DIAGNOSIS — Z599 Problem related to housing and economic circumstances, unspecified: Secondary | ICD-10-CM

## 2018-10-02 NOTE — Telephone Encounter (Signed)
Pt will need to contact the billing office. I do not handle this.

## 2018-10-02 NOTE — Telephone Encounter (Signed)
Received a referral for patient to be seen for leg abscess. I called patient and left a message for him to call and schedule an appointment.

## 2018-10-02 NOTE — Patient Instructions (Signed)
Thank you allowing the Chronic Care Management Team to be a part of your care! It was a pleasure speaking with you today!  1. Please follow up with the pain clinic on 10/03/18 2. Please continue to follow up with Medicaid application process 3. Please call this CCM social worker if you have any additional questions or concerns.       CCM (Chronic Care Management) Team   Trish Fountain RN, BSN Nurse Care Coordinator  [phone]  Ruben Reason PharmD  Clinical Pharmacist  [phone]   Elliot Gurney, LCSW Clinical Social Worker [phone]  Goals Addressed            This Visit's Progress   . "I do not have insurance to pay my hospital bills" (pt-stated)       Current Barriers:  . Financial constraints . Medication procurement  Clinical Social Work Clinical Goal(s):  [name] Over the next 30 days, client will work with SW to address concerns related to filing for hardship related to medical bills and continued medical treatment   Interventions:  . Patient  referral to either the [facility] [phone] or the [place] [phone] discussed for further follow up. . Patient discussed being in the process re-applying for Medicaid as he is now receiving social security with plans to continue to follow up with current primary care provider. . Patient discussed sleeping through his appointment today with the wound care clinic. Patient discussed appointment was re-scheduled for 10/03/18. Patient advised to keep appointment for 10/03/18. [name] Patient discussed continued plan to pursue Gastric Bypass Surgery at [facility] and is in the process of scheduling the initial virtual appointment    Patient Self Care Activities:  . Performs ADL's independently . Performs IADL's independently . Calls provider office for new concerns or questions  Please see past updates related to this goal by clicking on the "Past Updates" button in the selected goal           The patient verbalized understanding of instructions provided today and declined a print copy of patient instruction materials.   No further follow up required: Patient to call this social worker with any additional community resource needs

## 2018-10-02 NOTE — Telephone Encounter (Signed)
Pt left vm he needs a  itemized bill so he can get his Medicaid going and pay [place] he would like a call from [place] regarding this

## 2018-10-02 NOTE — Chronic Care Management (AMB) (Signed)
   Care Management    Clinical Social Work Follow Up Note  10/02/2018 Name: Corey James MRN: [id number] DOB: 11/07/1955  Corey James is a 63 y.o. year old male who is a primary care patient of [name], FNP. The CCM team was consulted for assistance due to  financial strain.  Review of patient status, including review of consultants reports, other relevant assessments, and collaboration with appropriate care team members and the patient's provider was performed as part of comprehensive patient evaluation and provision of chronic care management services.     Goals Addressed            This Visit's Progress   . "I do not have insurance to pay my hospital bills" (pt-stated)       Phone call to patient today to follow up on his plan for ongoing plan for medical care. Patient declines referral to the [facility] or the [facility] stating that he is in the process of re-applying for Medicaid and due to now receiving social security feels that he will be approved. Patient also receives food stamps. Patient discussed having a cellulitis flare up and reports missing his appointment with the pain clinic today. Per patient, the appointment has been re-scheduled for 10/03/18.   Current Barriers:  . Financial constraints . Medication procurement  Clinical Social Work Clinical Goal(s):  [name] Over the next 30 days, client will work with SW to address concerns related to filing for hardship related to medical bills and continued medical treatment   Interventions:  . Patient  referral to either the [facility] and [facility] [phone] or the [facility] [phone] discussed for further follow up. . Patient discussed being in the process re-applying for Medicaid as he is now receiving social security with plans to continue to follow up with current primary care provider. . Patient discussed sleeping through his appointment today with the wound care clinic.  Patient discussed appointment was re-scheduled for 10/03/18. Patient advised to keep appointment for 10/03/18. [name] Patient discussed continued plan to pursue Gastric Bypass Surgery at [facility] and is in the process of scheduling the initial virtual appointment    Patient Self Care Activities:  . Performs ADL's independently . Performs IADL's independently . Calls provider office for new concerns or questions  Please see past updates related to this goal by clicking on the "Past Updates" button in the selected goal          Follow Up Plan:  Patient will continue process of re-applying for  Medicaid.   Patient will attend appointment scheduled for    10/03/18 with the pain clinic.  Patient will call this social worker with any  assistance needs.   Elliot Gurney, [place] [facility] Care Management [phone]

## 2018-10-03 ENCOUNTER — Other Ambulatory Visit: Payer: Self-pay

## 2018-10-03 ENCOUNTER — Emergency Department: Payer: Medicaid Other

## 2018-10-03 ENCOUNTER — Inpatient Hospital Stay
Admission: EM | Admit: 2018-10-03 | Discharge: 2018-10-06 | DRG: 603 | Disposition: A | Discharge status: Home Health | Payer: Medicaid Other | Attending: Internal Medicine | Admitting: Internal Medicine

## 2018-10-03 ENCOUNTER — Encounter: Payer: Medicaid Other | Attending: Physician Assistant | Admitting: Physician Assistant

## 2018-10-03 DIAGNOSIS — M5137 Other intervertebral disc degeneration, lumbosacral region: Secondary | ICD-10-CM | POA: Diagnosis present

## 2018-10-03 DIAGNOSIS — M79605 Pain in left leg: Secondary | ICD-10-CM | POA: Diagnosis not present

## 2018-10-03 DIAGNOSIS — F17210 Nicotine dependence, cigarettes, uncomplicated: Secondary | ICD-10-CM | POA: Diagnosis present

## 2018-10-03 DIAGNOSIS — L03116 Cellulitis of left lower limb: Secondary | ICD-10-CM | POA: Insufficient documentation

## 2018-10-03 DIAGNOSIS — L97819 Non-pressure chronic ulcer of other part of right lower leg with unspecified severity: Secondary | ICD-10-CM | POA: Diagnosis present

## 2018-10-03 DIAGNOSIS — T8189XA Other complications of procedures, not elsewhere classified, initial encounter: Secondary | ICD-10-CM | POA: Insufficient documentation

## 2018-10-03 DIAGNOSIS — I872 Venous insufficiency (chronic) (peripheral): Secondary | ICD-10-CM | POA: Diagnosis present

## 2018-10-03 DIAGNOSIS — E669 Obesity, unspecified: Secondary | ICD-10-CM | POA: Insufficient documentation

## 2018-10-03 DIAGNOSIS — G894 Chronic pain syndrome: Secondary | ICD-10-CM | POA: Diagnosis present

## 2018-10-03 DIAGNOSIS — M5430 Sciatica, unspecified side: Secondary | ICD-10-CM | POA: Diagnosis present

## 2018-10-03 DIAGNOSIS — L97829 Non-pressure chronic ulcer of other part of left lower leg with unspecified severity: Secondary | ICD-10-CM | POA: Diagnosis present

## 2018-10-03 DIAGNOSIS — L98492 Non-pressure chronic ulcer of skin of other sites with fat layer exposed: Secondary | ICD-10-CM | POA: Diagnosis not present

## 2018-10-03 DIAGNOSIS — Z880 Allergy status to penicillin: Secondary | ICD-10-CM | POA: Diagnosis not present

## 2018-10-03 DIAGNOSIS — Z800 Family history of malignant neoplasm of digestive organs: Secondary | ICD-10-CM

## 2018-10-03 DIAGNOSIS — Z79899 Other long term (current) drug therapy: Secondary | ICD-10-CM | POA: Diagnosis not present

## 2018-10-03 DIAGNOSIS — L97822 Non-pressure chronic ulcer of other part of left lower leg with fat layer exposed: Secondary | ICD-10-CM | POA: Diagnosis not present

## 2018-10-03 DIAGNOSIS — F17200 Nicotine dependence, unspecified, uncomplicated: Secondary | ICD-10-CM | POA: Insufficient documentation

## 2018-10-03 DIAGNOSIS — Z1159 Encounter for screening for other viral diseases: Secondary | ICD-10-CM | POA: Diagnosis not present

## 2018-10-03 DIAGNOSIS — Z6841 Body Mass Index (BMI) 40.0 and over, adult: Secondary | ICD-10-CM | POA: Insufficient documentation

## 2018-10-03 DIAGNOSIS — X58XXXA Exposure to other specified factors, initial encounter: Secondary | ICD-10-CM | POA: Insufficient documentation

## 2018-10-03 DIAGNOSIS — L0390 Cellulitis, unspecified: Secondary | ICD-10-CM | POA: Diagnosis present

## 2018-10-03 DIAGNOSIS — I10 Essential (primary) hypertension: Secondary | ICD-10-CM | POA: Insufficient documentation

## 2018-10-03 DIAGNOSIS — Y838 Other surgical procedures as the cause of abnormal reaction of the patient, or of later complication, without mention of misadventure at the time of the procedure: Secondary | ICD-10-CM | POA: Insufficient documentation

## 2018-10-03 DIAGNOSIS — M1610 Unilateral primary osteoarthritis, unspecified hip: Secondary | ICD-10-CM | POA: Diagnosis present

## 2018-10-03 DIAGNOSIS — Z1629 Resistance to other single specified antibiotic: Secondary | ICD-10-CM | POA: Diagnosis present

## 2018-10-03 DIAGNOSIS — Z03818 Encounter for observation for suspected exposure to other biological agents ruled out: Secondary | ICD-10-CM | POA: Diagnosis not present

## 2018-10-03 DIAGNOSIS — B1920 Unspecified viral hepatitis C without hepatic coma: Secondary | ICD-10-CM | POA: Diagnosis present

## 2018-10-03 DIAGNOSIS — I890 Lymphedema, not elsewhere classified: Secondary | ICD-10-CM | POA: Diagnosis present

## 2018-10-03 DIAGNOSIS — L089 Local infection of the skin and subcutaneous tissue, unspecified: Secondary | ICD-10-CM | POA: Diagnosis present

## 2018-10-03 LAB — CBC WITH DIFFERENTIAL/PLATELET
Abs Immature Granulocytes: 0.11 10*3/uL — ABNORMAL HIGH (ref 0.00–0.07)
Basophils Absolute: 0.1 10*3/uL (ref 0.0–0.1)
Basophils Relative: 0 %
Eosinophils Absolute: 0.3 10*3/uL (ref 0.0–0.5)
Eosinophils Relative: 2 %
HCT: 46.9 % (ref 39.0–52.0)
Hemoglobin: 14.9 g/dL (ref 13.0–17.0)
Immature Granulocytes: 1 %
Lymphocytes Relative: 12 %
Lymphs Abs: 1.7 10*3/uL (ref 0.7–4.0)
MCH: 30.4 pg (ref 26.0–34.0)
MCHC: 31.8 g/dL (ref 30.0–36.0)
MCV: 95.7 fL (ref 80.0–100.0)
Monocytes Absolute: 0.6 10*3/uL (ref 0.1–1.0)
Monocytes Relative: 4 %
Neutro Abs: 11.1 10*3/uL — ABNORMAL HIGH (ref 1.7–7.7)
Neutrophils Relative %: 81 %
Platelets: 254 10*3/uL (ref 150–400)
RBC: 4.9 MIL/uL (ref 4.22–5.81)
RDW: 12.7 % (ref 11.5–15.5)
WBC: 13.9 10*3/uL — ABNORMAL HIGH (ref 4.0–10.5)
nRBC: 0 % (ref 0.0–0.2)

## 2018-10-03 LAB — URINALYSIS, COMPLETE (UACMP) WITH MICROSCOPIC
Bacteria, UA: NONE SEEN
Bilirubin Urine: NEGATIVE
Glucose, UA: NEGATIVE mg/dL
Hgb urine dipstick: NEGATIVE
Ketones, ur: 5 mg/dL — AB
Leukocytes,Ua: NEGATIVE
Nitrite: NEGATIVE
Protein, ur: NEGATIVE mg/dL
Specific Gravity, Urine: 1.029 (ref 1.005–1.030)
pH: 5 (ref 5.0–8.0)

## 2018-10-03 LAB — COMPREHENSIVE METABOLIC PANEL
ALT: 18 U/L (ref 0–44)
AST: 14 U/L — ABNORMAL LOW (ref 15–41)
Albumin: 3.6 g/dL (ref 3.5–5.0)
Alkaline Phosphatase: 88 U/L (ref 38–126)
Anion gap: 10 (ref 5–15)
BUN: 16 mg/dL (ref 8–23)
CO2: 27 mmol/L (ref 22–32)
Calcium: 9.1 mg/dL (ref 8.9–10.3)
Chloride: 101 mmol/L (ref 98–111)
Creatinine, Ser: 1.13 mg/dL (ref 0.61–1.24)
GFR calc Af Amer: >60 mL/min (ref >60)
GFR calc non Af Amer: >60 mL/min (ref >60)
Glucose, Bld: 138 mg/dL — ABNORMAL HIGH (ref 70–99)
Potassium: 3.9 mmol/L (ref 3.5–5.1)
Sodium: 138 mmol/L (ref 135–145)
Total Bilirubin: 0.4 mg/dL (ref 0.3–1.2)
Total Protein: 8.9 g/dL — ABNORMAL HIGH (ref 6.5–8.1)

## 2018-10-03 LAB — SARS CORONAVIRUS 2 BY RT PCR (HOSPITAL ORDER, PERFORMED IN ~~LOC~~ HOSPITAL LAB): SARS Coronavirus 2: NEGATIVE

## 2018-10-03 LAB — LACTIC ACID, PLASMA: Lactic Acid, Venous: 1.5 mmol/L (ref 0.5–1.9)

## 2018-10-03 MED ORDER — POLYETHYLENE GLYCOL 3350 17 G PO PACK: 17.0000 g | PACK | Freq: Every day | ORAL | Status: DC | PRN

## 2018-10-03 MED ORDER — VANCOMYCIN HCL 10 G IV SOLR
1500.0000 mg | Freq: Once | INTRAVENOUS | Status: DC
  Filled 2018-10-03: qty 1500

## 2018-10-03 MED ORDER — ACETAMINOPHEN 650 MG RE SUPP: 650.0000 mg | Freq: Four times a day (QID) | RECTAL | Status: DC | PRN

## 2018-10-03 MED ORDER — ONDANSETRON HCL 4 MG/2ML IJ SOLN: 4.0000 mg | Freq: Four times a day (QID) | INTRAMUSCULAR | Status: DC | PRN

## 2018-10-03 MED ORDER — ONDANSETRON HCL 4 MG/2ML IJ SOLN
4.0000 mg | Freq: Once | INTRAMUSCULAR | Status: AC
  Administered 2018-10-03: 17:00:00 4 mg via INTRAVENOUS
  Filled 2018-10-03: qty 2

## 2018-10-03 MED ORDER — GABAPENTIN 300 MG PO CAPS
600.0000 mg | ORAL_CAPSULE | Freq: Every day | ORAL | Status: DC
  Administered 2018-10-03 – 2018-10-05 (×3): 600 mg via ORAL
  Filled 2018-10-03 (×3): qty 2

## 2018-10-03 MED ORDER — SODIUM CHLORIDE 0.9% FLUSH: 3.0000 mL | Freq: Once | INTRAVENOUS | Status: DC

## 2018-10-03 MED ORDER — CYCLOBENZAPRINE HCL 10 MG PO TABS
10.0000 mg | ORAL_TABLET | Freq: Three times a day (TID) | ORAL | Status: DC | PRN
  Administered 2018-10-04 – 2018-10-05 (×3): 10 mg via ORAL
  Filled 2018-10-03 (×3): qty 1

## 2018-10-03 MED ORDER — ACETAMINOPHEN 325 MG PO TABS: 650.0000 mg | ORAL_TABLET | Freq: Four times a day (QID) | ORAL | Status: DC | PRN

## 2018-10-03 MED ORDER — HYDROCODONE-ACETAMINOPHEN 7.5-325 MG PO TABS
1.0000 | ORAL_TABLET | ORAL | Status: DC | PRN
  Administered 2018-10-03 – 2018-10-06 (×7): 1 via ORAL
  Filled 2018-10-03 (×8): qty 1

## 2018-10-03 MED ORDER — KETOROLAC TROMETHAMINE 30 MG/ML IJ SOLN: 30.0000 mg | Freq: Four times a day (QID) | INTRAMUSCULAR | Status: DC | PRN

## 2018-10-03 MED ORDER — ONDANSETRON HCL 4 MG PO TABS: 4.0000 mg | ORAL_TABLET | Freq: Four times a day (QID) | ORAL | Status: DC | PRN

## 2018-10-03 MED ORDER — SENNA 8.6 MG PO TABS
1.0000 | ORAL_TABLET | Freq: Two times a day (BID) | ORAL | Status: DC
  Administered 2018-10-03 – 2018-10-06 (×6): 8.6 mg via ORAL
  Filled 2018-10-03 (×6): qty 1

## 2018-10-03 MED ORDER — SODIUM CHLORIDE 0.9 % IV SOLN
1.0000 g | INTRAVENOUS | Status: DC
  Administered 2018-10-04: 09:00:00 1 g via INTRAVENOUS
  Filled 2018-10-03: qty 1

## 2018-10-03 MED ORDER — VANCOMYCIN HCL 1.5 G IV SOLR
1500.0000 mg | Freq: Once | INTRAVENOUS | Status: AC
  Administered 2018-10-03: 17:00:00 1500 mg via INTRAVENOUS
  Filled 2018-10-03: qty 1500

## 2018-10-03 MED ORDER — VANCOMYCIN HCL 1.25 G IV SOLR
1250.0000 mg | Freq: Two times a day (BID) | INTRAVENOUS | Status: DC
  Administered 2018-10-04: 05:00:00 1250 mg via INTRAVENOUS
  Filled 2018-10-03 (×2): qty 1250

## 2018-10-03 MED ORDER — ENOXAPARIN SODIUM 40 MG/0.4ML ~~LOC~~ SOLN
40.0000 mg | Freq: Two times a day (BID) | SUBCUTANEOUS | Status: DC
  Administered 2018-10-03 – 2018-10-06 (×6): 40 mg via SUBCUTANEOUS
  Filled 2018-10-03 (×6): qty 0.4

## 2018-10-03 MED ORDER — GABAPENTIN 300 MG PO CAPS
300.0000 mg | ORAL_CAPSULE | Freq: Every morning | ORAL | Status: DC
  Administered 2018-10-04 – 2018-10-06 (×3): 300 mg via ORAL
  Filled 2018-10-03 (×3): qty 1

## 2018-10-03 MED ORDER — SODIUM CHLORIDE 0.9 % IV SOLN
2.0000 g | Freq: Once | INTRAVENOUS | Status: AC
  Administered 2018-10-03: 17:00:00 2 g via INTRAVENOUS
  Filled 2018-10-03: qty 20

## 2018-10-03 MED ORDER — MORPHINE SULFATE (PF) 4 MG/ML IV SOLN
4.0000 mg | Freq: Once | INTRAVENOUS | Status: AC
  Administered 2018-10-03: 17:00:00 4 mg via INTRAVENOUS
  Filled 2018-10-03: qty 1

## 2018-10-03 MED ORDER — DOCUSATE SODIUM 100 MG PO CAPS
100.0000 mg | ORAL_CAPSULE | Freq: Two times a day (BID) | ORAL | Status: DC
  Administered 2018-10-03 – 2018-10-06 (×6): 100 mg via ORAL
  Filled 2018-10-03 (×6): qty 1

## 2018-10-03 NOTE — ED Notes (Signed)
Pt given ginger ale.

## 2018-10-03 NOTE — Progress Notes (Signed)
Corey James ([id number]) Visit Report for 10/03/2018 Abuse/Suicide Risk Screen Details Patient Name: Corey James, Corey James Date of Service: 10/03/2018 8:30 AM Medical Record Number: [id number] Patient Account Number: [id number] Date of Birth/Sex: May 05, 1956 (63 y.o. M) Treating RN: Cornell Barman Primary Care Laneka Mcgrory: [name] Other Clinician: Referring Shimon Trowbridge: Caroleen Hamman Treating Jalien Weakland/Extender: STONE III, HOYT Weeks in Treatment: 0 Abuse/Suicide Risk Screen Items Answer ABUSE RISK SCREEN: Has anyone close to you tried to hurt or harm you recentlyo No Do you feel uncomfortable with anyone in your familyo No Has anyone forced you do things that you didnot want to doo No Electronic Signature(s) Signed: 10/03/2018 4:43:42 PM By: Gretta Cool, BSN, RN, CWS, Kim RN, BSN Entered By: Gretta Cool, BSN, RN, CWS, Kim on 10/03/2018 09:55:33 Ballantine, Viyaan ([id number]) -------------------------------------------------------------------------------- Activities of Daily Living Details Patient Name: Corey James Date of Service: 10/03/2018 8:30 AM Medical Record Number: [id number] Patient Account Number: [id number] Date of Birth/Sex: 05-Nov-1955 (62 y.o. M) Treating RN: Cornell Barman Primary Care Cabot Cromartie: [name] Other Clinician: Referring Wonder Donaway: Caroleen Hamman Treating Breton Berns/Extender: STONE III, HOYT Weeks in Treatment: 0 Activities of Daily Living Items Answer Activities of Daily Living (Please select one for each item) Drive Automobile Completely Able Take Medications Completely Able Use Telephone Completely Able Care for Appearance Completely Able Use Toilet Completely Able Bath / Shower Completely Able Dress Self Completely Able Feed Self Completely Able Walk Completely Able Get In / Out Bed Completely Able Housework Completely Able Prepare Meals Completely [place] for Self Completely Able Electronic Signature(s) Signed: 10/03/2018 4:43:42 PM By: Gretta Cool, BSN,  RN, CWS, Kim RN, BSN Entered By: Gretta Cool, BSN, RN, CWS, Kim on 10/03/2018 09:55:49 Mickiewicz, Acy ([id number]) -------------------------------------------------------------------------------- Education Screening Details Patient Name: Corey James Date of Service: 10/03/2018 8:30 AM Medical Record Number: [id number] Patient Account Number: [id number] Date of Birth/Sex: Oct 27, 1955 (62 y.o. M) Treating RN: Cornell Barman Primary Care Mallorie Norrod: [name] Other Clinician: Referring Venera Privott: Caroleen Hamman Treating Nashia Remus/Extender: Melburn Hake, HOYT Weeks in Treatment: 0 Primary Learner Assessed: Patient Learning Preferences/Education Level/Primary Language Learning Preference: Explanation Highest Education Level: High School Preferred Language: English Cognitive Barrier Language Barrier: No Translator Needed: No Memory Deficit: No Cultural/Religious Beliefs Affecting Medical Care: No Physical Barrier Impaired Vision: No Impaired Hearing: No Decreased Hand dexterity: No Knowledge/Comprehension Knowledge Level: High Comprehension Level: High Ability to understand written High instructions: Ability to understand verbal High instructions: Motivation Anxiety Level: Calm Cooperation: Cooperative Education Importance: Acknowledges Need Interest in Health Problems: Asks Questions Perception: Coherent Willingness to Engage in Self- High Management Activities: Readiness to Engage in Self- High Management Activities: Electronic Signature(s) Signed: 10/03/2018 4:43:42 PM By: Gretta Cool, BSN, RN, CWS, Kim RN, BSN Entered By: Gretta Cool, BSN, RN, CWS, Kim on 10/03/2018 09:56:31 Corey James ([id number]) -------------------------------------------------------------------------------- Fall Risk Assessment Details Patient Name: Corey James Date of Service: 10/03/2018 8:30 AM Medical Record Number: [id number] Patient Account Number: [id number] Date of Birth/Sex: 01/29/1956 (62 y.o. M) Treating RN:  Cornell Barman Primary Care Angelyse Heslin: [name] Other Clinician: Referring Dayjah Selman: Caroleen Hamman Treating Natahsa Marian/Extender: Melburn Hake, HOYT Weeks in Treatment: 0 Fall Risk Assessment Items Have you had 2 or more falls in the last 12 monthso 0 No Have you had any fall that resulted in injury in the last 12 monthso 0 No FALLS RISK SCREEN History of falling - immediate or within 3 months 0 No Secondary diagnosis (Do you have 2 or more medical diagnoseso) 0 No Ambulatory aid None/bed rest/wheelchair/nurse 0 Yes Crutches/cane/walker 0 No Furniture 0 No Intravenous therapy  Access/Saline/Heparin Lock 0 No Gait/Transferring Normal/ bed rest/ wheelchair 0 Yes Weak (short steps with or without shuffle, stooped but able to lift head while 0 No walking, may seek support from furniture) Impaired (short steps with shuffle, may have difficulty arising from chair, head 0 No down, impaired balance) Mental Status Oriented to own ability 0 Yes Electronic Signature(s) Signed: 10/03/2018 4:43:42 PM By: Gretta Cool, BSN, RN, CWS, Kim RN, BSN Entered By: Gretta Cool, BSN, RN, CWS, Kim on 10/03/2018 09:56:48 Nagele, Sirr ([id number]) -------------------------------------------------------------------------------- Foot Assessment Details Patient Name: Corey James Date of Service: 10/03/2018 8:30 AM Medical Record Number: [id number] Patient Account Number: [id number] Date of Birth/Sex: 21-Aug-1955 (62 y.o. M) Treating RN: Cornell Barman Primary Care Kendricks Reap: [name] Other Clinician: Referring Maryrose Colvin: Caroleen Hamman Treating Ellean Firman/Extender: STONE III, HOYT Weeks in Treatment: 0 Foot Assessment Items Site Locations + = Sensation present, - = Sensation absent, C = Callus, U = Ulcer R = Redness, W = Warmth, M = Maceration, PU = Pre-ulcerative lesion F = Fissure, S = Swelling, D = Dryness Assessment Right: Left: Other Deformity: No No Prior Foot Ulcer: No No Prior Amputation: No No Charcot Joint: No  No Ambulatory Status: Ambulatory Without Help Gait: Steady Electronic Signature(s) Signed: 10/03/2018 4:43:42 PM By: Gretta Cool, BSN, RN, CWS, Kim RN, BSN Entered By: Gretta Cool, BSN, RN, CWS, Kim on 10/03/2018 09:57:17 Shetterly, Macarthur ([id number]) -------------------------------------------------------------------------------- Nutrition Risk Screening Details Patient Name: Corey James Date of Service: 10/03/2018 8:30 AM Medical Record Number: [id number] Patient Account Number: [id number] Date of Birth/Sex: Apr 08, 1956 (62 y.o. M) Treating RN: Cornell Barman Primary Care Jaelene Garciagarcia: [name] Other Clinician: Referring Zakariye Nee: Caroleen Hamman Treating Soham Hollett/Extender: STONE III, HOYT Weeks in Treatment: 0 Height (in): 69 Weight (lbs): 336 Body Mass Index (BMI): 49.6 Nutrition Risk Screening Items Score Screening NUTRITION RISK SCREEN: I have an illness or condition that made me change the kind and/or amount of 0 No food I eat I eat fewer than two meals per day 0 No I eat few fruits and vegetables, or milk products 0 No I have three or more drinks of beer, liquor or wine almost every day 0 No I have tooth or mouth problems that make it hard for me to eat 0 No I don't always have enough money to buy the food I need 0 No I eat alone most of the time 0 No I take three or more different prescribed or over-the-counter drugs a day 0 No Without wanting to, I have lost or gained 10 pounds in the last six months 0 No I am not always physically able to shop, cook and/or feed myself 0 No Nutrition Protocols Good Risk Protocol Provide education on Moderate Risk Protocol 0 nutrition High Risk Proctocol Risk Level: Good Risk Score: 0 Electronic Signature(s) Signed: 10/03/2018 4:43:42 PM By: Gretta Cool, BSN, RN, CWS, Kim RN, BSN Entered By: Gretta Cool, BSN, RN, CWS, Kim on 10/03/2018 09:56:55

## 2018-10-03 NOTE — Consult Note (Signed)
Pharmacy Antibiotic Note  Corey James is a 63 y.o. male admitted on 10/03/2018 with cellulitis.  Pharmacy has been consulted for Vancomycin dosing. Patient is also receiving ceftriaxone. ID has been consulted.   Plan: Patient received vancomycin 1500mg  IV x 1 dose in ED. Start Vancomycin 1250 mg IV Q 12 hrs. Goal AUC 400-550. Expected AUC: 540 SCr used: 1.13   Height: 5\' 9"  (175.3 cm) Weight: (!) 332 lb (150.6 kg) IBW/kg (Calculated) : 70.7  Temp (24hrs), Avg:97.7 F (36.5 C), Min:97.7 F (36.5 C), Max:97.7 F (36.5 C)  Recent Labs  Lab 10/03/18 1315  WBC 13.9*  CREATININE 1.13  LATICACIDVEN 1.5    Estimated Creatinine Clearance: 99.7 mL/min (by C-G formula based on SCr of 1.13 mg/dL).    Allergies  Allergen Reactions  . Penicillins Itching and Rash    Did it involve swelling of the face/tongue/throat, SOB, or low BP? Unknown Did it involve sudden or severe rash/hives, skin peeling, or any reaction on the inside of your mouth or nose? Yes Did you need to seek medical attention at a hospital or doctor's office? Unknown When did it last happen?Unknown If all above answers are "NO", may proceed with cephalosporin use.     Antimicrobials this admission: 5/12 ceftriaxone >>  5/12 vancomycin >>   Microbiology results: 5/12 BCx: pending 5/12 SARS Coronavirus 2: negative   Thank you for allowing pharmacy to be a part of this patient's care.  Pernell Dupre, PharmD, BCPS Clinical Pharmacist 10/03/2018 11:29 PM

## 2018-10-03 NOTE — ED Notes (Signed)
ED TO INPATIENT HANDOFF REPORT  ED Nurse Name and Phone #: Metta Clines [phone]  S Name/Age/Gender Corey James 63 y.o. male Room/Bed: ED15A/ED15A  Code Status   Code Status: Prior  Home/SNF/Other Home Patient oriented to: self, place, time and situation Is this baseline? Yes   Triage Complete: Triage complete  Chief Complaint  wound infection  Triage Note Pt sent from the wound center. Pt has been treating a wound of the LLE since february and now is having foul odor, skin slothing, drainage with redness and swelling. Denies fever. States he is currently on abx.    Allergies Allergies  Allergen Reactions  . Penicillins Itching and Rash    Level of Care/Admitting Diagnosis ED Disposition    None      B Medical/Surgery History Past Medical History:  Diagnosis Date  . Back pain   . Cellulitis   . Disc degeneration, lumbosacral   . Hepatitis C    Past Surgical History:  Procedure Laterality Date  . APPLICATION OF WOUND VAC Left 07/14/2018   Procedure: WOUND VAC CHANGE;  Surgeon: Olean Ree, MD;  Location: [facility] ORS;  Service: General;  Laterality: Left;  . INCISION AND DRAINAGE ABSCESS Left 07/11/2018   Procedure: INCISION AND DRAINAGE ABSCESS-LEFT THIGH;  Surgeon: [name], MD;  Location: [facility] ORS;  Service: General;  Laterality: Left;  . WOUND DEBRIDEMENT Left 07/14/2018   Procedure: POSSIBLE DEBRIDEMENT WOUND-LEFT THIGH;  Surgeon: Olean Ree, MD;  Location: [facility] ORS;  Service: General;  Laterality: Left;     A IV Location/Drains/Wounds Patient Lines/Drains/Airways Status   Active Line/Drains/Airways    Name:   Placement date:   Placement time:   Site:   Days:   Peripheral IV 10/03/18 Right Forearm   10/03/18    1500    Forearm   less than 1          Intake/Output Last 24 hours No intake or output data in the 24 hours ending 10/03/18 1757  Labs/Imaging Results for orders placed or performed during the hospital encounter of 10/03/18 (from the  past 48 hour(s))  Lactic acid, plasma     Status: None   Collection Time: 10/03/18  1:15 PM  Result Value Ref Range   Lactic Acid, Venous 1.5 0.5 - 1.9 mmol/L    Comment: Performed at [facility], [address] [place], [place] [address]  Comprehensive metabolic panel     Status: Abnormal   Collection Time: 10/03/18  1:15 PM  Result Value Ref Range   Sodium 138 135 - 145 mmol/L   Potassium 3.9 3.5 - 5.1 mmol/L   Chloride 101 98 - 111 mmol/L   CO2 27 22 - 32 mmol/L   Glucose, Bld 138 (H) 70 - 99 mg/dL   BUN 16 8 - 23 mg/dL   Creatinine, Ser 1.13 0.61 - 1.24 mg/dL   Calcium 9.1 8.9 - 10.3 mg/dL   Total Protein 8.9 (H) 6.5 - 8.1 g/dL   Albumin 3.6 3.5 - 5.0 g/dL   AST 14 (L) 15 - 41 U/L   ALT 18 0 - 44 U/L   Alkaline Phosphatase 88 38 - 126 U/L   Total Bilirubin 0.4 0.3 - 1.2 mg/dL   GFR calc non Af Amer >60 >60 mL/min   GFR calc Af Amer >60 >60 mL/min   Anion gap 10 5 - 15    Comment: Performed at [facility], [address], [place], [place] [address]  CBC with Differential  Status: Abnormal   Collection Time: 10/03/18  1:15 PM  Result Value Ref Range   WBC 13.9 (H) 4.0 - 10.5 K/uL   RBC 4.90 4.22 - 5.81 MIL/uL   Hemoglobin 14.9 13.0 - 17.0 g/dL   HCT 46.9 39.0 - 52.0 %   MCV 95.7 80.0 - 100.0 fL   MCH 30.4 26.0 - 34.0 pg   MCHC 31.8 30.0 - 36.0 g/dL   RDW 12.7 11.5 - 15.5 %   Platelets 254 150 - 400 K/uL   nRBC 0.0 0.0 - 0.2 %   Neutrophils Relative % 81 %   Neutro Abs 11.1 (H) 1.7 - 7.7 K/uL   Lymphocytes Relative 12 %   Lymphs Abs 1.7 0.7 - 4.0 K/uL   Monocytes Relative 4 %   Monocytes Absolute 0.6 0.1 - 1.0 K/uL   Eosinophils Relative 2 %   Eosinophils Absolute 0.3 0.0 - 0.5 K/uL   Basophils Relative 0 %   Basophils Absolute 0.1 0.0 - 0.1 K/uL   Immature Granulocytes 1 %   Abs Immature Granulocytes 0.11 (H) 0.00 - 0.07 K/uL    Comment: Performed at [facility], [address] [place], [place] [address]  Urinalysis,  Complete w Microscopic     Status: Abnormal   Collection Time: 10/03/18  1:15 PM  Result Value Ref Range   Color, Urine YELLOW (A) YELLOW   APPearance CLEAR (A) CLEAR   Specific Gravity, Urine 1.029 1.005 - 1.030   pH 5.0 5.0 - 8.0   Glucose, UA NEGATIVE NEGATIVE mg/dL   Hgb urine dipstick NEGATIVE NEGATIVE   Bilirubin Urine NEGATIVE NEGATIVE   Ketones, ur 5 (A) NEGATIVE mg/dL   Protein, ur NEGATIVE NEGATIVE mg/dL   Nitrite NEGATIVE NEGATIVE   Leukocytes,Ua NEGATIVE NEGATIVE   RBC / HPF 0-5 0 - 5 RBC/hpf   WBC, UA 0-5 0 - 5 WBC/hpf   Bacteria, UA NONE SEEN NONE SEEN   Squamous Epithelial / LPF 0-5 0 - 5   Mucus PRESENT     Comment: Performed at [facility], [address] [place], [place] [address]  SARS Coronavirus 2 (CEPHEID - Performed in [place] hospital lab), Hosp Order     Status: None   Collection Time: 10/03/18  4:45 PM  Result Value Ref Range   SARS Coronavirus 2 NEGATIVE NEGATIVE    Comment: (NOTE) If result is NEGATIVE SARS-CoV-2 target nucleic acids are NOT DETECTED. The SARS-CoV-2 RNA is generally detectable in upper and lower  respiratory specimens during the acute phase of infection. The lowest  concentration of SARS-CoV-2 viral copies this assay can detect is 250  copies / mL. A negative result does not preclude SARS-CoV-2 infection  and should not be used as the sole basis for treatment or other  patient management decisions.  A negative result may occur with  improper specimen collection / handling, submission of specimen other  than nasopharyngeal swab, presence of viral mutation(s) within the  areas targeted by this assay, and inadequate number of viral copies  (<250 copies / mL). A negative result must be combined with clinical  observations, patient history, and epidemiological information. If result is POSITIVE SARS-CoV-2 target nucleic acids are DETECTED. The SARS-CoV-2 RNA is generally detectable in upper and lower  respiratory  specimens dur ing the acute phase of infection.  Positive  results are indicative of active infection with SARS-CoV-2.  Clinical  correlation with patient history and other diagnostic information is  necessary to determine patient  infection status.  Positive results do  not rule out bacterial infection or co-infection with other viruses. If result is PRESUMPTIVE POSTIVE SARS-CoV-2 nucleic acids MAY BE PRESENT.   A presumptive positive result was obtained on the submitted specimen  and confirmed on repeat testing.  While 2019 novel coronavirus  (SARS-CoV-2) nucleic acids may be present in the submitted sample  additional confirmatory testing may be necessary for epidemiological  and / or clinical management purposes  to differentiate between  SARS-CoV-2 and other Sarbecovirus currently known to infect humans.  If clinically indicated additional testing with an alternate test  methodology [phone]) is advised. The SARS-CoV-2 RNA is generally  detectable in upper and lower respiratory sp ecimens during the acute  phase of infection. The expected result is Negative. Fact Sheet for Patients:  StrictlyIdeas.no Fact Sheet for Healthcare Providers: BankingDealers.co.za This test is not yet approved or cleared by the [place] FDA and has been authorized for detection and/or diagnosis of SARS-CoV-2 by FDA under an Emergency Use Authorization (EUA).  This EUA will remain in effect (meaning this test can be used) for the duration of the COVID-19 declaration under Section 564(b)(1) of the Act, 21 U.S.C. section 360bbb-3(b)(1), unless the authorization is terminated or revoked sooner. Performed at [facility], [address] [place], [place] [address]    US Venous Img Lower Unilateral Left  Result Date: 10/03/2018 CLINICAL DATA:  Left lower extremity pain and edema for the past 1.5 weeks. History of smoking. Evaluate for DVT. EXAM:  LEFT LOWER EXTREMITY VENOUS DOPPLER ULTRASOUND TECHNIQUE: Gray-scale sonography with graded compression, as well as color Doppler and duplex ultrasound were performed to evaluate the lower extremity deep venous systems from the level of the common femoral vein and including the common femoral, femoral, profunda femoral, popliteal and calf veins including the posterior tibial, peroneal and gastrocnemius veins when visible. The superficial great saphenous vein was also interrogated. Spectral Doppler was utilized to evaluate flow at rest and with distal augmentation maneuvers in the common femoral, femoral and popliteal veins. COMPARISON:  None. FINDINGS: Examination degraded due to patient body habitus and poor sonographic window. Contralateral Common Femoral Vein: Respiratory phasicity is normal and symmetric with the symptomatic side. No evidence of thrombus. Normal compressibility. Common Femoral Vein: No evidence of thrombus. Normal compressibility, respiratory phasicity and response to augmentation. Saphenofemoral Junction: No evidence of thrombus. Normal compressibility and flow on color Doppler imaging. Profunda Femoral Vein: No evidence of thrombus. Normal compressibility and flow on color Doppler imaging. Femoral Vein: No evidence of thrombus. Normal compressibility, respiratory phasicity and response to augmentation. Popliteal Vein: No evidence of thrombus. Normal compressibility, respiratory phasicity and response to augmentation. Calf Veins: Appear patent where imaged. Superficial Great Saphenous Vein: No evidence of thrombus. Normal compressibility. Venous Reflux:  None. Other Findings:  None. IMPRESSION: No evidence of DVT within the left lower extremity. Electronically Signed   By: Sandi Mariscal M.D.   On: 10/03/2018 16:35    Pending Labs Unresulted Labs (From admission, onward)    Start     Ordered   10/03/18 1310  Blood culture (routine x 2)  BLOOD CULTURE X 2,   STAT     10/03/18 1309           Vitals/Pain Today's Vitals   10/03/18 1638 10/03/18 1639 10/03/18 1700 10/03/18 1719  BP: (!) 148/73   131/73  Pulse:  87 84 82  Resp:  20 (!) 21 18  Temp:      TempSrc:  SpO2:  100% 97% 97%  Weight:      Height:      PainSc:        Isolation Precautions No active isolations  Medications Medications  sodium chloride flush (NS) 0.9 % injection 3 mL (has no administration in time range)  vancomycin (VANCOCIN) 1,500 mg in sodium chloride 0.9 % 500 mL IVPB (1,500 mg Intravenous New Bag/Given 10/03/18 1721)  cefTRIAXone (ROCEPHIN) 2 g in sodium chloride 0.9 % 100 mL IVPB (0 g Intravenous Stopped 10/03/18 1714)  morphine 4 MG/ML injection 4 mg (4 mg Intravenous Given 10/03/18 1727)  ondansetron (ZOFRAN) injection 4 mg (4 mg Intravenous Given 10/03/18 1728)    Mobility walks Low fall risk   Focused Assessments Cellulitis to L leg; red/swollen; pulse 2+; painful   R Recommendations: See Admitting Provider Note  Report given to:   Additional Notes:  Non-adherent dressing applied to L leg; NSR; A&Ox4; RA; R fa 20g IV.

## 2018-10-03 NOTE — ED Notes (Signed)
This RN personally dropped covid 19 swab off to lab.

## 2018-10-03 NOTE — Progress Notes (Signed)
Anticoagulation monitoring(Lovenox):  63 yo male ordered Lovenox 40 mg Q24h  Filed Weights   10/03/18 1303  Weight: (!) 332 lb (150.6 kg)   BMI 49   Lab Results  Component Value Date   CREATININE 1.13 10/03/2018   CREATININE 0.90 09/15/2018   CREATININE 0.88 09/07/2018   Estimated Creatinine Clearance: 99.7 mL/min (by C-G formula based on SCr of 1.13 mg/dL). Hemoglobin & Hematocrit     Component Value Date/Time   HGB 14.9 10/03/2018 1315   HCT 46.9 10/03/2018 1315     Per Protocol for Patient with estCrcl > 30 ml/min and BMI > 40, will transition to Lovenox 40 mg Q12h.

## 2018-10-03 NOTE — ED Notes (Signed)
Keep trying to capture EKG but too much artifact d/t pt's hair.

## 2018-10-03 NOTE — ED Provider Notes (Signed)
[facility] Emergency Department Provider Note  ____________________________________________  Time seen: Approximately 2:31 PM  I have reviewed the triage vital signs and the nursing notes.   HISTORY  Chief Complaint Wound Infection   HPI Corey James is a 63 y.o. male who presents to the emergency department for treatment and evaluation of wound to the left lower extremity.  He was sent here from the wound care center.  They feel he is failing outpatient antibiotics and the wound is worsening. Patient denies fevers or other issues today. He states he has been compliant with his medications.   Past Medical History:  Diagnosis Date  . Back pain   . Cellulitis   . Disc degeneration, lumbosacral   . Hepatitis C     Patient Active Problem List   Diagnosis Date Noted  . Sepsis ([place]) 09/05/2018  . Chronic bilateral low back pain with right-sided sciatica 08/31/2018  . Class 3 severe obesity due to excess calories with serious comorbidity and body mass index (BMI) of 50.0 to 59.9 in adult ([place]) 08/31/2018  . Nocturia 08/31/2018  . Necrotizing soft tissue infection   . Abscess of left lower extremity   . Thigh abscess 07/06/2018  . Arthritis of hip 06/09/2018  . DDD (degenerative disc disease), lumbar 06/09/2018  . Morbid obesity with BMI of 45.0-49.9, adult ([place]) 06/09/2018    Past Surgical History:  Procedure Laterality Date  . APPLICATION OF WOUND VAC Left 07/14/2018   Procedure: WOUND VAC CHANGE;  Surgeon: Olean Ree, MD;  Location: [facility] ORS;  Service: General;  Laterality: Left;  . INCISION AND DRAINAGE ABSCESS Left 07/11/2018   Procedure: INCISION AND DRAINAGE ABSCESS-LEFT THIGH;  Surgeon: [name], MD;  Location: [facility] ORS;  Service: General;  Laterality: Left;  . WOUND DEBRIDEMENT Left 07/14/2018   Procedure: POSSIBLE DEBRIDEMENT WOUND-LEFT THIGH;  Surgeon: Olean Ree, MD;  Location: [facility] ORS;  Service: General;  Laterality: Left;     Prior to Admission medications   Medication Sig Start Date End Date Taking? Authorizing Provider  acetaminophen (TYLENOL) 500 MG tablet Take 1 tablet (500 mg total) by mouth every 6 (six) hours as needed for mild pain or fever. 07/15/18  Yes Gouru, Illene Silver, MD  clindamycin (CLEOCIN) 300 MG capsule Take 1 capsule (300 mg total) by mouth 3 (three) times daily. 09/20/18  Yes Pabon, Diego F, MD  cyclobenzaprine (FLEXERIL) 10 MG tablet PLEASE SEE ATTACHED FOR DETAILED DIRECTIONS 08/18/18  Yes [provider]  docusate sodium (COLACE) 100 MG capsule Take 100 mg by mouth 2 (two) times daily.   Yes [provider]  gabapentin (NEURONTIN) 300 MG capsule Take 300 mg by mouth 2 (two) times daily. Prescribed by Dr. [name]   Yes [provider]  Glecaprevir-Pibrentasvir (MAVYRET) 100-40 MG TABS Take 3 tablets by mouth daily with breakfast. 09/14/18 11/14/18 Yes Lucilla Lame, MD  HYDROcodone-acetaminophen (NORCO) 7.5-325 MG tablet Take 1 tablet by mouth every 4 (four) hours as needed.  08/01/18  Yes [provider]  oxyCODONE-acetaminophen (PERCOCET) 7.5-325 MG tablet Take 1 tablet by mouth every 8 (eight) hours as needed for severe pain. 09/25/18 09/25/19 Yes Pabon, Diego F, MD  atorvastatin (LIPITOR) 40 MG tablet Take 1 tablet (40 mg total) by mouth daily. Patient not taking: Reported on 10/03/2018 09/14/18   [name], FNP  buPROPion [facility] SR) 150 MG 12 hr tablet Take 1 tablet once daily x3 days, then take 1 tablet twice daily. Patient not taking: Reported on 10/03/2018  08/31/18   [name], FNP    Allergies Penicillins  Family History  Problem Relation Age of Onset  . Dementia Mother   . Alzheimer's disease Mother   . CVA Father   . Cancer - Colon Father   . Heart attack Brother   . Hypertension Brother     Social History Social History   Tobacco Use  . Smoking status: Current Every Day Smoker    Packs/day: 1.00    Years: 40.00    Pack years:  40.00    Types: Cigarettes    Start date: 09/06/2018  . Smokeless tobacco: Current User  . Tobacco comment: Wellbutrin  Substance Use Topics  . Alcohol use: Yes    Comment: 1/5 of vodka per month   . Drug use: Not Currently    Types: Marijuana    Review of Systems  Constitutional: Negative for fever. Respiratory: Negative for cough or shortness of breath.  Musculoskeletal: Negative for myalgias Skin: Positive for chronic wound to the left lower extremity. Neurological: Negative for numbness or paresthesias. ____________________________________________   PHYSICAL EXAM:  VITAL SIGNS: ED Triage Vitals  Enc Vitals Group     BP 10/03/18 1302 (!) 168/96     Pulse Rate 10/03/18 1302 96     Resp 10/03/18 1302 20     Temp 10/03/18 1302 97.7 F (36.5 C)     Temp Source 10/03/18 1302 Oral     SpO2 10/03/18 1302 96 %     Weight 10/03/18 1303 (!) 332 lb (150.6 kg)     Height 10/03/18 1303 5\' 9"  (1.753 m)     Head Circumference --      Peak Flow --      Pain Score 10/03/18 1307 8     Pain Loc --      Pain Edu? --      Excl. in [place]? --      Constitutional: Chronically ill appearing. Eyes: Conjunctivae are clear without discharge or drainage. Nose: No rhinorrhea noted. Mouth/Throat: Airway is patent.  Neck: No stridor. Unrestricted range of motion observed. Cardiovascular: Capillary refill in the left foot is <3 seconds. Respiratory: Respirations are even and unlabored.. Musculoskeletal: Unrestricted range of motion observed. Neurologic: Awake, alert, and oriented x 4.  Skin:  Cellulitis covering left lower extremity from knee to ankle sparing the posterior calf and foot. Ulcerations over the lateral lower aspect of the area with small amount of bleeding after removal of the dressing. Skin is sloughing and weeping in other areas.  ____________________________________________   LABS (all labs ordered are listed, but only abnormal results are displayed)  Labs Reviewed   COMPREHENSIVE METABOLIC PANEL - Abnormal; Notable for the following components:      Result Value   Glucose, Bld 138 (*)    Total Protein 8.9 (*)    AST 14 (*)    All other components within normal limits  CBC WITH DIFFERENTIAL/PLATELET - Abnormal; Notable for the following components:   WBC 13.9 (*)    Neutro Abs 11.1 (*)    Abs Immature Granulocytes 0.11 (*)    All other components within normal limits  URINALYSIS, COMPLETE (UACMP) WITH MICROSCOPIC - Abnormal; Notable for the following components:   Color, Urine YELLOW (*)    APPearance CLEAR (*)    Ketones, ur 5 (*)    All other components within normal limits  SARS CORONAVIRUS 2 ([facility])  CULTURE, BLOOD (ROUTINE X 2)  CULTURE,  BLOOD (ROUTINE X 2)  LACTIC ACID, PLASMA   ____________________________________________  EKG  Not indicated. ____________________________________________  RADIOLOGY  US of the left lower extremity is negative for DVT. ____________________________________________   PROCEDURES  Procedures ____________________________________________   INITIAL IMPRESSION / ASSESSMENT AND PLAN / ED COURSE  Corey James is a 63 y.o. male who presents to the emergency department for treatment and evaluation of chronic cellulitis to the left lower extremity.  He saw Dr. Perrin Maltese  on Sep 25, 2018 and was continued on his clindamycin and placed in an [employer].  He went today for his wound care and was consequently sent here because the wound care center felt that the wound was getting worse instead of better.  ----------------------------------------- 3:34 PM on 10/03/2018 -----------------------------------------  WBC beginning to bump up. Blood cultures in progress. Lactic acid is normal. Vancomycin and Rocephin ordered.  Plan for admission discussed with the patient. He is agreeable to staying. Need to do COVID-19 screening prior to admission as well as US of the  lower extremity just to ensure there is no clot.   ----------------------------------------- 5:16 PM on 10/03/2018 ----------------------------------------- [place] is negative for DVT. Awaiting COVID-19 results prior to having hospitalist staff admit.   Medications  sodium chloride flush (NS) 0.9 % injection 3 mL (has no administration in time range)  vancomycin (VANCOCIN) 1,500 mg in sodium chloride 0.9 % 500 mL IVPB (1,500 mg Intravenous New Bag/Given 10/03/18 1721)  cefTRIAXone (ROCEPHIN) 2 g in sodium chloride 0.9 % 100 mL IVPB (0 g Intravenous Stopped 10/03/18 1714)  morphine 4 MG/ML injection 4 mg (4 mg Intravenous Given 10/03/18 1727)  ondansetron (ZOFRAN) injection 4 mg (4 mg Intravenous Given 10/03/18 1728)     Pertinent labs & imaging results that were available during my care of the patient were reviewed by me and considered in my medical decision making (see chart for details).  ____________________________________________   FINAL CLINICAL IMPRESSION(S) / ED DIAGNOSES  Final diagnoses:  Cellulitis of left lower extremity    ED Discharge Orders    None       Note:  This document was prepared using Dragon voice recognition software and may include unintentional dictation errors.   Victorino Dike, FNP 10/03/18 1836    Merlyn Lot, MD 10/03/18 2024

## 2018-10-03 NOTE — ED Notes (Signed)
EKG completed

## 2018-10-03 NOTE — H&P (Signed)
[facility] [place] NAME: Corey James    MR#:  [id number]  DATE OF BIRTH:  20-Dec-1955  DATE OF ADMISSION:  10/03/2018  PRIMARY CARE PHYSICIAN: [name], FNP   REQUESTING/REFERRING PHYSICIAN:  Dr Quentin Cornwall  CHIEF COMPLAINT:   Increasing redness and worsening discharged with pain in left lower extremity HISTORY OF PRESENT ILLNESS:  Corey James  is a 63 y.o. male with a known history of left lower extremity chronic venous stasis ulcers who was recently admitted on 14th of April discharge on 19th of April with oral clindamycin and Keflex for left lower extremity cellulitis. There was no evidence of abscess on necrotizing fasciitis. He was at that time followed by surgery and infectious disease.  Treatment for surgical follow-up as outpatient with Dr. Adora Fridge. When the boot was applied. Today he went to the wound care clinic and they removed the boot and patient was noted to have worsening pain with significant amount discharge and worsening redness.  IN The ER he received Rocephin and IV vancomycin.  PAST MEDICAL HISTORY:   Past Medical History:  Diagnosis Date  . Back pain   . Cellulitis   . Disc degeneration, lumbosacral   . Hepatitis C     PAST SURGICAL HISTOIRY:   Past Surgical History:  Procedure Laterality Date  . APPLICATION OF WOUND VAC Left 07/14/2018   Procedure: WOUND VAC CHANGE;  Surgeon: Olean Ree, MD;  Location: [facility] ORS;  Service: General;  Laterality: Left;  . INCISION AND DRAINAGE ABSCESS Left 07/11/2018   Procedure: INCISION AND DRAINAGE ABSCESS-LEFT THIGH;  Surgeon: [name], MD;  Location: [facility] ORS;  Service: General;  Laterality: Left;  . WOUND DEBRIDEMENT Left 07/14/2018   Procedure: POSSIBLE DEBRIDEMENT WOUND-LEFT THIGH;  Surgeon: Olean Ree, MD;  Location: [facility] ORS;  Service: General;  Laterality: Left;    SOCIAL HISTORY:   Social History   Tobacco Use  . Smoking status: Current  Every Day Smoker    Packs/day: 1.00    Years: 40.00    Pack years: 40.00    Types: Cigarettes    Start date: 09/06/2018  . Smokeless tobacco: Current User  . Tobacco comment: Wellbutrin  Substance Use Topics  . Alcohol use: Yes    Comment: 1/5 of vodka per month     FAMILY HISTORY:   Family History  Problem Relation Age of Onset  . Dementia Mother   . Alzheimer's disease Mother   . CVA Father   . Cancer - Colon Father   . Heart attack Brother   . Hypertension Brother     DRUG ALLERGIES:   Allergies  Allergen Reactions  . Penicillins Itching and Rash    Did it involve swelling of the face/tongue/throat, SOB, or low BP? Unknown Did it involve sudden or severe rash/hives, skin peeling, or any reaction on the inside of your mouth or nose? Yes Did you need to seek medical attention at a hospital or doctor's office? Unknown When did it last happen?Unknown If all above answers are "NO", may proceed with cephalosporin use.     REVIEW OF SYSTEMS:  Review of Systems  Constitutional: Negative for chills, fever and weight loss.  HENT: Negative for ear discharge, ear pain and nosebleeds.   Eyes: Negative for blurred vision, pain and discharge.  Respiratory: Negative for sputum production, shortness of breath, wheezing and stridor.   Cardiovascular: Negative for chest pain, palpitations, orthopnea and PND.  Gastrointestinal: Negative for abdominal  pain, diarrhea, nausea and vomiting.  Genitourinary: Negative for frequency and urgency.  Musculoskeletal: Positive for back pain and joint pain.  Skin: Positive for rash.  Neurological: Negative for sensory change, speech change, focal weakness and weakness.  Psychiatric/Behavioral: Negative for depression and hallucinations. The patient is not nervous/anxious.      MEDICATIONS AT HOME:   Prior to Admission medications   Medication Sig Start Date End Date Taking? Authorizing Provider  acetaminophen (TYLENOL) 500 MG tablet  Take 1 tablet (500 mg total) by mouth every 6 (six) hours as needed for mild pain or fever. 07/15/18  Yes Gouru, Illene Silver, MD  clindamycin (CLEOCIN) 300 MG capsule Take 1 capsule (300 mg total) by mouth 3 (three) times daily. 09/20/18  Yes Pabon, Diego F, MD  cyclobenzaprine (FLEXERIL) 10 MG tablet Take 10 mg by mouth as directed.  08/18/18  Yes [provider]  docusate sodium (COLACE) 100 MG capsule Take 100 mg by mouth 2 (two) times daily.   Yes [provider]  gabapentin (NEURONTIN) 300 MG capsule Take 300-600 mg by mouth See admin instructions. Take 1 capsule (300mg ) by mouth every morning and up to 2 capsules (600mg ) by mouth at bedtime   Yes [provider]  Glecaprevir-Pibrentasvir (MAVYRET) 100-40 MG TABS Take 3 tablets by mouth daily with breakfast. 09/14/18 11/14/18 Yes Lucilla Lame, MD  HYDROcodone-acetaminophen (NORCO) 7.5-325 MG tablet Take 1 tablet by mouth every 4 (four) hours as needed for severe pain.  08/01/18  Yes [provider]  oxyCODONE-acetaminophen (PERCOCET) 7.5-325 MG tablet Take 1 tablet by mouth every 8 (eight) hours as needed for severe pain. 09/25/18 09/25/19 Yes Pabon, [place], MD      VITAL SIGNS:  Blood pressure 131/73, pulse 83, temperature 97.7 F (36.5 C), temperature source Oral, resp. rate 19, height 5\' 9"  (1.753 m), weight (!) 150.6 kg, SpO2 97 %.  PHYSICAL EXAMINATION:  GENERAL:  63 y.o.-year-old patient lying in the bed with no acute distress. Morbidly obese EYES: Pupils equal, round, reactive to light and accommodation. No scleral icterus. Extraocular muscles intact.  HEENT: Head atraumatic, normocephalic. Oropharynx and nasopharynx clear.  NECK:  Supple, no jugular venous distention. No thyroid enlargement, no tenderness.  LUNGS: Normal breath sounds bilaterally, no wheezing, rales,rhonchi or crepitation. No use of accessory muscles of respiration.  CARDIOVASCULAR: S1, S2 normal. No murmurs, rubs, or gallops.  ABDOMEN: Soft,  nontender, nondistended. Bowel sounds present. No organomegaly or mass.  EXTREMITIES:      NEUROLOGIC: Cranial nerves II through XII are intact. Muscle strength 5/5 in all extremities. Sensation intact. Gait not checked.  PSYCHIATRIC: The patient is alert and oriented x 3.  SKIN: as above  LABORATORY PANEL:   CBC Recent Labs  Lab 10/03/18 1315  WBC 13.9*  HGB 14.9  HCT 46.9  PLT 254   ------------------------------------------------------------------------------------------------------------------  Chemistries  Recent Labs  Lab 10/03/18 1315  NA 138  K 3.9  CL 101  CO2 27  GLUCOSE 138*  BUN 16  CREATININE 1.13  CALCIUM 9.1  AST 14*  ALT 18  ALKPHOS 88  BILITOT 0.4   ------------------------------------------------------------------------------------------------------------------  Cardiac Enzymes No results for input(s): TROPONINI in the last 168 hours. ------------------------------------------------------------------------------------------------------------------  RADIOLOGY:  US Venous Img Lower Unilateral Left  Result Date: 10/03/2018 CLINICAL DATA:  Left lower extremity pain and edema for the past 1.5 weeks. History of smoking. Evaluate for DVT. EXAM: LEFT LOWER EXTREMITY VENOUS DOPPLER ULTRASOUND TECHNIQUE: Gray-scale sonography with graded compression, as well as color Doppler and  duplex ultrasound were performed to evaluate the lower extremity deep venous systems from the level of the common femoral vein and including the common femoral, femoral, profunda femoral, popliteal and calf veins including the posterior tibial, peroneal and gastrocnemius veins when visible. The superficial great saphenous vein was also interrogated. Spectral Doppler was utilized to evaluate flow at rest and with distal augmentation maneuvers in the common femoral, femoral and popliteal veins. COMPARISON:  None. FINDINGS: Examination degraded due to patient body habitus and poor  sonographic window. Contralateral Common Femoral Vein: Respiratory phasicity is normal and symmetric with the symptomatic side. No evidence of thrombus. Normal compressibility. Common Femoral Vein: No evidence of thrombus. Normal compressibility, respiratory phasicity and response to augmentation. Saphenofemoral Junction: No evidence of thrombus. Normal compressibility and flow on color Doppler imaging. Profunda Femoral Vein: No evidence of thrombus. Normal compressibility and flow on color Doppler imaging. Femoral Vein: No evidence of thrombus. Normal compressibility, respiratory phasicity and response to augmentation. Popliteal Vein: No evidence of thrombus. Normal compressibility, respiratory phasicity and response to augmentation. Calf Veins: Appear patent where imaged. Superficial Great Saphenous Vein: No evidence of thrombus. Normal compressibility. Venous Reflux:  None. Other Findings:  None. IMPRESSION: No evidence of DVT within the left lower extremity. Electronically Signed   By: Sandi Mariscal M.D.   On: 10/03/2018 16:35    EKG:    IMPRESSION AND PLAN:  Corey James  is a 63 y.o. male with a known history of left lower extremity chronic venous stasis ulcers who was recently admitted on 14th of April discharge on 19th of April with oral clindamycin and Keflex for left lower extremity cellulitis. There was no evidence of abscess on necrotizing fasciitis. He was at that time followed by surgery and infectious disease.  1. Cellulitis left lower extremity recurrent with chronic venous stasis ulcer setting of chronic tobacco abuse -IV Rocephin and vancomycin -patient recently finished the course of Keflex and clindamycin -infectious disease consultation-- message sent to Dr. Tama High -consider surgical consultation if needed -? Vascular consult for possible PAD given history of smoking  2. Morbid obesity  3. Chronic pain syndrome with history of sciatica -patient has been on narcotics he  tells me he gets it through his primary care physician and [name]  4. DVT prophylaxis subcu Lovenox   All the records are reviewed and case discussed with ED provider.   CODE STATUS: full  TOTAL TIME TAKING CARE OF THIS PATIENT: *55* minutes.    Fritzi Mandes M.D on 10/03/2018 at 6:57 PM  Between 7am to 6pm - Pager - [phone]  After 6pm go to [email or web address] - password [email or web address] [facility]  [facility]  Office  [phone]  CC: Primary care physician; [name], FNP

## 2018-10-03 NOTE — Progress Notes (Addendum)
[name], [name] ([id number]) Visit Report for 10/03/2018 Allergy List Details Patient Name: Corey James Date of Service: 10/03/2018 8:30 AM Medical Record Number: [id number] Patient Account Number: [id number] Date of Birth/Sex: [birth year] (63 y.o. M) Treating RN: Cornell Barman Primary Care Tadarrius Burch: [name] Other Clinician: Referring Kiylee Thoreson: Caroleen Hamman Treating Iosefa Weintraub/Extender: Melburn Hake, HOYT Weeks in Treatment: 0 Allergies Active Allergies penicillin Allergy Notes Electronic Signature(s) Signed: 10/03/2018 4:43:42 PM By: Gretta Cool, BSN, RN, CWS, Kim RN, BSN Entered By: Gretta Cool, BSN, RN, CWS, Kim on 10/03/2018 09:52:26 Corey James ([id number]) -------------------------------------------------------------------------------- Arrival Information Details Patient Name: Corey James Date of Service: 10/03/2018 8:30 AM Medical Record Number: [id number] Patient Account Number: [id number] Date of Birth/Sex: 1955/09/25 (62 y.o. M) Treating RN: Cornell Barman Primary Care Leandra Vanderweele: [name] Other Clinician: Referring Wyeth Hoffer: Caroleen Hamman Treating Graceann Boileau/Extender: Melburn Hake, HOYT Weeks in Treatment: 0 Visit Information Patient Arrived: Ambulatory Arrival Time: 09:13 Accompanied By: self Transfer Assistance: None Patient Identification Verified: Yes Secondary Verification Process Completed: Yes Patient Has Alerts: Yes Electronic Signature(s) Signed: 10/03/2018 4:43:42 PM By: Gretta Cool, BSN, RN, CWS, Kim RN, BSN Entered By: Gretta Cool, BSN, RN, CWS, Kim on 10/03/2018 [place] Corey James ([id number]) -------------------------------------------------------------------------------- Clinic Level of Care Assessment Details Patient Name: Corey James Date of Service: 10/03/2018 8:30 AM Medical Record Number: [id number] Patient Account Number: [id number] Date of Birth/Sex: 04/09/1956 (62 y.o. M) Treating RN: Cornell Barman Primary Care Anye Brose: [name] Other Clinician: Referring Rajah Lamba:  Caroleen Hamman Treating Augustine Brannick/Extender: STONE III, HOYT Weeks in Treatment: 0 Clinic Level of Care Assessment Items TOOL 2 Quantity Score []  - Use when only an EandM is performed on the INITIAL visit 0 ASSESSMENTS - Nursing Assessment / Reassessment X - General Physical Exam (combine w/ comprehensive assessment (listed just below) when 1 20 performed on new pt. evals) X- 1 25 Comprehensive Assessment (HX, ROS, Risk Assessments, Wounds Hx, etc.) ASSESSMENTS - Wound and Skin Assessment / Reassessment []  - Simple Wound Assessment / Reassessment - one wound 0 X- 2 5 Complex Wound Assessment / Reassessment - multiple wounds []  - 0 Dermatologic / Skin Assessment (not related to wound area) ASSESSMENTS - Ostomy and/or Continence Assessment and Care []  - Incontinence Assessment and Management 0 []  - 0 Ostomy Care Assessment and Management (repouching, etc.) PROCESS - Coordination of Care X - Simple Patient / Family Education for ongoing care 1 15 []  - 0 Complex (extensive) Patient / Family Education for ongoing care X- 1 10 Staff obtains Programmer, systems, Records, Test Results / Process Orders []  - 0 Staff telephones HHA, Nursing Homes / Clarify orders / etc []  - 0 Routine Transfer to another Facility (non-emergent condition) X- 1 10 Routine Hospital Admission (non-emergent condition) X- 1 15 New Admissions / Biomedical engineer / Ordering NPWT, Apligraf, etc. []  - 0 Emergency Hospital Admission (emergent condition) X- 1 10 Simple Discharge Coordination []  - 0 Complex (extensive) Discharge Coordination PROCESS - Special Needs []  - Pediatric / Minor Patient Management 0 []  - 0 Isolation Patient Management James, Corey ([id number]) []  - 0 Hearing / Language / Visual special needs []  - 0 Assessment of Community assistance (transportation, D/C planning, etc.) []  - 0 Additional assistance / Altered mentation []  - 0 Support Surface(s) Assessment (bed, cushion, seat,  etc.) INTERVENTIONS - Wound Cleansing / Measurement X - Wound Imaging (photographs - any number of wounds) 1 5 []  - 0 Wound Tracing (instead of photographs) []  - 0 Simple Wound Measurement - one wound X- 2 5 Complex Wound Measurement - multiple wounds []  - 0 Simple  Wound Cleansing - one wound X- 2 5 Complex Wound Cleansing - multiple wounds INTERVENTIONS - Wound Dressings []  - Small Wound Dressing one or multiple wounds 0 X- 2 15 Medium Wound Dressing one or multiple wounds []  - 0 Large Wound Dressing one or multiple wounds []  - 0 Application of Medications - injection INTERVENTIONS - Miscellaneous []  - External ear exam 0 []  - 0 Specimen Collection (cultures, biopsies, blood, body fluids, etc.) []  - 0 Specimen(s) / Culture(s) sent or taken to Lab for analysis []  - 0 Patient Transfer (multiple staff / Civil Service fast streamer / Similar devices) []  - 0 Simple Staple / Suture removal (25 or less) []  - 0 Complex Staple / Suture removal (26 or more) []  - 0 Hypo / Hyperglycemic Management (close monitor of Blood Glucose) []  - 0 Ankle / Brachial Index (ABI) - do not check if billed separately Has the patient been seen at the hospital within the last three years: Yes Total Score: 170 Level Of Care: New/Established - Level 5 Electronic Signature(s) Signed: 10/03/2018 4:43:42 PM By: Gretta Cool, BSN, RN, CWS, Kim RN, BSN Entered By: Gretta Cool, BSN, RN, CWS, Kim on 10/03/2018 11:38:50 Corey James ([id number]) -------------------------------------------------------------------------------- Encounter Discharge Information Details Patient Name: Corey James Date of Service: 10/03/2018 8:30 AM Medical Record Number: [id number] Patient Account Number: [id number] Date of Birth/Sex: 11-Jul-1955 (62 y.o. M) Treating RN: Harold Barban Primary Care Angy Swearengin: [name] Other Clinician: Referring Corey James: Caroleen Hamman Treating Corey James/Extender: Melburn Hake, HOYT Weeks in Treatment: 0 Encounter Discharge  Information Items Discharge Condition: Stable Ambulatory Status: Ambulatory Discharge Destination: Home Transportation: Private Auto Schedule Follow-up Appointment: Yes Clinical Summary of Care: Electronic Signature(s) Signed: 10/03/2018 2:37:52 PM By: Harold Barban Entered By: Harold Barban on 10/03/2018 14:37:51 Arango, Jacari ([id number]) -------------------------------------------------------------------------------- Lower Extremity Assessment Details Patient Name: Corey James Date of Service: 10/03/2018 8:30 AM Medical Record Number: [id number] Patient Account Number: [id number] Date of Birth/Sex: 10/07/1955 (62 y.o. M) Treating RN: Cornell Barman Primary Care Willene Holian: [name] Other Clinician: Referring Kea Callan: Caroleen Hamman Treating Dilyn Osoria/Extender: STONE III, HOYT Weeks in Treatment: 0 Edema Assessment Assessed: [Left: No] [Right: No] [Left: Edema] [Right: :] Calf Left: Right: Point of Measurement: 34 cm From Medial Instep 42 cm cm Ankle Left: Right: Point of Measurement: 12 cm From Medial Instep 24 cm cm Vascular Assessment Pulses: Dorsalis Pedis Palpable: [Left:Yes] Posterior Tibial Palpable: [Left:Yes] Electronic Signature(s) Signed: 10/03/2018 4:43:42 PM By: Gretta Cool, BSN, RN, CWS, Kim RN, BSN Entered By: Gretta Cool, BSN, RN, CWS, Kim on 10/03/2018 09:57:55 Payer, Reyden ([id number]) -------------------------------------------------------------------------------- Multi Wound Chart Details Patient Name: Corey James Date of Service: 10/03/2018 8:30 AM Medical Record Number: [id number] Patient Account Number: [id number] Date of Birth/Sex: 27-Dec-1955 (62 y.o. M) Treating RN: Cornell Barman Primary Care Kenson Groh: [name] Other Clinician: Referring Bob Daversa: Caroleen Hamman Treating Anwitha Mapes/Extender: STONE III, HOYT Weeks in Treatment: 0 Vital Signs Height(in): 69 Pulse(bpm): 80 Weight(lbs): 336 Blood Pressure(mmHg): 167/77 Body Mass Index(BMI):  50 Temperature(F): 98 Respiratory Rate 18 (breaths/min): Photos: [N/A:N/A] Wound Location: Left Groin Left Lower Leg - N/A Circumferential Wounding Event: Surgical Injury Gradually Appeared N/A Primary Etiology: Cellulitis Cellulitis N/A Date Acquired: 07/07/2018 09/04/2018 N/A Weeks of Treatment: 0 0 N/A Wound Status: Open Open N/A Measurements L x W x D 3x9x0.1 28x40x0.1 N/A (cm) Area (cm) : 21.206 879.646 N/A Volume (cm) : 2.121 87.965 N/A % Reduction in Area: 0.00% N/A N/A % Reduction in Volume: 0.00% N/A N/A Classification: Full Thickness Without Full Thickness Without N/A Exposed Support Structures Exposed Support Structures Exudate Amount: Medium  Large N/A Exudate Type: Serous Purulent N/A Exudate Color: amber yellow, brown, green N/A Wound Margin: Flat and Intact Indistinct, nonvisible N/A Granulation Amount: Large (67-100%) Medium (34-66%) N/A Jester, Tyde ([id number]) Granulation Quality: Red, Friable Red, Hyper-granulation, N/A Friable Necrotic Amount: None Present (0%) Medium (34-66%) N/A Necrotic Tissue: N/A Eschar, Adherent Slough N/A Exposed Structures: Fat Layer (Subcutaneous Fat Layer (Subcutaneous N/A Tissue) Exposed: Yes Tissue) Exposed: Yes Fascia: No Fascia: No Tendon: No Tendon: No Muscle: No Muscle: No Joint: No Joint: No Bone: No Bone: No Epithelialization: Small (1-33%) Medium (34-66%) N/A Periwound Skin Texture: No Abnormalities Noted Excoriation: Yes N/A Induration: Yes Scarring: Yes Callus: No Crepitus: No Rash: No Periwound Skin Moisture: No Abnormalities Noted Maceration: No N/A Dry/Scaly: No Periwound Skin Color: Ecchymosis: Yes Rubor: Yes N/A Rubor: Yes Atrophie Blanche: No Cyanosis: No Ecchymosis: No Erythema: No Hemosiderin Staining: No Mottled: No Pallor: No Temperature: No Abnormality N/A N/A Tenderness on Palpation: Yes No N/A Treatment Notes Electronic Signature(s) Signed: 10/03/2018 10:38:25 AM By: Gretta Cool,  BSN, RN, CWS, Kim RN, BSN Entered By: Gretta Cool, BSN, RN, CWS, Kim on 10/03/2018 10:38:25 Corey James ([id number]) -------------------------------------------------------------------------------- Multi-Disciplinary Care Plan Details Patient Name: Corey James Date of Service: 10/03/2018 8:30 AM Medical Record Number: [id number] Patient Account Number: [id number] Date of Birth/Sex: 1956-02-28 (62 y.o. M) Treating RN: Cornell Barman Primary Care Tyren Dugar: [name] Other Clinician: Referring Ronit Marczak: Caroleen Hamman Treating Georgena Weisheit/Extender: Melburn Hake, HOYT Weeks in Treatment: 0 Active Inactive Orientation to the Wound Care Program Nursing Diagnoses: Knowledge deficit related to the wound healing center program Goals: Patient/caregiver will verbalize understanding of the [place] Program Date Initiated: 10/03/2018 Target Resolution Date: 10/03/2018 Goal Status: Active Interventions: Provide education on orientation to the wound center Notes: Soft Tissue Infection Nursing Diagnoses: Impaired tissue integrity Knowledge deficit related to home infection control: handwashing, handling of soiled dressings, supply storage Goals: Patient/caregiver will verbalize understanding of or measures to prevent infection and contamination in the home setting Date Initiated: 10/03/2018 Target Resolution Date: 10/03/2018 Goal Status: Active Patient's soft tissue infection will resolve Date Initiated: 10/03/2018 Target Resolution Date: 10/10/2018 Goal Status: Active Signs and symptoms of infection will be recognized early to allow for prompt treatment Date Initiated: 10/03/2018 Target Resolution Date: 10/03/2018 Goal Status: Active Interventions: Assess signs and symptoms of infection every visit Treatment Activities: Systemic antibiotics : 10/03/2018 Notes: Wound/Skin Impairment Glasscock, Skylur ([id number]) Nursing Diagnoses: Impaired tissue integrity Knowledge deficit related to smoking  impact on wound healing Knowledge deficit related to ulceration/compromised skin integrity Goals: Patient/caregiver will verbalize understanding of skin care regimen Date Initiated: 10/03/2018 Target Resolution Date: 10/03/2018 Goal Status: Active Ulcer/skin breakdown will have a volume reduction of 30% by week 4 Date Initiated: 10/03/2018 Target Resolution Date: 11/03/2018 Goal Status: Active Interventions: Assess ulceration(s) every visit Provide education on ulcer and skin care Treatment Activities: Refer to smoking cessation program : 10/03/2018 Topical wound management initiated : 10/03/2018 Notes: Electronic Signature(s) Signed: 10/03/2018 10:38:01 AM By: Gretta Cool, BSN, RN, CWS, Kim RN, BSN Entered By: Gretta Cool, BSN, RN, CWS, Kim on 10/03/2018 10:38:00 Corey James ([id number]) -------------------------------------------------------------------------------- Pain Assessment Details Patient Name: Corey James Date of Service: 10/03/2018 8:30 AM Medical Record Number: [id number] Patient Account Number: [id number] Date of Birth/Sex: Apr 25, 1956 (62 y.o. M) Treating RN: Cornell Barman Primary Care Kentley Blyden: [name] Other Clinician: Referring Falicity Sheets: Caroleen Hamman Treating Effrey Davidow/Extender: STONE III, HOYT Weeks in Treatment: 0 Active Problems Location of Pain Severity and Description of Pain Patient Has Paino No Site Locations Rate the pain. Current Pain Level:  5 Character of Pain Describe the Pain: Burning, Stabbing Pain Management and Medication Current Pain Management: Electronic Signature(s) Signed: 10/03/2018 4:43:42 PM By: Gretta Cool, BSN, RN, CWS, Kim RN, BSN Entered By: Gretta Cool, BSN, RN, CWS, Kim on 10/03/2018 [place], [place] ([id number]) -------------------------------------------------------------------------------- Patient/Caregiver Education Details Patient Name: Corey James Date of Service: 10/03/2018 8:30 AM Medical Record Number: [id number] Patient Account Number:  [id number] Date of Birth/Gender: 03/19/1956 (62 y.o. M) Treating RN: Cornell Barman Primary Care Physician: [name] Other Clinician: Referring Physician: Caroleen Hamman Treating Physician/Extender: Melburn Hake, HOYT Weeks in Treatment: 0 Education Assessment Education Provided To: Patient Education Topics Provided Wound/Skin Impairment: Handouts: Caring for Your Ulcer, Other: Go to Emergency Department for Evaluation and treatment of left leg cellulitis Methods: Demonstration, Explain/Verbal Responses: State content correctly Electronic Signature(s) Signed: 10/03/2018 4:43:42 PM By: Gretta Cool, BSN, RN, CWS, Kim RN, BSN Entered By: Gretta Cool, BSN, RN, CWS, Kim on 10/03/2018 11:40:01 [place], [place] ([id number]) -------------------------------------------------------------------------------- Wound Assessment Details Patient Name: Corey James Date of Service: 10/03/2018 8:30 AM Medical Record Number: [id number] Patient Account Number: [id number] Date of Birth/Sex: 10-May-1956 (62 y.o. M) Treating RN: Cornell Barman Primary Care Kaetlyn Noa: [name] Other Clinician: Referring Yaniyah Koors: Caroleen Hamman Treating Yukio Bisping/Extender: STONE III, HOYT Weeks in Treatment: 0 Wound Status Wound Number: 1 Primary Etiology: Cellulitis Wound Location: Left Groin Wound Status: Open Wounding Event: Surgical Injury Comorbid History: Hypertension, Hepatitis C Date Acquired: 07/07/2018 Weeks Of Treatment: 0 Clustered Wound: No Photos Wound Measurements Length: (cm) 3 Width: (cm) 9 Depth: (cm) 0.1 Area: (cm) 21.206 Volume: (cm) 2.121 % Reduction in Area: 0% % Reduction in Volume: 0% Epithelialization: Small (1-33%) Tunneling: No Undermining: No Wound Description Full Thickness Without Exposed Support Classification: Structures Wound Margin: Flat and Intact Exudate Medium Amount: Exudate Type: Serous Exudate Color: amber Foul Odor After Cleansing: No Slough/Fibrino No Wound Bed Granulation  Amount: Large (67-100%) Exposed Structure Granulation Quality: Red, Friable Fascia Exposed: No Necrotic Amount: None Present (0%) Fat Layer (Subcutaneous Tissue) Exposed: Yes Tendon Exposed: No Muscle Exposed: No Joint Exposed: No Bone Exposed: No Periwound Skin Texture Mischke, Tyric ([id number]) Texture Color No Abnormalities Noted: No No Abnormalities Noted: No Ecchymosis: Yes Moisture Rubor: Yes No Abnormalities Noted: No Temperature / Pain Temperature: No Abnormality Tenderness on Palpation: Yes Assessment Notes Sutures in base of wound. Treatment Notes Wound #1 (Left Groin) Notes ABD, conform Electronic Signature(s) Signed: 10/03/2018 10:39:12 AM By: Gretta Cool, BSN, RN, CWS, Kim RN, BSN Entered By: Gretta Cool, BSN, RN, CWS, Kim on 10/03/2018 10:39:12 Macha, Doren Custard ([id number]) -------------------------------------------------------------------------------- Wound Assessment Details Patient Name: Corey James Date of Service: 10/03/2018 8:30 AM Medical Record Number: [id number] Patient Account Number: [id number] Date of Birth/Sex: June 27, 1955 (62 y.o. M) Treating RN: Cornell Barman Primary Care Demarrion Meiklejohn: [name] Other Clinician: Referring Leiya Keesey: Caroleen Hamman Treating Jaylinn Hellenbrand/Extender: STONE III, HOYT Weeks in Treatment: 0 Wound Status Wound Number: 2 Primary Etiology: Cellulitis Wound Location: Left Lower Leg - Circumferential Wound Status: Open Wounding Event: Gradually Appeared Date Acquired: 09/04/2018 Weeks Of Treatment: 0 Clustered Wound: No Photos Wound Measurements Length: (cm) 28 Width: (cm) 40 Depth: (cm) 0.1 Area: (cm) 879.646 Volume: (cm) 87.965 % Reduction in Area: % Reduction in Volume: Epithelialization: Medium (34-66%) Tunneling: No Undermining: No Wound Description Full Thickness Without Exposed Support Classification: Structures Wound Margin: Indistinct, nonvisible Exudate Large Amount: Exudate Type: Purulent Exudate Color: yellow,  brown, green Foul Odor After Cleansing: No Slough/Fibrino Yes Wound Bed Granulation Amount: Medium (34-66%) Exposed Structure Granulation Quality: Red, Hyper-granulation, Friable Fascia Exposed: No Necrotic Amount: Medium (34-66%) Fat  Layer (Subcutaneous Tissue) Exposed: Yes Necrotic Quality: Eschar, Adherent Slough Tendon Exposed: No Muscle Exposed: No Joint Exposed: No Bone Exposed: No Periwound Skin Texture Modi, Joaquin ([id number]) Texture Color No Abnormalities Noted: No No Abnormalities Noted: No Callus: No Atrophie Blanche: No Crepitus: No Cyanosis: No Excoriation: Yes Ecchymosis: No Induration: Yes Erythema: No Rash: No Hemosiderin Staining: No Scarring: Yes Mottled: No Pallor: No Moisture Rubor: Yes No Abnormalities Noted: No Dry / Scaly: No Maceration: No Treatment Notes Wound #2 (Left, Circumferential Lower Leg) Notes ABD, conform Electronic Signature(s) Signed: 10/03/2018 4:43:42 PM By: Gretta Cool, BSN, RN, CWS, Kim RN, BSN Entered By: Gretta Cool, BSN, RN, CWS, Kim on 10/03/2018 09:50:07 [place], [place] ([id number]) -------------------------------------------------------------------------------- Vitals Details Patient Name: Corey James Date of Service: 10/03/2018 8:30 AM Medical Record Number: [id number] Patient Account Number: [id number] Date of Birth/Sex: 04-28-1956 (62 y.o. M) Treating RN: Cornell Barman Primary Care Karessa Onorato: [name] Other Clinician: Referring Tresia Revolorio: Caroleen Hamman Treating Dyneshia Baccam/Extender: STONE III, HOYT Weeks in Treatment: 0 Vital Signs Time Taken: 09:14 Temperature (F): 98 Height (in): 69 Pulse (bpm): 80 Weight (lbs): 336 Respiratory Rate (breaths/min): 18 Body Mass Index (BMI): 49.6 Blood Pressure (mmHg): 167/77 Reference Range: 80 - 120 mg / dl Electronic Signature(s) Signed: 10/03/2018 4:43:42 PM By: Gretta Cool, BSN, RN, CWS, Kim RN, BSN Entered By: Gretta Cool, BSN, RN, CWS, Kim on 10/03/2018 09:15:13

## 2018-10-03 NOTE — ED Notes (Signed)
[place] at bedside working with pt. Will complete EKG & give meds once [place] completed.

## 2018-10-03 NOTE — ED Triage Notes (Signed)
Pt sent from the wound center. Pt has been treating a wound of the LLE since february and now is having foul odor, skin slothing, drainage with redness and swelling. Denies fever. States he is currently on abx.

## 2018-10-03 NOTE — ED Notes (Signed)
Explained delay for floor bed.

## 2018-10-03 NOTE — ED Notes (Signed)
New outter dressing of gauze placed over non-adherent dressing as was removed during [place]. Pulse in L foot 2+. Leg red/irritated-looking.

## 2018-10-04 DIAGNOSIS — Z872 Personal history of diseases of the skin and subcutaneous tissue: Secondary | ICD-10-CM

## 2018-10-04 DIAGNOSIS — Z8619 Personal history of other infectious and parasitic diseases: Secondary | ICD-10-CM

## 2018-10-04 DIAGNOSIS — B9561 Methicillin susceptible Staphylococcus aureus infection as the cause of diseases classified elsewhere: Secondary | ICD-10-CM

## 2018-10-04 DIAGNOSIS — G8929 Other chronic pain: Secondary | ICD-10-CM

## 2018-10-04 DIAGNOSIS — Z880 Allergy status to penicillin: Secondary | ICD-10-CM

## 2018-10-04 DIAGNOSIS — I878 Other specified disorders of veins: Secondary | ICD-10-CM

## 2018-10-04 DIAGNOSIS — I890 Lymphedema, not elsewhere classified: Secondary | ICD-10-CM

## 2018-10-04 DIAGNOSIS — M549 Dorsalgia, unspecified: Secondary | ICD-10-CM

## 2018-10-04 DIAGNOSIS — L03116 Cellulitis of left lower limb: Principal | ICD-10-CM

## 2018-10-04 DIAGNOSIS — I96 Gangrene, not elsewhere classified: Secondary | ICD-10-CM

## 2018-10-04 DIAGNOSIS — B1920 Unspecified viral hepatitis C without hepatic coma: Secondary | ICD-10-CM

## 2018-10-04 DIAGNOSIS — M25551 Pain in right hip: Secondary | ICD-10-CM

## 2018-10-04 DIAGNOSIS — B954 Other streptococcus as the cause of diseases classified elsewhere: Secondary | ICD-10-CM

## 2018-10-04 DIAGNOSIS — F17200 Nicotine dependence, unspecified, uncomplicated: Secondary | ICD-10-CM

## 2018-10-04 MED ORDER — SODIUM CHLORIDE 0.9 % IV SOLN
2.0000 g | INTRAVENOUS | Status: DC
  Administered 2018-10-04 – 2018-10-05 (×2): 2 g via INTRAVENOUS
  Filled 2018-10-04 (×3): qty 20

## 2018-10-04 MED ORDER — CLINDAMYCIN PHOSPHATE 900 MG/50ML IV SOLN
900.0000 mg | Freq: Three times a day (TID) | INTRAVENOUS | Status: DC
  Administered 2018-10-04 – 2018-10-06 (×6): 900 mg via INTRAVENOUS
  Filled 2018-10-04 (×8): qty 50

## 2018-10-04 MED ORDER — GLECAPREVIR-PIBRENTASVIR 100-40 MG PO TABS
3.0000 | ORAL_TABLET | Freq: Every day | ORAL | Status: DC
  Administered 2018-10-04 – 2018-10-06 (×3): 3 via ORAL
  Filled 2018-10-04 (×2): qty 3

## 2018-10-04 NOTE — Consult Note (Signed)
[place] Nurse wound consult note Patient receiving care in [facility].  I have reviewed the patient's record and photo of the LLE. Ultrasound of the extremity yesterday did not discover any DVTs in the leg.   If the providers agree, obtain ABIs for the LLE, and get Vascular and Orthopedic specialities involved.  I have communicated the need for specialists to Dr. Posey Pronto via [place].  Management of this condition (in terms of ordering diagnostic studies, getting specialists involved, etc), exceeds the scope of [place] nursing.  I will order topical therapy, but this will not heal this leg. Reason for Consult: LLE Cellulitis Wound type: Infection in the setting of chronic venous stasis Measurement: The entire LLE from foot to knee is edematous, erythematous, has scattered open areas that are draining. Wound bed: pink and black Drainage (amount, consistency, odor) yellow Periwound: peeling Dressing procedure/placement/frequency: Apply multiple Xeroform gauzes Kellie Simmering [phone]) over entire calf of LLE. Secure with kerlex.  Change daily. Monitor the wound area(s) for worsening of condition such as: Signs/symptoms of infection,  Increase in size,  Development of or worsening of odor, Development of pain, or increased pain at the affected locations.  Notify the medical team if any of these develop.  Thank you for the consult.  Discussed plan of care with the bedside nurse.  [place] nurse will not follow at this time.  Please re-consult the [place] team if needed.  Val Riles, RN, MSN, CWOCN, CNS-BC, pager [phone]

## 2018-10-04 NOTE — Consult Note (Signed)
[facility] Consult Note  MRN : [id number]  Corey James is a 63 y.o. ([birth year]) male who presents with chief complaint of  Chief Complaint  Patient presents with  . Wound Infection  .  History of Present Illness: I am asked to see the patient by Dr. 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  He has had an extensive history of wounds and had a necrotizing soft tissue infection in the left groin treated by General surgery last month.  He was being treated with UNNA boots on the left leg for calf wounds but yesterday when this was changed the leg was very red and he was admitted for cellulitis and IV ABx.  Has been seen by ID.  Question for [place] is whether or not a vascular issue may contribute to these wounds.  No claudication or rest pain symptoms.  Positive for swelling in both legs, worse on the left.  No history of DVT. This area is painful.    Current Facility-Administered Medications  Medication Dose Route Frequency Provider Last Rate Last Dose  . acetaminophen (TYLENOL) tablet 650 mg  650 mg Oral Q6H PRN Fritzi Mandes, MD       Or  . acetaminophen (TYLENOL) suppository 650 mg  650 mg Rectal Q6H PRN Fritzi Mandes, MD      . cefTRIAXone (ROCEPHIN) 2 g in sodium chloride 0.9 % 100 mL IVPB  2 g Intravenous Q24H Fritzi Mandes, MD      . clindamycin (CLEOCIN) IVPB 900 mg  900 mg Intravenous Q8H Ravishankar, Joellyn Quails, MD 100 mL/hr at 10/04/18 1222 900 mg at 10/04/18 1222  . cyclobenzaprine (FLEXERIL) tablet 10 mg  10 mg Oral TID PRN Fritzi Mandes, MD   10 mg at 10/04/18 0852  . docusate sodium (COLACE) capsule 100 mg  100 mg Oral BID Fritzi Mandes, MD   100 mg at 10/04/18 0835  . enoxaparin (LOVENOX) injection 40 mg  40 mg Subcutaneous Q12H Fritzi Mandes, MD   40 mg at 10/04/18 0835  . gabapentin (NEURONTIN) capsule 300 mg  300 mg Oral q morning - 10a Fritzi Mandes, MD   300 mg at 10/04/18 0834  . gabapentin (NEURONTIN) capsule 600 mg  600 mg Oral QHS Fritzi Mandes, MD   600 mg at 10/03/18 2035  . Glecaprevir-Pibrentasvir 100-40 MG TABS 3 tablet  3 tablet Oral Daily Tsosie Billing, MD   3 tablet at 10/04/18 1220  . HYDROcodone-acetaminophen (NORCO) 7.5-325 MG per tablet 1 tablet  1 tablet Oral Q4H PRN Fritzi Mandes, MD   1 tablet at 10/04/18 1226  . ketorolac (TORADOL) 30 MG/ML injection 30 mg  30 mg Intravenous Q6H PRN Fritzi Mandes, MD      . ondansetron [facility]) tablet 4 mg  4 mg Oral Q6H PRN Fritzi Mandes, MD       Or  . ondansetron [facility]) injection 4 mg  4 mg Intravenous Q6H PRN Fritzi Mandes, MD      . polyethylene glycol (MIRALAX / GLYCOLAX) packet 17 g  17 g Oral Daily PRN Fritzi Mandes, MD      . senna (SENOKOT) tablet 8.6 mg  1 tablet Oral BID Fritzi Mandes, MD   8.6 mg at 10/04/18 0834  . sodium chloride flush (NS) 0.9 % injection 3 mL  3 mL Intravenous Once Merlyn Lot, MD        Past Medical History:  Diagnosis Date  . Back pain   . Cellulitis   .  Disc degeneration, lumbosacral   . Hepatitis C     Past Surgical History:  Procedure Laterality Date  . APPLICATION OF WOUND VAC Left 07/14/2018   Procedure: WOUND VAC CHANGE;  Surgeon: Olean Ree, MD;  Location: [facility] ORS;  Service: General;  Laterality: Left;  . INCISION AND DRAINAGE ABSCESS Left 07/11/2018   Procedure: INCISION AND DRAINAGE ABSCESS-LEFT THIGH;  Surgeon: [name], MD;  Location: [facility] ORS;  Service: General;  Laterality: Left;  . WOUND DEBRIDEMENT Left 07/14/2018   Procedure: POSSIBLE DEBRIDEMENT WOUND-LEFT THIGH;  Surgeon: Olean Ree, MD;  Location: [facility] ORS;  Service: General;  Laterality: Left;    Social History Social History   Tobacco Use  . Smoking status: Current Every Day Smoker    Packs/day: 1.00    Years: 40.00    Pack years: 40.00    Types: Cigarettes    Start date: 09/06/2018  . Smokeless tobacco: Current User  . Tobacco comment: Wellbutrin  Substance Use Topics  . Alcohol use: Yes    Comment: 1/5 of vodka per month   . Drug use:  Not Currently    Types: Marijuana    Family History Family History  Problem Relation Age of Onset  . Dementia Mother   . Alzheimer's disease Mother   . CVA Father   . Cancer - Colon Father   . Heart attack Brother   . Hypertension Brother     Allergies  Allergen Reactions  . Penicillins Itching and Rash    Did it involve swelling of the face/tongue/throat, SOB, or low BP? Unknown Did it involve sudden or severe rash/hives, skin peeling, or any reaction on the inside of your mouth or nose? Yes Did you need to seek medical attention at a hospital or doctor's office? Unknown When did it last happen?Unknown If all above answers are "NO", may proceed with cephalosporin use.      REVIEW OF SYSTEMS (Negative unless checked)  Constitutional: [] Weight loss  [] Fever  [] Chills Cardiac: [] Chest pain   [] Chest pressure   [] Palpitations   [] Shortness of breath when laying flat   [] Shortness of breath at rest   [] Shortness of breath with exertion. Vascular:  [] Pain in legs with walking   [] Pain in legs at rest   [] Pain in legs when laying flat   [] Claudication   [] Pain in feet when walking  [] Pain in feet at rest  [] Pain in feet when laying flat   [] History of DVT   [] Phlebitis   [x] Swelling in legs   [] Varicose veins   [x] Non-healing ulcers Pulmonary:   [] Uses home oxygen   [] Productive cough   [] Hemoptysis   [] Wheeze  [] COPD   [] Asthma Neurologic:  [] Dizziness  [] Blackouts   [] Seizures   [] History of stroke   [] History of TIA  [] Aphasia   [] Temporary blindness   [] Dysphagia   [] Weakness or numbness in arms   [] Weakness or numbness in legs Musculoskeletal:  [x] Arthritis   [] Joint swelling   [] Joint pain   [x] Low back pain Hematologic:  [] Easy bruising  [] Easy bleeding   [] Hypercoagulable state   [] Anemic  [x] Hepatitis Gastrointestinal:  [] Blood in stool   [] Vomiting blood  [] Gastroesophageal reflux/heartburn   [] Difficulty swallowing. Genitourinary:  [] Chronic kidney disease    [] Difficult urination  [] Frequent urination  [] Burning with urination   [] Blood in urine Skin:  [] Rashes   [x] Ulcers   [x] Wounds Psychological:  [] History of anxiety   []  History of major depression.  Physical Examination  Vitals:   10/03/18  1920 10/03/18 2002 10/04/18 0449 10/04/18 1209  BP:  (!) 152/74 134/76 (!) 113/53  Pulse:  85 (!) 109 85  Resp:  20 20 20   Temp:  97.7 F (36.5 C) 97.7 F (36.5 C) 97.6 F (36.4 C)  TempSrc:  Oral Oral Oral  SpO2: 95% 97% 92% 97%  Weight:      Height:       Body mass index is 49.03 kg/m. Gen:  WD/WN, NAD. Morbidly obese Head: [place]/AT, No temporalis wasting.  Ear/Nose/Throat: Hearing grossly intact, nares w/o erythema or drainage, oropharynx w/o Erythema/Exudate Eyes: Sclera non-icteric, conjunctiva clear Neck: Trachea midline.  No JVD.  Pulmonary:  Good air movement, respirations not labored, equal bilaterally.  Cardiac: RRR, normal S1, S2. Vascular:  Vessel Right Left  Radial Palpable Palpable                          PT Palpable Palpable  DP Palpable Palpable   Gastrointestinal: soft, non-tender/non-distended.  Musculoskeletal: M/S 5/5 throughout.  Extremities without ischemic changes.  1+ RLE swelling, 2+ LLE swelling.  Patient asks that I not remove the dressings as they have been changed three times today and are very painful.  Have seen pictures in the computer. Neurologic: Sensation grossly intact in extremities.  Symmetrical.  Speech is fluent. Motor exam as listed above. Psychiatric: Judgment intact, Mood & affect appropriate for pt's clinical situation. Dermatologic: Left leg wounds dressed      CBC Lab Results  Component Value Date   WBC 13.9 (H) 10/03/2018   HGB 14.9 10/03/2018   HCT 46.9 10/03/2018   MCV 95.7 10/03/2018   PLT 254 10/03/2018    BMET    Component Value Date/Time   NA 138 10/03/2018 1315   K 3.9 10/03/2018 1315   CL 101 10/03/2018 1315   CO2 27 10/03/2018 1315   GLUCOSE 138 (H)  10/03/2018 1315   BUN 16 10/03/2018 1315   CREATININE 1.13 10/03/2018 1315   CREATININE 0.90 09/15/2018 0811   CALCIUM 9.1 10/03/2018 1315   GFRNONAA >60 10/03/2018 1315   GFRNONAA 92 09/15/2018 0811   GFRAA >60 10/03/2018 1315   GFRAA 106 09/15/2018 0811   Estimated Creatinine Clearance: 99.7 mL/min (by C-G formula based on SCr of 1.13 mg/dL).  COAG No results found for: INR, PROTIME  Radiology Dg Chest 1 View  Result Date: 09/07/2018 CLINICAL DATA:  Lower extremity swelling.  Cellulitis.  Tobacco use. EXAM: CHEST  1 VIEW COMPARISON:  September 04, 2018 FINDINGS: There is no appreciable edema or consolidation. Heart is upper normal in size with pulmonary vascularity normal. No adenopathy. There is degenerative change in the thoracic spine. IMPRESSION: No edema or consolidation.  Heart upper normal in size. Electronically Signed   By: Lowella Grip III M.D.   On: 09/07/2018 09:02   Ct Tibia Fibula Left W Contrast  Result Date: 09/07/2018 CLINICAL DATA:  Worsening left leg cellulitis and leukocytosis. EXAM: CT OF THE LOWER LEFT EXTREMITY WITH CONTRAST TECHNIQUE: Multidetector CT imaging of the lower right extremity was performed according to the standard protocol following intravenous contrast administration. COMPARISON:  CT left femur from 2 days ago and 07/10/2018. CONTRAST:  137mL OMNIPAQUE IOHEXOL 300 MG/ML  SOLN FINDINGS: Bones/Joint/Cartilage No acute fracture or dislocation. No osseous destruction or periosteal reaction. Mild degenerative changes of the left hip and knee joints again noted. Mild posterior subtalar osteoarthritis. Bone mineralization is normal. No joint effusion. Ligaments Suboptimally assessed by CT. Muscles  and Tendons Intact. Soft tissues Skin thickening and subcutaneous soft tissue swelling of the mid to distal thigh have slightly worsened, especially along the lateral aspect. There is prominent circumferential skin thickening and subcutaneous soft tissue swelling of  the lower leg extending into the dorsal foot. No fluid collection or soft tissue mass. No subcutaneous emphysema. Mildly enlarged left inguinal lymph nodes are unchanged and remain likely reactive. IMPRESSION: 1. Findings consistent with cellulitis of the left leg. Skin thickening and subcutaneous soft tissue swelling of the mid to distal thigh has slightly worsened when compared to prior study from two days ago. 2. No abscess. 3.  No acute osseous abnormality. Electronically Signed   By: Titus Dubin M.D.   On: 09/07/2018 11:35   Ct Femur Left W Contrast  Result Date: 09/07/2018 CLINICAL DATA:  Worsening left leg cellulitis and leukocytosis. EXAM: CT OF THE LOWER LEFT EXTREMITY WITH CONTRAST TECHNIQUE: Multidetector CT imaging of the lower right extremity was performed according to the standard protocol following intravenous contrast administration. COMPARISON:  CT left femur from 2 days ago and 07/10/2018. CONTRAST:  132mL OMNIPAQUE IOHEXOL 300 MG/ML  SOLN FINDINGS: Bones/Joint/Cartilage No acute fracture or dislocation. No osseous destruction or periosteal reaction. Mild degenerative changes of the left hip and knee joints again noted. Mild posterior subtalar osteoarthritis. Bone mineralization is normal. No joint effusion. Ligaments Suboptimally assessed by CT. Muscles and Tendons Intact. Soft tissues Skin thickening and subcutaneous soft tissue swelling of the mid to distal thigh have slightly worsened, especially along the lateral aspect. There is prominent circumferential skin thickening and subcutaneous soft tissue swelling of the lower leg extending into the dorsal foot. No fluid collection or soft tissue mass. No subcutaneous emphysema. Mildly enlarged left inguinal lymph nodes are unchanged and remain likely reactive. IMPRESSION: 1. Findings consistent with cellulitis of the left leg. Skin thickening and subcutaneous soft tissue swelling of the mid to distal thigh has slightly worsened when  compared to prior study from two days ago. 2. No abscess. 3.  No acute osseous abnormality. Electronically Signed   By: Titus Dubin M.D.   On: 09/07/2018 11:35   Ct Femur Left W Contrast  Result Date: 09/05/2018 CLINICAL DATA:  History of left thigh abscess with cellulitis EXAM: CT OF THE LOWER RIGHT EXTREMITY WITH CONTRAST TECHNIQUE: Multidetector CT imaging of the lower right extremity was performed according to the standard protocol following intravenous contrast administration. COMPARISON:  07/10/2018 CONTRAST:  141mL OMNIPAQUE 300 FINDINGS: Bones/Joint/Cartilage Degenerative changes of the lumbar spine and left sacroiliac joint seen. No acute fracture or dislocation is noted. No bony erosive changes are seen. Ligaments Suboptimally assessed by CT. Muscles and Tendons Muscular structures are within normal limits. Soft tissues Persistent soft tissue edema is noted in the anteromedial left thigh but significantly improved when compared with the prior exam. No demonstrable fluid collection is noted at this time. Mild skin thickening is noted along the medial thigh as well. Mild reactive lymph nodes are noted in the left inguinal region. IMPRESSION: Residual soft tissue changes related to the prior abscess. No demonstrable fluid collection is seen. The overall skin thickening has improved when compared with the prior study. Mild reactive lymph nodes are noted in the left inguinal region. No underlying bony abnormality is noted. Electronically Signed   By: Inez Catalina M.D.   On: 09/05/2018 03:48   Ct Foot Left W Contrast  Result Date: 09/07/2018 CLINICAL DATA:  Worsening left leg cellulitis and leukocytosis. EXAM: CT OF THE LOWER  LEFT EXTREMITY WITH CONTRAST TECHNIQUE: Multidetector CT imaging of the lower right extremity was performed according to the standard protocol following intravenous contrast administration. COMPARISON:  CT left femur from 2 days ago and 07/10/2018. CONTRAST:  120mL OMNIPAQUE  IOHEXOL 300 MG/ML  SOLN FINDINGS: Bones/Joint/Cartilage No acute fracture or dislocation. No osseous destruction or periosteal reaction. Mild degenerative changes of the left hip and knee joints again noted. Mild posterior subtalar osteoarthritis. Bone mineralization is normal. No joint effusion. Ligaments Suboptimally assessed by CT. Muscles and Tendons Intact. Soft tissues Skin thickening and subcutaneous soft tissue swelling of the mid to distal thigh have slightly worsened, especially along the lateral aspect. There is prominent circumferential skin thickening and subcutaneous soft tissue swelling of the lower leg extending into the dorsal foot. No fluid collection or soft tissue mass. No subcutaneous emphysema. Mildly enlarged left inguinal lymph nodes are unchanged and remain likely reactive. IMPRESSION: 1. Findings consistent with cellulitis of the left leg. Skin thickening and subcutaneous soft tissue swelling of the mid to distal thigh has slightly worsened when compared to prior study from two days ago. 2. No abscess. 3.  No acute osseous abnormality. Electronically Signed   By: Titus Dubin M.D.   On: 09/07/2018 11:35   US Venous Img Lower Unilateral Left  Result Date: 10/03/2018 CLINICAL DATA:  Left lower extremity pain and edema for the past 1.5 weeks. History of smoking. Evaluate for DVT. EXAM: LEFT LOWER EXTREMITY VENOUS DOPPLER ULTRASOUND TECHNIQUE: Gray-scale sonography with graded compression, as well as color Doppler and duplex ultrasound were performed to evaluate the lower extremity deep venous systems from the level of the common femoral vein and including the common femoral, femoral, profunda femoral, popliteal and calf veins including the posterior tibial, peroneal and gastrocnemius veins when visible. The superficial great saphenous vein was also interrogated. Spectral Doppler was utilized to evaluate flow at rest and with distal augmentation maneuvers in the common femoral, femoral  and popliteal veins. COMPARISON:  None. FINDINGS: Examination degraded due to patient body habitus and poor sonographic window. Contralateral Common Femoral Vein: Respiratory phasicity is normal and symmetric with the symptomatic side. No evidence of thrombus. Normal compressibility. Common Femoral Vein: No evidence of thrombus. Normal compressibility, respiratory phasicity and response to augmentation. Saphenofemoral Junction: No evidence of thrombus. Normal compressibility and flow on color Doppler imaging. Profunda Femoral Vein: No evidence of thrombus. Normal compressibility and flow on color Doppler imaging. Femoral Vein: No evidence of thrombus. Normal compressibility, respiratory phasicity and response to augmentation. Popliteal Vein: No evidence of thrombus. Normal compressibility, respiratory phasicity and response to augmentation. Calf Veins: Appear patent where imaged. Superficial Great Saphenous Vein: No evidence of thrombus. Normal compressibility. Venous Reflux:  None. Other Findings:  None. IMPRESSION: No evidence of DVT within the left lower extremity. Electronically Signed   By: Sandi Mariscal M.D.   On: 10/03/2018 16:35   US Venous Img Lower Unilateral Left  Result Date: 09/05/2018 CLINICAL DATA:  Cellulitis, redness and swelling x3 days, pain. Previous tobacco abuse. EXAM: LEFT LOWER EXTREMITY VENOUS DOPPLER ULTRASOUND TECHNIQUE: Gray-scale sonography with compression, as well as color and duplex ultrasound, were performed to evaluate the deep venous system from the level of the common femoral vein through the popliteal and proximal calf veins. COMPARISON:  None FINDINGS: Normal compressibility of the common femoral, superficial femoral, and popliteal veins, as well as the proximal calf veins. No filling defects to suggest DVT on grayscale or color Doppler imaging. Doppler waveforms show normal direction of venous  flow, normal respiratory phasicity and response to augmentation. Survey views of  the contralateral common femoral vein are unremarkable. IMPRESSION: No femoropopliteal and no calf DVT in the visualized calf veins. If clinical symptoms are inconsistent or if there are persistent or worsening symptoms, further imaging (possibly involving the iliac veins) may be warranted. Electronically Signed   By: Lucrezia Europe M.D.   On: 09/05/2018 12:18   Dg Chest Port 1 View  Result Date: 09/04/2018 CLINICAL DATA:  63 y/o M; fever and cellulitis of the left lower extremity. EXAM: PORTABLE CHEST 1 VIEW COMPARISON:  07/06/2018 chest radiograph FINDINGS: Cardiac silhouette within normal limits given projection and technique. No consolidation, effusion, or pneumothorax. Multilevel degenerative changes of the thoracic spine. No acute osseous abnormality is evident. IMPRESSION: No active disease. Electronically Signed   By: Kristine Garbe M.D.   On: 09/04/2018 22:51   Dg Femur Min 2 Views Left  Result Date: 09/04/2018 CLINICAL DATA:  63 y/o M; fever and cellulitis of left lower extremity. EXAM: LEFT FEMUR 2 VIEWS COMPARISON:  None. FINDINGS: There is no evidence of fracture or other focal bone lesions. Narrowing of the left knee joint medial femorotibial compartment. Left hip joint is well maintained. IMPRESSION: No acute bony or articular abnormality. Electronically Signed   By: Kristine Garbe M.D.   On: 09/04/2018 22:53      Assessment/Plan 1. LLE swelling/wounds/cellulitis.  Being treated with IV ABx.  Has some improvement while here with legs elevated.  He has strongly palpable pedal pulses and arterial insufficiency is clearly not the issue.  Venous disease could be and this would have to be evaluated as an outpatient as adequate reflux studies are not available at our hospital.  Would recommend using UNNA boots and elevating the legs as much as possible when stable from infectious standpoint 2. Morbid obesity.  Weight is clearing increasing pressure on the legs and weight loss  would be of great benefit. 3. 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  Gen surgery seeing and stable. 4.  Lymphedema.  Almost certainly has a component of lymphedema after the severe infection and given his size.  Compression/elevation are the mainstays of treatment.  Once his wound issues are resolved, would evaluate in the office for a lymphedema pump.   Leotis Pain, MD  10/04/2018 4:57 PM    This note was created with Dragon medical transcription system.  Any error is purely unintentional

## 2018-10-04 NOTE — TOC Initial Note (Signed)
Transition of Care [facility]) - Initial/Assessment Note    Patient Details  Name: Corey James MRN: [id number] Date of Birth: Apr 03, 1956  [facility]) CM/SW Contact:    Beverly Sessions, RN Phone Number: 10/04/2018, 2:23 PM  Clinical Narrative:                 Patient admitted to [facility] for cellulitis.  Patient lives at home with spouse.  Patient self pay.  Patient was denied Medicaid in December of 2019.  Patient states that he will be reapplying for medicaid.    PCP is [name].  Patient is current with PCP.  Patient is followed by a case manager from their office.  Pharmacy [facility].  Patient typically uses goodrx. Coupon  Patient is not a candidate for [facility] due to being a resident of [place].  Patient case manager from PCP has contacted [facility] however they are not accepting new patients due to corona.   Patient was recently closed with [facility] health through [place] on 4/29.  Per documentation it is not expected that patient will require long term IV antibiotics.  Patient potentially would benefit from charity home health services for wound care.  Patient agreeable if indicated.    RNCM following for medication needs at discharge.    Expected Discharge Plan: [place] Barriers to Discharge: Continued Medical Work up   Patient Goals and CMS Choice Patient states their goals for this hospitalization and ongoing recovery are:: I want to go home      Expected Discharge Plan and Services Expected Discharge Plan: [place]   Discharge Planning Services: CM Consult                                          Prior Living Arrangements/Services   Lives with:: Spouse Patient language and need for interpreter reviewed:: No Do you feel safe going back to the place where you live?: Yes      Need for Family Participation in Patient Care: Yes (Comment) Care giver  support system in place?: Yes (comment)      Activities of Daily Living Home Assistive Devices/Equipment: Eyeglasses ADL Screening (condition at time of admission) Patient's cognitive ability adequate to safely complete daily activities?: Yes Is the patient deaf or have difficulty hearing?: No Does the patient have difficulty seeing, even when wearing glasses/contacts?: No Does the patient have difficulty concentrating, remembering, or making decisions?: No Patient able to express need for assistance with ADLs?: Yes Does the patient have difficulty dressing or bathing?: No Independently performs ADLs?: Yes (appropriate for developmental age) Does the patient have difficulty walking or climbing stairs?: Yes Weakness of Legs: None Weakness of Arms/Hands: None  Permission Sought/Granted                  Emotional Assessment Appearance:: Appears stated age     Orientation: : Oriented to Self, Oriented to Place, Oriented to  Time, Oriented to Situation   Psych Involvement: No (comment)  Admission diagnosis:  Cellulitis of left lower extremity [L03.116] Patient Active Problem List   Diagnosis Date Noted  . Cellulitis 10/03/2018  . Sepsis ([place]) 09/05/2018  . Chronic bilateral low back pain with right-sided sciatica 08/31/2018  . Class 3 severe obesity due to excess calories with serious comorbidity and body mass  index (BMI) of 50.0 to 59.9 in adult [facility]) 08/31/2018  . Nocturia 08/31/2018  . Necrotizing soft tissue infection   . Abscess of left lower extremity   . Thigh abscess 07/06/2018  . Arthritis of hip 06/09/2018  . DDD (degenerative disc disease), lumbar 06/09/2018  . Morbid obesity with BMI of 45.0-49.9, adult ([place]) 06/09/2018   PCP:  [name], FNP Pharmacy[facility]   [id number] [place], [place] [address] Phone: [phone] Fax: [phone]     Social Determinants of Health  (SDOH) Interventions    Readmission Risk Interventions No flowsheet data found.

## 2018-10-04 NOTE — Progress Notes (Addendum)
Pt called RN to come to his room. Pt educated that RN would come to the room when she was finished with her current patient. When RN arrived to the pt room, the pt had disconnected his IV line from the IV. Pt educated on issues with infection control and safety protocols in place and requested not to disconnect or otherwise tamper with the IV. Pt responded "I'll do what I want to do. You took to long". Pt educated that this RN has several patient's and cannot leave one patient in order to come to another patients room. RN educated patient that she arrived to his room as soon as she was able. Pt again stated that he "I will do what he wanted with my IV". This RN flushed the IV and found it to be leaking and painful. Pt educated that a new IV would have to be placed. Pt stated he did not want a new IV. Pt educated on the plan of care for this hospitalization including IV abx. Pt became belligerent with this RN yelling "I'm going to do what I want to, it's none of your business. When am I going to get out of here?" Pt educated that he was welcome to leave AMA and this RN would notify the MD and provide the pt with AMA paperwork if he would like it. Pt stated he was going to stay. Pt requested by this RN to please comply with the requests of the nursing staff and the safety/infections protocols in place. Pt verbalizes understanding and states "I will do what you say". IV team consulted for new IV. CN notified of same.

## 2018-10-04 NOTE — Consult Note (Signed)
NAME: Corey James  DOB: [birth year]  MRN: [id number]  Date/Time: 10/04/2018 9:58 AM  REQUESTING PROVIDER: Posey Pronto Subjective:  REASON FOR CONSULT: Left leg cellulitis ? Corey James is a 63 y.o.male  with a history of hepatitis C, left leg cellulitis, left groin nec fascitis is admitted from the wound clinic because of worsening left leg cellulitis.   Pt has been battling left leg infection since feb 2020.  He was initially hospitalized at [facility] between February 13 until July 15, 2018 for about necrotizing soft tissue infection of the left groin and underwent surgery. See pictures below     Cultures from surgical specimen then was staph aureus and Streptococcus C.  He got intravenous ceftriaxone and clindamycin while in the hospital and sent home on wound VAC and  p.o. Omnicef and Flagyl for 10 days.  Not sure what his compliance with antibiotic was at that time.  He was being followed by surgeon as outpatient.   He was readmitted to the hospital on 09/04/2018 with cellulitis of the left thigh as well as the leg.  During that hospitalization he had a temperature of 101.5 WBC was 13.5 with creatinine of 1.07.  Blood culture and wound cultures were sent and he was started on Vanco and ceftriaxone.        The groin wound has almost healed but the leg had cellulitis.  The culture from the small groin wound had a Streptococcus C.  He was always in a rush to be discharged but stayed until 09/10/2018 and was discharged home on oral which was cephalexin and clindamycin for 10 days.  Was asked to follow-up with surgeon as outpatient. Since then he has been followed by Dr.pabon and on 09/25/2018 he saw him in his clinic and it was felt that this left thigh soft tissue infection had resolved he had then developed superficial ulceration to the left lower leg due to venous stasis.Corey James  He was placed on a Unna boot and was prescribed clindamycin.  He was also asked to follow-up with the wound clinic and with me  as outpatient.  He went to the wound clinic yesterday and when they remove the Unna boot they found the leg was red and they asked him to get admitted to the hospital.  Patient does not have any fever.  He has recently started taking treatment for hepatitis C with glecaprevir/pibrentasvir. He does not have any cough or shortness of breath or abdominal pain or diarrhea or urinary symptoms. Past Medical History:  Diagnosis Date  . Back pain   . Cellulitis   . Disc degeneration, lumbosacral   . Hepatitis C     Past Surgical History:  Procedure Laterality Date  . APPLICATION OF WOUND VAC Left 07/14/2018   Procedure: WOUND VAC CHANGE;  Surgeon: Olean Ree, MD;  Location: [facility] ORS;  Service: General;  Laterality: Left;  . INCISION AND DRAINAGE ABSCESS Left 07/11/2018   Procedure: INCISION AND DRAINAGE ABSCESS-LEFT THIGH;  Surgeon: [name], MD;  Location: [facility] ORS;  Service: General;  Laterality: Left;  . WOUND DEBRIDEMENT Left 07/14/2018   Procedure: POSSIBLE DEBRIDEMENT WOUND-LEFT THIGH;  Surgeon: Olean Ree, MD;  Location: [facility] ORS;  Service: General;  Laterality: Left;    Social history Current smoker  alcohol intermittently. Family History  Problem Relation Age of Onset  . Dementia Mother   . Alzheimer's disease Mother   . CVA Father   . Cancer - Colon Father   . Heart attack Brother   .  Hypertension Brother    Allergies  Allergen Reactions  . Penicillins Itching and Rash    Did it involve swelling of the face/tongue/throat, SOB, or low BP? Unknown Did it involve sudden or severe rash/hives, skin peeling, or any reaction on the inside of your mouth or nose? Yes Did you need to seek medical attention at a hospital or doctor's office? Unknown When did it last happen?Unknown If all above answers are "NO", may proceed with cephalosporin use.     ? Current Facility-Administered Medications  Medication Dose Route Frequency Provider Last Rate Last Dose  .  acetaminophen (TYLENOL) tablet 650 mg  650 mg Oral Q6H PRN Fritzi Mandes, MD       Or  . acetaminophen (TYLENOL) suppository 650 mg  650 mg Rectal Q6H PRN Fritzi Mandes, MD      . cefTRIAXone (ROCEPHIN) 2 g in sodium chloride 0.9 % 100 mL IVPB  2 g Intravenous Q24H Fritzi Mandes, MD      . cyclobenzaprine (FLEXERIL) tablet 10 mg  10 mg Oral TID PRN Fritzi Mandes, MD   10 mg at 10/04/18 7824  . docusate sodium (COLACE) capsule 100 mg  100 mg Oral BID Fritzi Mandes, MD   100 mg at 10/04/18 0835  . enoxaparin (LOVENOX) injection 40 mg  40 mg Subcutaneous Q12H Fritzi Mandes, MD   40 mg at 10/04/18 0835  . gabapentin (NEURONTIN) capsule 300 mg  300 mg Oral q morning - 10a Fritzi Mandes, MD   300 mg at 10/04/18 0834  . gabapentin (NEURONTIN) capsule 600 mg  600 mg Oral QHS Fritzi Mandes, MD   600 mg at 10/03/18 2035  . HYDROcodone-acetaminophen (NORCO) 7.5-325 MG per tablet 1 tablet  1 tablet Oral Q4H PRN Fritzi Mandes, MD   1 tablet at 10/04/18 0512  . ketorolac (TORADOL) 30 MG/ML injection 30 mg  30 mg Intravenous Q6H PRN Fritzi Mandes, MD      . ondansetron [facility]) tablet 4 mg  4 mg Oral Q6H PRN Fritzi Mandes, MD       Or  . ondansetron [facility]) injection 4 mg  4 mg Intravenous Q6H PRN Fritzi Mandes, MD      . polyethylene glycol (MIRALAX / GLYCOLAX) packet 17 g  17 g Oral Daily PRN Fritzi Mandes, MD      . senna (SENOKOT) tablet 8.6 mg  1 tablet Oral BID Fritzi Mandes, MD   8.6 mg at 10/04/18 0834  . sodium chloride flush (NS) 0.9 % injection 3 mL  3 mL Intravenous Once Merlyn Lot, MD      . vancomycin (VANCOCIN) 1,250 mg in sodium chloride 0.9 % 250 mL IVPB  1,250 mg Intravenous Q12H Hallaji, Sheema M, RPH 166.7 mL/hr at 10/04/18 0600       Abtx:  Anti-infectives (From admission, onward)   Start     Dose/Rate Route Frequency Ordered Stop   10/04/18 2200  cefTRIAXone (ROCEPHIN) 2 g in sodium chloride 0.9 % 100 mL IVPB     2 g 200 mL/hr over 30 Minutes Intravenous Every 24 hours 10/04/18 0915     10/04/18 1000   cefTRIAXone (ROCEPHIN) 1 g in sodium chloride 0.9 % 100 mL IVPB  Status:  Discontinued     1 g 200 mL/hr over 30 Minutes Intravenous Every 24 hours 10/03/18 2325 10/04/18 0915   10/04/18 0500  vancomycin (VANCOCIN) 1,250 mg in sodium chloride 0.9 % 250 mL IVPB     1,250 mg 166.7 mL/hr over 90 Minutes  Intravenous Every 12 hours 10/03/18 2327     10/03/18 1630  vancomycin (VANCOCIN) 1,500 mg in sodium chloride 0.9 % 500 mL IVPB     1,500 mg 250 mL/hr over 120 Minutes Intravenous  Once 10/03/18 1622 10/03/18 1923   10/03/18 1545  vancomycin (VANCOCIN) 1,500 mg in sodium chloride 0.9 % 500 mL IVPB  Status:  Discontinued     1,500 mg 250 mL/hr over 120 Minutes Intravenous  Once 10/03/18 1533 10/03/18 1622   10/03/18 1545  cefTRIAXone (ROCEPHIN) 2 g in sodium chloride 0.9 % 100 mL IVPB     2 g 200 mL/hr over 30 Minutes Intravenous  Once 10/03/18 1533 10/03/18 1714      REVIEW OF SYSTEMS:  Const: negative fever, negative chills, negative weight loss Eyes: negative diplopia or visual changes, negative eye pain ENT: negative coryza, negative sore throat Resp: negative cough, hemoptysis, dyspnea Cards: negative for chest pain, palpitations, lower extremity edema GU: negative for frequency, dysuria and hematuria GI: Negative for abdominal pain, diarrhea, bleeding, constipation Skin: Left leg ulceration Heme: negative for easy bruising and gum/nose bleeding MS: Has chronic back pain and right hip pain Neurolo:negative for headaches, dizziness, vertigo, memory problems  Psych: negative for feelings of anxiety, depression  Endocrine: negative for thyroid, diabetes Allergy/Immunology-penicillin allergy Objective:  VITALS:  BP 134/76 (BP Location: Right Arm)   Pulse (!) 109   Temp 97.7 F (36.5 C) (Oral)   Resp 20   Ht 5\' 9"  (1.753 m)   Wt (!) 150.6 kg   SpO2 92%   BMI 49.03 kg/m  PHYSICAL EXAM:  General: Alert, cooperative, no distress, appears stated age.  Morbid obesity Head:  Normocephalic, without obvious abnormality, atraumatic. Eyes: Conjunctivae clear, anicteric sclerae. Pupils are equal ENT Nares normal. No drainage or sinus tenderness. Lips, mucosa, and tongue normal. No Thrush Neck: Supple, symmetrical, no adenopathy, thyroid: non tender no carotid bruit and no JVD. Back: No CVA tenderness. Lungs: Clear to auscultation bilaterally. No Wheezing or Rhonchi. No rales. Heart: Regular rate and rhythm, no murmur, rub or gallop. Abdomen: Soft, non-tender,not distended. Bowel sounds normal. No masses Extremities: Left foot swollen compared to right Erythema below the knee Superficial ulceration below the knees multiple          The wound on the groin flexure is clean with red base but has a suture in the middle.  It is moist skin: As above lymph: Cervical, supraclavicular normal. Neurologic: Grossly non-focal Pertinent Labs Lab Results CBC    Component Value Date/Time   WBC 13.9 (H) 10/03/2018 1315   RBC 4.90 10/03/2018 1315   HGB 14.9 10/03/2018 1315   HCT 46.9 10/03/2018 1315   PLT 254 10/03/2018 1315   MCV 95.7 10/03/2018 1315   MCH 30.4 10/03/2018 1315   MCHC 31.8 10/03/2018 1315   RDW 12.7 10/03/2018 1315   LYMPHSABS 1.7 10/03/2018 1315   MONOABS 0.6 10/03/2018 1315   EOSABS 0.3 10/03/2018 1315   BASOSABS 0.1 10/03/2018 1315    CMP Latest Ref Rng & Units 10/03/2018 09/15/2018 09/07/2018  Glucose 70 - 99 mg/dL 138(H) 99 137(H)  BUN 8 - 23 mg/dL 16 11 16   Creatinine 0.61 - 1.24 mg/dL 1.13 0.90 0.88  Sodium 135 - 145 mmol/L 138 135 132(L)  Potassium 3.5 - 5.1 mmol/L 3.9 4.8 3.8  Chloride 98 - 111 mmol/L 101 100 99  CO2 22 - 32 mmol/L 27 28 25   Calcium 8.9 - 10.3 mg/dL 9.1 8.5(L) 8.0(L)  Total Protein 6.5 -  8.1 g/dL 8.9(H) - -  Total Bilirubin 0.3 - 1.2 mg/dL 0.4 - -  Alkaline Phos 38 - 126 U/L 88 - -  AST 15 - 41 U/L 14(L) - -  ALT 0 - 44 U/L 18 - -      Microbiology: Recent Results (from the past 240 hour(s))  Blood culture  (routine x 2)     Status: None (Preliminary result)   Collection Time: 10/03/18  1:15 PM  Result Value Ref Range Status   Specimen Description BLOOD LEFT ANTECUBITAL  Final   Special Requests   Final    BOTTLES DRAWN AEROBIC AND ANAEROBIC Blood Culture adequate volume   Culture   Final    NO GROWTH < 24 HOURS Performed at [facility], [address] [place], [place] [address]    Report Status PENDING  Incomplete  Blood culture (routine x 2)     Status: None (Preliminary result)   Collection Time: 10/03/18  3:06 PM  Result Value Ref Range Status   Specimen Description BLOOD BLOOD RIGHT HAND  Final   Special Requests   Final    BOTTLES DRAWN AEROBIC AND ANAEROBIC Blood Culture results may not be optimal due to an excessive volume of blood received in culture bottles   Culture   Final    NO GROWTH < 24 HOURS Performed at [facility], [address], [place], [place] [address]    Report Status PENDING  Incomplete  SARS Coronavirus 2 (CEPHEID - Performed in [facility] hospital lab), Hosp Order     Status: None   Collection Time: 10/03/18  4:45 PM  Result Value Ref Range Status   SARS Coronavirus 2 NEGATIVE NEGATIVE Final    Comment: (NOTE) If result is NEGATIVE SARS-CoV-2 target nucleic acids are NOT DETECTED. The SARS-CoV-2 RNA is generally detectable in upper and lower  respiratory specimens during the acute phase of infection. The lowest  concentration of SARS-CoV-2 viral copies this assay can detect is 250  copies / mL. A negative result does not preclude SARS-CoV-2 infection  and should not be used as the sole basis for treatment or other  patient management decisions.  A negative result may occur with  improper specimen collection / handling, submission of specimen other  than nasopharyngeal swab, presence of viral mutation(s) within the  areas targeted by this assay, and inadequate number of viral copies  (<250 copies / mL). A negative result must be  combined with clinical  observations, patient history, and epidemiological information. If result is POSITIVE SARS-CoV-2 target nucleic acids are DETECTED. The SARS-CoV-2 RNA is generally detectable in upper and lower  respiratory specimens dur ing the acute phase of infection.  Positive  results are indicative of active infection with SARS-CoV-2.  Clinical  correlation with patient history and other diagnostic information is  necessary to determine patient infection status.  Positive results do  not rule out bacterial infection or co-infection with other viruses. If result is PRESUMPTIVE POSTIVE SARS-CoV-2 nucleic acids MAY BE PRESENT.   A presumptive positive result was obtained on the submitted specimen  and confirmed on repeat testing.  While 2019 novel coronavirus  (SARS-CoV-2) nucleic acids may be present in the submitted sample  additional confirmatory testing may be necessary for epidemiological  and / or clinical management purposes  to differentiate between  SARS-CoV-2 and other Sarbecovirus currently known to infect humans.  If clinically indicated additional testing with an alternate test  methodology [phone]) is advised. The SARS-CoV-2  RNA is generally  detectable in upper and lower respiratory sp ecimens during the acute  phase of infection. The expected result is Negative. Fact Sheet for Patients:  StrictlyIdeas.no Fact Sheet for Healthcare Providers: BankingDealers.co.za This test is not yet approved or cleared by the [place] FDA and has been authorized for detection and/or diagnosis of SARS-CoV-2 by FDA under an Emergency Use Authorization (EUA).  This EUA will remain in effect (meaning this test can be used) for the duration of the COVID-19 declaration under Section 564(b)(1) of the Act, 21 U.S.C. section 360bbb-3(b)(1), unless the authorization is terminated or revoked sooner. Performed at [facility], [address] [place], [place] [address]     IMAGING RESULTS: [place] leg -No DVT I have personally reviewed the films ? Impression/Recommendation ?63 yr male with morbid obesity, alcohol use, smoker who recently had a necrotizing soft tissue infection of the left thigh surgically debrided in Feb 2020 and had a wound vac till 09/02/18, followed by left leg cellulitis for which he has been on oral antibiotics Presents with redness, swelling ulceration of the left leg below the knee.  Recurrent and ongoing cellulitis of the left leg due to venous edema , stasis , and now superficial ulceration. Started with a left thigh , groin infection in feb with intermittent flare ups. Previously had MSSA and group C strep. Doubt that he will have MRSA or gram neg- will send culture ( superficial). Currently on vanco and ceftriaxone. DC vanco and change to clindamycin. It is very important that he keep the leg elevated on 4 pillows and will need ACE compressive bandage starting tomorrow. He is not ready to be transitioned to PO yet.  [facility] is on glecaprevir/pibrentasvir.( has brought his own supply) ? ? ___________________________________________________ Discussed with patient, requesting provider Note:  This document was prepared using Dragon voice recognition software and may include unintentional dictation errors.

## 2018-10-04 NOTE — Progress Notes (Signed)
[place] [facility] [place] NAME: Corey James    MR#:  [id number]  DATE OF BIRTH:  07-Jul-1955  SUBJECTIVE:   Patient reports he wants to go home. He wants to get IV antibiotics home health come do the dressing changes and follow-up doctors as outpatient. REVIEW OF SYSTEMS:   Review of Systems  Constitutional: Negative for chills, fever and weight loss.  HENT: Negative for ear discharge, ear pain and nosebleeds.   Eyes: Negative for blurred vision, pain and discharge.  Respiratory: Negative for sputum production, shortness of breath, wheezing and stridor.   Cardiovascular: Positive for leg swelling. Negative for chest pain, palpitations, orthopnea and PND.  Gastrointestinal: Negative for abdominal pain, diarrhea, nausea and vomiting.  Genitourinary: Negative for frequency and urgency.  Musculoskeletal: Negative for back pain and joint pain.  Skin: Positive for rash.  Neurological: Negative for sensory change, speech change, focal weakness and weakness.  Psychiatric/Behavioral: Negative for depression and hallucinations. The patient is not nervous/anxious.    Tolerating Diet:yesTolerating PT: yes  DRUG ALLERGIES:   Allergies  Allergen Reactions  . Penicillins Itching and Rash    Did it involve swelling of the face/tongue/throat, SOB, or low BP? Unknown Did it involve sudden or severe rash/hives, skin peeling, or any reaction on the inside of your mouth or nose? Yes Did you need to seek medical attention at a hospital or doctor's office? Unknown When did it last happen?Unknown If all above answers are "NO", may proceed with cephalosporin use.     VITALS:  Blood pressure 134/76, pulse (!) 109, temperature 97.7 F (36.5 C), temperature source Oral, resp. rate 20, height 5\' 9"  (1.753 m), weight (!) 150.6 kg, SpO2 92 %.  PHYSICAL EXAMINATION:   Physical Exam  GENERAL:  63 y.o.-year-old patient lying in the bed with no acute  distress. obese EYES: Pupils equal, round, reactive to light and accommodation. No scleral icterus. Extraocular muscles intact.  HEENT: Head atraumatic, normocephalic. Oropharynx and nasopharynx clear.  NECK:  Supple, no jugular venous distention. No thyroid enlargement, no tenderness.  LUNGS: Normal breath sounds bilaterally, no wheezing, rales, rhonchi. No use of accessory muscles of respiration.  CARDIOVASCULAR: S1, S2 normal. No murmurs, rubs, or gallops.  ABDOMEN: Soft, nontender, nondistended. Bowel sounds present. No organomegaly or mass.  EXTREMITIES:      NEUROLOGIC: Cranial nerves II through XII are intact. No focal Motor or sensory deficits b/l.   PSYCHIATRIC:  patient is alert and oriented x 3.  SKIN: No obvious rash, lesion, or ulcer.   LABORATORY PANEL:  CBC Recent Labs  Lab 10/03/18 1315  WBC 13.9*  HGB 14.9  HCT 46.9  PLT 254    Chemistries  Recent Labs  Lab 10/03/18 1315  NA 138  K 3.9  CL 101  CO2 27  GLUCOSE 138*  BUN 16  CREATININE 1.13  CALCIUM 9.1  AST 14*  ALT 18  ALKPHOS 88  BILITOT 0.4   Cardiac Enzymes No results for input(s): TROPONINI in the last 168 hours. RADIOLOGY:  US Venous Img Lower Unilateral Left  Result Date: 10/03/2018 CLINICAL DATA:  Left lower extremity pain and edema for the past 1.5 weeks. History of smoking. Evaluate for DVT. EXAM: LEFT LOWER EXTREMITY VENOUS DOPPLER ULTRASOUND TECHNIQUE: Gray-scale sonography with graded compression, as well as color Doppler and duplex ultrasound were performed to evaluate the lower extremity deep venous systems from the level of the common femoral vein and including the common femoral, femoral,  profunda femoral, popliteal and calf veins including the posterior tibial, peroneal and gastrocnemius veins when visible. The superficial great saphenous vein was also interrogated. Spectral Doppler was utilized to evaluate flow at rest and with distal augmentation maneuvers in the common femoral,  femoral and popliteal veins. COMPARISON:  None. FINDINGS: Examination degraded due to patient body habitus and poor sonographic window. Contralateral Common Femoral Vein: Respiratory phasicity is normal and symmetric with the symptomatic side. No evidence of thrombus. Normal compressibility. Common Femoral Vein: No evidence of thrombus. Normal compressibility, respiratory phasicity and response to augmentation. Saphenofemoral Junction: No evidence of thrombus. Normal compressibility and flow on color Doppler imaging. Profunda Femoral Vein: No evidence of thrombus. Normal compressibility and flow on color Doppler imaging. Femoral Vein: No evidence of thrombus. Normal compressibility, respiratory phasicity and response to augmentation. Popliteal Vein: No evidence of thrombus. Normal compressibility, respiratory phasicity and response to augmentation. Calf Veins: Appear patent where imaged. Superficial Great Saphenous Vein: No evidence of thrombus. Normal compressibility. Venous Reflux:  None. Other Findings:  None. IMPRESSION: No evidence of DVT within the left lower extremity. Electronically Signed   By: Sandi Mariscal M.D.   On: 10/03/2018 16:35   ASSESSMENT AND PLAN:   Corey James  is a 63 y.o. male with a known history of left lower extremity chronic venous stasis ulcers who was recently admitted on 14th of April discharge on 19th of April with oral clindamycin and Keflex for left lower extremity cellulitis. There was no evidence of abscess on necrotizing fasciitis. He was at that time followed by surgery and infectious disease.  1. Cellulitis left lower extremity recurrent with chronic venous stasis ulcer setting of chronic tobacco abuse -IV Rocephin and vancomycin -patient recently finished the course of Keflex and clindamycin -infectious disease consultation-- message sent to Dr. Tama High -consider surgical consultation if needed - Vascular consult for possible PAD given history of smoking--spoke  with Dr dew -left thigh area looks ok. No discharge. Suture+  2. Morbid obesity  3. Chronic pain syndrome with history of sciatica -patient has been on narcotics he tells me he gets it through his primary care physician and [name]  4. DVT prophylaxis subcu Lovenox  Case discussed with Care Management/Social Worker. Management plans discussed with the patient, family and they are in agreement.  CODE STATUS: full  DVT Prophylaxis: lovenox  TOTAL TIME TAKING CARE OF THIS PATIENT: *30* minutes.  >50% time spent on counselling and coordination of care  POSSIBLE D/C IN *?* DAYS, DEPENDING ON CLINICAL CONDITION.  Note: This dictation was prepared with Dragon dictation along with smaller phrase technology. Any transcriptional errors that result from this process are unintentional.  Fritzi Mandes M.D on 10/04/2018 at 11:51 AM  Between 7am to 6pm - Pager - [phone]  After 6pm go to [email or web address] - password [email or web address] [facility]  Office  [phone]  CC: Primary care physician; [name], FNPPatient ID: Corey James, male   DOB: 11/15/1955, 63 y.o.   MRN: [id number]

## 2018-10-04 NOTE — Progress Notes (Addendum)
Pt will not need IV abx long term so no PICC line is warranted at this time per ID MD and Dr Posey Pronto

## 2018-10-04 NOTE — Progress Notes (Signed)
VAST consulted to place new access. After reviewing patient's chart, called unit RN, Kathlee Nations and discussed patient's needs. Currently receiving multiple antibiotics, but unsure of time he will continue treatment. Needs access now, but might also need ML or PICC depending on length of tx with abx including Vancomycin and Rocephin. Kathlee Nations, RN sent message to Posey Pronto, MD inquiring for more information.

## 2018-10-04 NOTE — Consult Note (Signed)
[place] Nurse wound consult note This is an addendum to my immediate previous note.  I have personally seen the patient and received the following history directly from him.  In February of this year, he developed and area on the left upper thigh that he states was diagnosed as "cellulitis".  He was admitted to [facility] and at that time Dr. Dahlia Byes did surgery on the area of the left upper thigh and removed the cellulitis.  Today the medial left upper thigh has a wound that measures 2 cm width (which varies based on how much I pull on the wound to open it) x 10.2 cm x 2 cm.  It sits within a deep fold.  At the base of the wound I can see a darkened suture.  There are satellite areas around the wound that the patient states occurred when skin was stripped during dressing changes.  These areas are healed.  The wound bed for this is completely pink and clean, without odor.  There is no dressing on the area.  Erythema extends from the foot all the way to the groin.  The leg is visibly edematous and significantly larger than the right leg.  The patient states that the left thigh wound is not tender to touch, nor is the LLE where the scattered areas of wounds are located.  These are addressed in my previous note.  I will add that the lower extremity has increased temperature to touch.  Dressing procedure/placement/frequency to left upper thigh wound:  Cleanse area with saline. Pat dry. Apply Aquacel Ag+([place] [id number]) into the wound. Cover with ABD pad. Tape in place. Change daily. Monitor the wound area(s) for worsening of condition such as: Signs/symptoms of infection,  Increase in size,  Development of or worsening of odor, Development of pain, or increased pain at the affected locations.  Notify the medical team if any of these develop.  Thank you for the consult.  Discussed plan of care with the patient and bedside nurse.  [place] nurse will not follow at this time.  Please re-consult the [place] team if  needed.  Val Riles, RN, MSN, CWOCN, CNS-BC, pager [phone]

## 2018-10-05 DIAGNOSIS — L97929 Non-pressure chronic ulcer of unspecified part of left lower leg with unspecified severity: Secondary | ICD-10-CM

## 2018-10-05 DIAGNOSIS — Z720 Tobacco use: Secondary | ICD-10-CM

## 2018-10-05 NOTE — Progress Notes (Signed)
[facility] [place] NAME: Corey James    MR#:  [id number]  DATE OF BIRTH:  31-Jul-1955  SUBJECTIVE:   Patient reports he wants to go home. Worried about finances and recurrent hospitalization REVIEW OF SYSTEMS:   Review of Systems  Constitutional: Negative for chills, fever and weight loss.  HENT: Negative for ear discharge, ear pain and nosebleeds.   Eyes: Negative for blurred vision, pain and discharge.  Respiratory: Negative for sputum production, shortness of breath, wheezing and stridor.   Cardiovascular: Positive for leg swelling. Negative for chest pain, palpitations, orthopnea and PND.  Gastrointestinal: Negative for abdominal pain, diarrhea, nausea and vomiting.  Genitourinary: Negative for frequency and urgency.  Musculoskeletal: Negative for back pain and joint pain.  Skin: Positive for rash.  Neurological: Negative for sensory change, speech change, focal weakness and weakness.  Psychiatric/Behavioral: Negative for depression and hallucinations. The patient is not nervous/anxious.    Tolerating Diet:yes Tolerating PT: self ambulatory  DRUG ALLERGIES:   Allergies  Allergen Reactions  . Penicillins Itching and Rash    Did it involve swelling of the face/tongue/throat, SOB, or low BP? Unknown Did it involve sudden or severe rash/hives, skin peeling, or any reaction on the inside of your mouth or nose? Yes Did you need to seek medical attention at a hospital or doctor's office? Unknown When did it last happen?Unknown If all above answers are "NO", may proceed with cephalosporin use.     VITALS:  Blood pressure (!) 145/75, pulse 72, temperature 98.2 F (36.8 C), temperature source Oral, resp. rate 20, height 5\' 9"  (1.753 m), weight (!) 150.6 kg, SpO2 96 %.  PHYSICAL EXAMINATION:   Physical Exam  GENERAL:  63 y.o.-year-old patient lying in the bed with no acute distress. obese EYES: Pupils equal, round,  reactive to light and accommodation. No scleral icterus. Extraocular muscles intact.  HEENT: Head atraumatic, normocephalic. Oropharynx and nasopharynx clear.  NECK:  Supple, no jugular venous distention. No thyroid enlargement, no tenderness.  LUNGS: Normal breath sounds bilaterally, no wheezing, rales, rhonchi. No use of accessory muscles of respiration.  CARDIOVASCULAR: S1, S2 normal. No murmurs, rubs, or gallops.  ABDOMEN: Soft, nontender, nondistended. Bowel sounds present. No organomegaly or mass.  EXTREMITIES:      NEUROLOGIC: Cranial nerves II through XII are intact. No focal Motor or sensory deficits b/l.   PSYCHIATRIC:  patient is alert and oriented x 3.  SKIN: as above  LABORATORY PANEL:  CBC Recent Labs  Lab 10/03/18 1315  WBC 13.9*  HGB 14.9  HCT 46.9  PLT 254    Chemistries  Recent Labs  Lab 10/03/18 1315  NA 138  K 3.9  CL 101  CO2 27  GLUCOSE 138*  BUN 16  CREATININE 1.13  CALCIUM 9.1  AST 14*  ALT 18  ALKPHOS 88  BILITOT 0.4   Cardiac Enzymes No results for input(s): TROPONINI in the last 168 hours. RADIOLOGY:  US Venous Img Lower Unilateral Left  Result Date: 10/03/2018 CLINICAL DATA:  Left lower extremity pain and edema for the past 1.5 weeks. History of smoking. Evaluate for DVT. EXAM: LEFT LOWER EXTREMITY VENOUS DOPPLER ULTRASOUND TECHNIQUE: Gray-scale sonography with graded compression, as well as color Doppler and duplex ultrasound were performed to evaluate the lower extremity deep venous systems from the level of the common femoral vein and including the common femoral, femoral, profunda femoral, popliteal and calf veins including the posterior tibial, peroneal and gastrocnemius veins when  visible. The superficial great saphenous vein was also interrogated. Spectral Doppler was utilized to evaluate flow at rest and with distal augmentation maneuvers in the common femoral, femoral and popliteal veins. COMPARISON:  None. FINDINGS: Examination  degraded due to patient body habitus and poor sonographic window. Contralateral Common Femoral Vein: Respiratory phasicity is normal and symmetric with the symptomatic side. No evidence of thrombus. Normal compressibility. Common Femoral Vein: No evidence of thrombus. Normal compressibility, respiratory phasicity and response to augmentation. Saphenofemoral Junction: No evidence of thrombus. Normal compressibility and flow on color Doppler imaging. Profunda Femoral Vein: No evidence of thrombus. Normal compressibility and flow on color Doppler imaging. Femoral Vein: No evidence of thrombus. Normal compressibility, respiratory phasicity and response to augmentation. Popliteal Vein: No evidence of thrombus. Normal compressibility, respiratory phasicity and response to augmentation. Calf Veins: Appear patent where imaged. Superficial Great Saphenous Vein: No evidence of thrombus. Normal compressibility. Venous Reflux:  None. Other Findings:  None. IMPRESSION: No evidence of DVT within the left lower extremity. Electronically Signed   By: Sandi Mariscal M.D.   On: 10/03/2018 16:35   ASSESSMENT AND PLAN:   Corey James  is a 63 y.o. male with a known history of left lower extremity chronic venous stasis ulcers who was recently admitted on 14th of April discharge on 19th of April with oral clindamycin and Keflex for left lower extremity cellulitis. There was no evidence of abscess on necrotizing fasciitis. He was at that time followed by surgery and infectious disease.  1. Cellulitis left lower extremity recurrent with chronic venous stasis ulcer setting of chronic tobacco abuse -IV Rocephin and clindamycin -patient recently finished the course of Keflex and clindamycin -infectious disease consultation-- Dr. Hermelinda Dellen ACE wrap - Vascular consult for possible PAD given history of smoking--spoke with Dr dew--this is venous stasis ulcers and will need outpt f/u -left thigh area looks ok. No discharge.  Suture+  2. Morbid obesity  3. Chronic pain syndrome with history of sciatica -patient has been on narcotics he tells me he gets it through his primary care physician and [name]  4. DVT prophylaxis subcu Lovenox  Case discussed with Care Management/Social Worker. Management plans discussed with the patient and  in agreement.  CODE STATUS: full  DVT Prophylaxis: lovenox  TOTAL TIME TAKING CARE OF THIS PATIENT: *30* minutes.  >50% time spent on counselling and coordination of care  POSSIBLE D/C IN *?* DAYS, DEPENDING ON CLINICAL CONDITION.  Note: This dictation was prepared with Dragon dictation along with smaller phrase technology. Any transcriptional errors that result from this process are unintentional.  Fritzi Mandes M.D on 10/05/2018 at 8:56 AM  Between 7am to 6pm - Pager - [phone]  After 6pm go to [email or web address] - password [email or web address] [facility]  Office  [phone]  CC: Primary care physician; [name], FNPPatient ID: Corey James, male   DOB: 03-25-1956, 63 y.o.   MRN: [id number]

## 2018-10-05 NOTE — Progress Notes (Signed)
Patient refused dressing change

## 2018-10-05 NOTE — Progress Notes (Signed)
Corey James, Corey James ([id number]) Visit Report for 10/03/2018 Chief Complaint Document Details Patient Name: Corey James, Corey James Date of Service: 10/03/2018 8:30 AM Medical Record Number: [id number] Patient Account Number: [id number] Date of Birth/Sex: 1955-07-07 (63 y.o. M) Treating RN: Montey Hora Primary Care Provider: [name] Other Clinician: Referring Provider: Caroleen Hamman Treating Provider/Extender: Melburn Hake, HOYT Weeks in Treatment: 0 Information Obtained from: Patient Chief Complaint Left LE cellulitis and ulcerations Electronic Signature(s) Signed: 10/05/2018 3:58:08 AM By: Worthy Keeler PA-C Entered By: Worthy Keeler on 10/03/2018 16:50:05 Corey, James ([id number]) -------------------------------------------------------------------------------- HPI Details Patient Name: Corey James Date of Service: 10/03/2018 8:30 AM Medical Record Number: [id number] Patient Account Number: [id number] Date of Birth/Sex: 1955-07-09 (62 y.o. M) Treating RN: Montey Hora Primary Care Provider: [name] Other Clinician: Referring Provider: Caroleen Hamman Treating Provider/Extender: Melburn Hake, HOYT Weeks in Treatment: 0 History of Present Illness HPI Description: 10/03/18 upon evaluation today patient presents for initial evaluation concerning issues that is been having with his left lower extremity. 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 More recently he has seen his surgeon and he was placed in the [place] and then sent [place] for further evaluation and management of his lower extremity at this point. Unfortunately he's had this wrap on since Monday of last week which is now eight days. He had purulent drainage actually seeping through the wrap upon evaluation today. This also had an odor to it which is consistent with Pseudomonas based on what I am seeing at this  point. Unfortunately I think that he still has a quite significant infection at this point I do not believe this has previously cleared. During the course of treatment it also became apparent he had previously seen infectious disease and has apparently been referred back to them. He has been on clindamycin since being discharged from the hospital he is still not unfortunately it does not seem to be making any difference. I did contact the infectious disease office, Dr. Delaine Lame, and it was noted that per their office the patient did not complete previous treatment for the infection which is likely why this never cleared is having issues that he is at this point. Nonetheless their opinion was that he needed to go to the ER for her to see him as a consult in the hospital where he likely needs to be admitted for IV antibiotic therapy based on what's going on at this point. Electronic Signature(s) Signed: 10/05/2018 3:58:08 AM By: Worthy Keeler PA-C Entered By: Worthy Keeler on 10/03/2018 16:51:15 Corey James, Corey James ([id number]) -------------------------------------------------------------------------------- Physical Exam Details Patient Name: Corey James Date of Service: 10/03/2018 8:30 AM Medical Record Number: [id number] Patient Account Number: [id number] Date of Birth/Sex: 1955/10/14 (62 y.o. M) Treating RN: Montey Hora Primary Care Provider: [name] Other Clinician: Referring Provider: Caroleen Hamman Treating Provider/Extender: STONE III, HOYT Weeks in Treatment: 0 Constitutional patient is hypertensive.. pulse regular and within target range for patient.[name] respirations regular, non-labored and within target range for patient.[name] temperature within target range for patient.. Well-nourished and well-hydrated in no acute distress. Eyes conjunctiva clear no eyelid edema noted. pupils equal round and reactive to light and accommodation. Ears, Nose, Mouth, and Throat no gross abnormality  of ear auricles or external auditory canals. normal hearing noted during conversation. mucus membranes moist. Respiratory normal breathing without difficulty. clear to auscultation bilaterally. Cardiovascular regular rate and rhythm with  normal S1, S2. 1+ dorsalis pedis/posterior tibialis pulses. 2+ pitting edema of the bilateral lower extremities. Gastrointestinal (GI) soft, non-tender, non-distended, +BS. no ventral hernia noted. Musculoskeletal normal gait and posture. no significant deformity or arthritic changes, no loss or range of motion, no clubbing. Psychiatric this patient is able to make decisions and demonstrates good insight into disease process. Alert and Oriented x 3. pleasant and cooperative. Notes Upon inspection today patient's left lower extremity actually is quite significantly. Them at this with evidence of cellulitis including being hot to touch noted upon evaluation today. Unfortunately he has an open wound in the left groin area along with the suture remaining at the site although I'm not sure what exactly was sutured. Subsequently in regard to left lower extremity in general the entirety of the Lamb is erythematous and hot to touch. In the distal portion of the limb he has. I drainage noted on his dressing which I did visualize myself today as well and subsequently he also has a significant amount of drainage in general. There is a odor consistent with Pseudomonas based on what I'm seeing at this point. Electronic Signature(s) Signed: 10/05/2018 3:58:08 AM By: Worthy Keeler PA-C Entered By: Worthy Keeler on 10/03/2018 16:52:40 Corey James, Corey James ([id number]) -------------------------------------------------------------------------------- Physician Orders Details Patient Name: Corey James Date of Service: 10/03/2018 8:30 AM Medical Record Number: [id number] Patient Account Number: [id number] Date of Birth/Sex: 1955/12/25 (62 y.o. M) Treating RN: Cornell Barman Primary  Care Provider: [name] Other Clinician: Referring Provider: Caroleen Hamman Treating Provider/Extender: STONE III, HOYT Weeks in Treatment: 0 Verbal / Phone Orders: No Diagnosis Coding Wound Cleansing Wound #1 Left Groin o Cleanse wound with mild soap and water Wound #2 Left,Circumferential Lower Leg o Cleanse wound with mild soap and water Primary Wound Dressing Wound #1 Left Groin o ABD Pad Wound #2 Left,Circumferential Lower Leg o ABD Pad Secondary Dressing Wound #1 Left Groin o Conform/Kerlix Wound #2 Left,Circumferential Lower Leg o Conform/Kerlix Follow-up Appointments Wound #1 Left Groin o Return Appointment in 1 week. Wound #2 Left,Circumferential Lower Leg o Return Appointment in 1 week. Additional Orders / Instructions Wound #1 Left Groin o Other: - Go To Emergency for Evaluation and Treatment of Cellulitis of Left Leg. Wound #2 Left,Circumferential Lower Leg o Other: - Go To Emergency for Evaluation and Treatment of Cellulitis of Left Leg. Electronic Signature(s) Signed: 10/03/2018 11:37:38 AM By: Gretta Cool, BSN, RN, CWS, Kim RN, BSN Signed: 10/05/2018 3:58:08 AM By: Worthy Keeler PA-C Entered By: Gretta Cool BSN, RN, CWS, Kim on 10/03/2018 11:37:37 Corey James, Corey James ([id number]) -------------------------------------------------------------------------------- Problem List Details Patient Name: Corey James Date of Service: 10/03/2018 8:30 AM Medical Record Number: [id number] Patient Account Number: [id number] Date of Birth/Sex: [birth year] (62 y.o. M) Treating RN: Montey Hora Primary Care Provider: [name] Other Clinician: Referring Provider: Caroleen Hamman Treating Provider/Extender: Melburn Hake, HOYT Weeks in Treatment: 0 Active Problems ICD-10 Evaluated Encounter Code Description Active Date Today Diagnosis L03.116 Cellulitis of left lower limb 10/03/2018 No Yes I87.2 Venous insufficiency (chronic) (peripheral) 10/03/2018 No Yes L97.822  Non-pressure chronic ulcer of other part of left lower leg with 10/03/2018 No Yes fat layer exposed Inactive Problems Resolved Problems Electronic Signature(s) Signed: 10/05/2018 3:58:08 AM By: Worthy Keeler PA-C Entered By: Worthy Keeler on 10/03/2018 16:49:37 Corey James, Corey James ([id number]) -------------------------------------------------------------------------------- Progress Note Details Patient Name: Corey James Date of Service: 10/03/2018 8:30 AM Medical Record Number: [id number] Patient Account Number: [id number] Date of Birth/Sex: [birth year] (62 y.o. M) Treating RN: Montey Hora Primary Care Provider:  [name] EMILY Other Clinician: Referring Provider: PABON, DIEGO Treating Provider/Extender: STONE III, HOYT Weeks in Treatment: 0 Subjective Chief Complaint Information obtained from Patient Left LE cellulitis and ulcerations History of Present Illness (HPI) 10/03/18 upon evaluation today patient presents for initial evaluation concerning issues that is been having with his left lower extremity. 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 More recently he has seen his surgeon and he was placed in the [place] and then sent [place] for further evaluation and management of his lower extremity at this point. Unfortunately he's had this wrap on since Monday of last week which is now eight days. He had purulent drainage actually seeping through the wrap upon evaluation today. This also had an odor to it which is consistent with Pseudomonas based on what I am seeing at this point. Unfortunately I think that he still has a quite significant infection at this point I do not believe this has previously cleared. During the course of treatment it also became apparent he had previously seen infectious disease and has apparently been referred back to them. He has been on  clindamycin since being discharged from the hospital he is still not unfortunately it does not seem to be making any difference. I did contact the infectious disease office, Dr. Delaine Lame, and it was noted that per their office the patient did not complete previous treatment for the infection which is likely why this never cleared is having issues that he is at this point. Nonetheless their opinion was that he needed to go to the ER for her to see him as a consult in the hospital where he likely needs to be admitted for IV antibiotic therapy based on what's going on at this point. Patient History Information obtained from Patient. Allergies penicillin Family History Cancer - Father, Stroke - Father, No family history of Diabetes, Heart Disease, Hypertension, Kidney Disease, Lung Disease, Seizures, Thyroid Problems, Tuberculosis. Social History Current every day smoker, Alcohol Use - Rarely, Drug Use - No History, Caffeine Use - Daily. Medical History Cardiovascular Patient has history of Hypertension Gastrointestinal Patient has history of Hepatitis C - Taking Medication Denies history of Cirrhosis , Colitis, Crohn s, Hepatitis A, Hepatitis B Hospitalization/Surgery History - [facility]/Cellulitis. Review of Systems (ROS) Eyes Corey James, Corey James ([id number]) Denies complaints or symptoms of Dry Eyes, Vision Changes, Glasses / Contacts. Ear/Nose/Mouth/Throat Denies complaints or symptoms of Difficult clearing ears, Sinusitis. Hematologic/Lymphatic Denies complaints or symptoms of Bleeding / Clotting Disorders, Human Immunodeficiency Virus. Respiratory Denies complaints or symptoms of Chronic or frequent coughs, Shortness of Breath. Cardiovascular Complains or has symptoms of LE edema. Gastrointestinal Denies complaints or symptoms of Frequent diarrhea, Nausea, Vomiting. Endocrine Denies complaints or symptoms of Hepatitis, Thyroid disease, Polydypsia (Excessive  Thirst). Genitourinary Denies complaints or symptoms of Kidney failure/ Dialysis, Incontinence/dribbling. Immunological Denies complaints or symptoms of Hives, Itching. Integumentary (Skin) Complains or has symptoms of Wounds, Bleeding or bruising tendency. Musculoskeletal Denies complaints or symptoms of Muscle Pain, Muscle Weakness. Neurologic Denies complaints or symptoms of Numbness/parasthesias, Focal/Weakness. Psychiatric Denies complaints or symptoms of Anxiety, Claustrophobia. Objective Constitutional patient is hypertensive.. pulse regular and within target range for patient.[name] respirations regular, non-labored and within target range for patient.[name] temperature within target range for patient.. Well-nourished and well-hydrated in no acute distress. Vitals Time Taken: 9:14 AM, Height: 69 in, Weight: 336 lbs, BMI: 49.6, Temperature: 98 F, Pulse: 80  bpm, Respiratory Rate: 18 breaths/min, Blood Pressure: 167/77 mmHg. Eyes conjunctiva clear no eyelid edema noted. pupils equal round and reactive to light and accommodation. Ears, Nose, Mouth, and Throat no gross abnormality of ear auricles or external auditory canals. normal hearing noted during conversation. mucus membranes moist. Respiratory normal breathing without difficulty. clear to auscultation bilaterally. Cardiovascular regular rate and rhythm with normal S1, S2. 1+ dorsalis pedis/posterior tibialis pulses. 2+ pitting edema of the bilateral lower extremities. Gastrointestinal (GI) Corey James, Corey James ([id number]) soft, non-tender, non-distended, +BS. no ventral hernia noted. Musculoskeletal normal gait and posture. no significant deformity or arthritic changes, no loss or range of motion, no clubbing. Psychiatric this patient is able to make decisions and demonstrates good insight into disease process. Alert and Oriented x 3. pleasant and cooperative. General Notes: Upon inspection today patient's left lower extremity  actually is quite significantly. Them at this with evidence of cellulitis including being hot to touch noted upon evaluation today. Unfortunately he has an open wound in the left groin area along with the suture remaining at the site although I'm not sure what exactly was sutured. Subsequently in regard to left lower extremity in general the entirety of the Lamb is erythematous and hot to touch. In the distal portion of the limb he has. I drainage noted on his dressing which I did visualize myself today as well and subsequently he also has a significant amount of drainage in general. There is a odor consistent with Pseudomonas based on what I'm seeing at this point. Integumentary (Hair, Skin) Wound #1 status is Open. Original cause of wound was Surgical Injury. The wound is located on the Left Groin. The wound measures 3cm length x 9cm width x 0.1cm depth; 21.206cm^2 area and 2.121cm^3 volume. There is Fat Layer (Subcutaneous Tissue) Exposed exposed. There is no tunneling or undermining noted. There is a medium amount of serous drainage noted. The wound margin is flat and intact. There is large (67-100%) red, friable granulation within the wound bed. There is no necrotic tissue within the wound bed. The periwound skin appearance exhibited: Ecchymosis, Rubor. Periwound temperature was noted as No Abnormality. The periwound has tenderness on palpation. General Notes: Sutures in base of wound. Wound #2 status is Open. Original cause of wound was Gradually Appeared. The wound is located on the Left,Circumferential Lower Leg. The wound measures 28cm length x 40cm width x 0.1cm depth; 879.646cm^2 area and 87.965cm^3 volume. There is Fat Layer (Subcutaneous Tissue) Exposed exposed. There is no tunneling or undermining noted. There is a large amount of purulent drainage noted. The wound margin is indistinct and nonvisible. There is medium (34-66%) red, friable, hyper - granulation within the wound bed.  There is a medium (34-66%) amount of necrotic tissue within the wound bed including Eschar and Adherent Slough. The periwound skin appearance exhibited: Excoriation, Induration, Scarring, Rubor. The periwound skin appearance did not exhibit: Callus, Crepitus, Rash, Dry/Scaly, Maceration, Atrophie Blanche, Cyanosis, Ecchymosis, Hemosiderin Staining, Mottled, Pallor, Erythema. Assessment Active Problems ICD-10 Cellulitis of left lower limb Venous insufficiency (chronic) (peripheral) Non-pressure chronic ulcer of other part of left lower leg with fat layer exposed Plan Wound Cleansing: Wound #1 Left Groin: Cleanse wound with mild soap and water Wound #2 Left,Circumferential Lower Leg: Banke, Corey James ([id number]) Cleanse wound with mild soap and water Primary Wound Dressing: Wound #1 Left Groin: ABD Pad Wound #2 Left,Circumferential Lower Leg: ABD Pad Secondary Dressing: Wound #1 Left Groin: Conform/Kerlix Wound #2 Left,Circumferential Lower Leg: Conform/Kerlix Follow-up Appointments: Wound #1 Left Groin: Return  Appointment in 1 week. Wound #2 Left,Circumferential Lower Leg: Return Appointment in 1 week. Additional Orders / Instructions: Wound #1 Left Groin: Other: - Go To Emergency for Evaluation and Treatment of Cellulitis of Left Leg. Wound #2 Left,Circumferential Lower Leg: Other: - Go To Emergency for Evaluation and Treatment of Cellulitis of Left Leg. I discussed with the patient after discussing with Dr. Gwenevere Ghazi office as well that really he needs IV antibiotic therapy and this is going to require him to go to the ER ASAP for evaluation and treatment. She is aware that he is planning to go and is going to see him in consultation in the emergency department. I think this is the best course of treatment for him he needs to get the cellulitis under control to prevent the possibility of issues such as sepsis.[name] He understands that even though he does not want to go to the  ER he states that he will he just needs to go home and get a few things to pack a bag. I think that is okay he plans to be there about 1 PM today. Subsequently will see him back as needed in the future depending on how things go during the course of this hospital stay. Please see above for specific wound care orders. We will see patient for re-evaluation As needed here in the clinic. If anything worsens or changes patient will contact our office for additional recommendations. Electronic Signature(s) Signed: 10/05/2018 3:58:08 AM By: Worthy Keeler PA-C Entered By: Worthy Keeler on 10/03/2018 Corey James, Corey James ([id number]) -------------------------------------------------------------------------------- ROS/PFSH Details Patient Name: Corey James Date of Service: 10/03/2018 8:30 AM Medical Record Number: [id number] Patient Account Number: [id number] Date of Birth/Sex: 11-Dec-1955 (62 y.o. M) Treating RN: Cornell Barman Primary Care Provider: [name] Other Clinician: Referring Provider: Caroleen Hamman Treating Provider/Extender: STONE III, HOYT Weeks in Treatment: 0 Information Obtained From Patient Eyes Complaints and Symptoms: Negative for: Dry Eyes; Vision Changes; Glasses / Contacts Ear/Nose/Mouth/Throat Complaints and Symptoms: Negative for: Difficult clearing ears; Sinusitis Hematologic/Lymphatic Complaints and Symptoms: Negative for: Bleeding / Clotting Disorders; Human Immunodeficiency Virus Respiratory Complaints and Symptoms: Negative for: Chronic or frequent coughs; Shortness of Breath Cardiovascular Complaints and Symptoms: Positive for: LE edema Medical History: Positive for: Hypertension Gastrointestinal Complaints and Symptoms: Negative for: Frequent diarrhea; Nausea; Vomiting Medical History: Positive for: Hepatitis C - Taking Medication Negative for: Cirrhosis ; Colitis; Crohnos; Hepatitis A; Hepatitis B Endocrine Complaints and Symptoms: Negative for:  Hepatitis; Thyroid disease; Polydypsia (Excessive Thirst) Genitourinary Complaints and Symptoms: Negative for: Kidney failure/ Dialysis; Incontinence/dribbling Corey James, Corey James ([id number]) Immunological Complaints and Symptoms: Negative for: Hives; Itching Integumentary (Skin) Complaints and Symptoms: Positive for: Wounds; Bleeding or bruising tendency Musculoskeletal Complaints and Symptoms: Negative for: Muscle Pain; Muscle Weakness Neurologic Complaints and Symptoms: Negative for: Numbness/parasthesias; Focal/Weakness Psychiatric Complaints and Symptoms: Negative for: Anxiety; Claustrophobia Immunizations Pneumococcal Vaccine: Received Pneumococcal Vaccination: No Implantable Devices None Hospitalization / Surgery History Type of Hospitalization/Surgery [facility]/Cellulitis Family and Social History Cancer: Yes - Father; Diabetes: No; Heart Disease: No; Hypertension: No; Kidney Disease: No; Lung Disease: No; Seizures: No; Stroke: Yes - Father; Thyroid Problems: No; Tuberculosis: No; Current every day smoker; Alcohol Use: Rarely; Drug Use: No History; Caffeine Use: Daily Electronic Signature(s) Signed: 10/03/2018 4:43:42 PM By: Gretta Cool, BSN, RN, CWS, Kim RN, BSN Signed: 10/05/2018 3:58:08 AM By: Worthy Keeler PA-C Entered By: Gretta Cool BSN, RN, CWS, Kim on 10/03/2018 09:55:21 Corey James, Corey James ([id number]) -------------------------------------------------------------------------------- SuperBill Details Patient Name: Corey James Date of Service: 10/03/2018 Medical Record Number:  [id number] Patient Account Number: [id number] Date of Birth/Sex: September 06, 1955 (63 y.o. M) Treating RN: Cornell Barman Primary Care Provider: [name] Other Clinician: Referring Provider: Caroleen Hamman Treating Provider/Extender: Melburn Hake, HOYT Weeks in Treatment: 0 Diagnosis Coding ICD-10 Codes Code Description L03.116 Cellulitis of left lower limb I87.2 Venous insufficiency (chronic) (peripheral) L97.822  Non-pressure chronic ulcer of other part of left lower leg with fat layer exposed Facility Procedures CPT4 Code: [id number] Description: 73532 - WOUND CARE VISIT-LEV 5 EST PT Modifier: Quantity: 1 Physician Procedures CPT4 Code Description: [id number] - WC PHYS LEVEL 4 - NEW PT ICD-10 Diagnosis Description L03.116 Cellulitis of left lower limb I87.2 Venous insufficiency (chronic) (peripheral) L97.822 Non-pressure chronic ulcer of other part of left lower leg wit Modifier: h fat layer expos Quantity: 1 ed Electronic Signature(s) Signed: 10/05/2018 3:58:08 AM By: Worthy Keeler PA-C Previous Signature: 10/03/2018 11:39:04 AM Version By: Gretta Cool, BSN, RN, CWS, Kim RN, BSN Entered By: Worthy Keeler on 10/03/2018 16:54:36

## 2018-10-05 NOTE — Progress Notes (Signed)
Date of Admission:  10/03/2018     ID: Corey James is a 63 y.o. male with celluliits left leg   Subjective: Feeling better Leg feels less painful and swollen No fever  Medications:  . docusate sodium  100 mg Oral BID  . enoxaparin (LOVENOX) injection  40 mg Subcutaneous Q12H  . gabapentin  300 mg Oral q morning - 10a  . gabapentin  600 mg Oral QHS  . Glecaprevir-Pibrentasvir  3 tablet Oral Daily  . senna  1 tablet Oral BID  . sodium chloride flush  3 mL Intravenous Once    Objective: Vital signs in last 24 hours: Temp:  [98.2 F (36.8 C)-98.6 F (37 C)] 98.2 F (36.8 C) (05/14 1142) Pulse Rate:  [72-74] 74 (05/14 1142) Resp:  [20] 20 (05/14 1142) BP: (138-145)/(65-75) 139/75 (05/14 1142) SpO2:  [94 %-96 %] 94 % (05/14 1142)  PHYSICAL EXAM:  General: Alert, cooperative, no distress, appears stated age. Morbid obesity Lungs: Clear to auscultation bilaterally. No Wheezing or Rhonchi. No rales. Heart: Regular rate and rhythm, no murmur, rub or gallop. Abdomen: Soft,  Extremities:left leg below knee less swollen Less erythematous and superficial ulceration drying        Lymph: Cervical, supraclavicular normal. Neurologic: Grossly non-focal  Lab Results Recent Labs    10/03/18 1315  WBC 13.9*  HGB 14.9  HCT 46.9  NA 138  K 3.9  CL 101  CO2 27  BUN 16  CREATININE 1.13   Liver Panel Recent Labs    10/03/18 1315  PROT 8.9*  ALBUMIN 3.6  AST 14*  ALT 18  ALKPHOS 88  BILITOT 0.4   Sedimentation Rate No results for input(s): ESRSEDRATE in the last 72 hours. C-Reactive Protein No results for input(s): CRP in the last 72 hours.  Microbiology:  Studies/Results: US Venous Img Lower Unilateral Left  Result Date: 10/03/2018 CLINICAL DATA:  Left lower extremity pain and edema for the past 1.5 weeks. History of smoking. Evaluate for DVT. EXAM: LEFT LOWER EXTREMITY VENOUS DOPPLER ULTRASOUND TECHNIQUE: Gray-scale sonography with graded compression, as  well as color Doppler and duplex ultrasound were performed to evaluate the lower extremity deep venous systems from the level of the common femoral vein and including the common femoral, femoral, profunda femoral, popliteal and calf veins including the posterior tibial, peroneal and gastrocnemius veins when visible. The superficial great saphenous vein was also interrogated. Spectral Doppler was utilized to evaluate flow at rest and with distal augmentation maneuvers in the common femoral, femoral and popliteal veins. COMPARISON:  None. FINDINGS: Examination degraded due to patient body habitus and poor sonographic window. Contralateral Common Femoral Vein: Respiratory phasicity is normal and symmetric with the symptomatic side. No evidence of thrombus. Normal compressibility. Common Femoral Vein: No evidence of thrombus. Normal compressibility, respiratory phasicity and response to augmentation. Saphenofemoral Junction: No evidence of thrombus. Normal compressibility and flow on color Doppler imaging. Profunda Femoral Vein: No evidence of thrombus. Normal compressibility and flow on color Doppler imaging. Femoral Vein: No evidence of thrombus. Normal compressibility, respiratory phasicity and response to augmentation. Popliteal Vein: No evidence of thrombus. Normal compressibility, respiratory phasicity and response to augmentation. Calf Veins: Appear patent where imaged. Superficial Great Saphenous Vein: No evidence of thrombus. Normal compressibility. Venous Reflux:  None. Other Findings:  None. IMPRESSION: No evidence of DVT within the left lower extremity. Electronically Signed   By: Sandi Mariscal M.D.   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  Recurrent and ongoing cellulitis of the left leg due to venous edema , stasis , and now superficial ulceration.  Culture has staph aureus Continue cefazolin and clindmycin X 48 more hours Keep the leg elevated on 4 pillows-  ACE wrap applied  ?

## 2018-10-06 ENCOUNTER — Telehealth: Payer: Self-pay

## 2018-10-06 ENCOUNTER — Ambulatory Visit: Payer: Self-pay

## 2018-10-06 DIAGNOSIS — Z79899 Other long term (current) drug therapy: Secondary | ICD-10-CM

## 2018-10-06 DIAGNOSIS — Z7289 Other problems related to lifestyle: Secondary | ICD-10-CM

## 2018-10-06 DIAGNOSIS — Z1629 Resistance to other single specified antibiotic: Secondary | ICD-10-CM

## 2018-10-06 DIAGNOSIS — B9562 Methicillin resistant Staphylococcus aureus infection as the cause of diseases classified elsewhere: Secondary | ICD-10-CM

## 2018-10-06 DIAGNOSIS — L97829 Non-pressure chronic ulcer of other part of left lower leg with unspecified severity: Secondary | ICD-10-CM

## 2018-10-06 LAB — AEROBIC CULTURE W GRAM STAIN (SUPERFICIAL SPECIMEN): Gram Stain: NONE SEEN

## 2018-10-06 MED ORDER — SULFAMETHOXAZOLE-TRIMETHOPRIM 800-160 MG PO TABS
1.0000 | ORAL_TABLET | Freq: Two times a day (BID) | ORAL | Status: DC
  Administered 2018-10-06: 15:00:00 1 via ORAL
  Filled 2018-10-06 (×2): qty 1

## 2018-10-06 MED ORDER — CEFDINIR 300 MG PO CAPS
300.0000 mg | ORAL_CAPSULE | Freq: Two times a day (BID) | ORAL | Status: DC
  Filled 2018-10-06 (×2): qty 1

## 2018-10-06 MED ORDER — CLINDAMYCIN HCL 150 MG PO CAPS: 450.0000 mg | ORAL_CAPSULE | Freq: Three times a day (TID) | ORAL | 0 refills | Status: DC

## 2018-10-06 MED ORDER — CEFDINIR 300 MG PO CAPS
300.0000 mg | ORAL_CAPSULE | Freq: Two times a day (BID) | ORAL | Status: DC
  Administered 2018-10-06: 15:00:00 300 mg via ORAL

## 2018-10-06 MED ORDER — CLINDAMYCIN PHOSPHATE 900 MG/50ML IV SOLN
900.0000 mg | Freq: Three times a day (TID) | INTRAVENOUS | Status: DC
  Filled 2018-10-06 (×2): qty 50

## 2018-10-06 MED ORDER — SULFAMETHOXAZOLE-TRIMETHOPRIM 800-160 MG PO TABS: 1.0000 | ORAL_TABLET | Freq: Two times a day (BID) | ORAL | 0 refills | Status: DC

## 2018-10-06 MED ORDER — CEFDINIR 300 MG PO CAPS: 300.0000 mg | ORAL_CAPSULE | Freq: Two times a day (BID) | ORAL | 0 refills | Status: DC

## 2018-10-06 NOTE — Chronic Care Management (AMB) (Signed)
  Care Management   Note  10/06/2018 Name: Corey James MRN: [id number] DOB: 07-19-1955  Care Coordination: [name] is a 63 year old malewho sees [name], FNP for primary care. Ms. [name] the CCM team to consult the patient for financial and community resources and has been engaged with CCM LCSW [name]. Ms [name] requested CCM RN CM contact patient secondary to his ongoing leg infection for possible education and care coordination. Patient's last office visit was 09/14/2018 after hospitalization for cellulitis of left lower leg.   CCM RN CM had scheduled a phone call to patient today, however after review of patient's chart, it is confirmed that patient is currently hospitalized for worsening cellulitis to left leg and was admitted to [facility] 10/03/2018 after presentation to [facility].  Plan: CCM RN CM will follow up with patient upon discharge.  Cherisa Brucker E. Rollene Rotunda, RN, BSN Nurse Care Coordinator [facility] Care Management  [phone]

## 2018-10-06 NOTE — TOC Progression Note (Signed)
Transition of Care [facility]) - Progression Note    Patient Details  Name: Corey James MRN: [id number] Date of Birth: 1955-05-27  Transition of Care [facility] CM/SW Contact  Beverly Sessions, RN Phone Number: 10/06/2018, 11:46 AM  Clinical Narrative:     RNCM continuing to follow determination of antibiotics for home and wound care requirements   Expected Discharge Plan: [place] Barriers to Discharge: Continued Medical Work up  Expected Discharge Plan and Services Expected Discharge Plan: [place]   Discharge Planning Services: CM Consult                                           Social Determinants of Health (SDOH) Interventions    Readmission Risk Interventions No flowsheet data found.

## 2018-10-06 NOTE — Discharge Instructions (Signed)
Dressing changes per instructions °

## 2018-10-06 NOTE — TOC Transition Note (Signed)
Transition of Care [facility] - CM/SW Discharge Note   Patient Details  Name: Dani Danis MRN: [id number] Date of Birth: 1956/04/11  Transition of Care [facility]) CM/SW Contact:  Beverly Sessions, RN Phone Number: 10/06/2018, 2:01 PM   Clinical Narrative:     Patient to discharge home today.  Patient agreeable to charity home health services through [place].  Corene Cornea with [facility] notified of referral.  Patient to discharge on Bactrim $11.03, omnicef $24.51.  Patient requested for goodrx coupons to be printed for [employer].  RNCM discussed the option of Medication Management  However patient assures me he will be able to obtain medications at discharge   Final next level of care: [place] Barriers to Discharge: Barriers Resolved   Patient Goals and CMS Choice Patient states their goals for this hospitalization and ongoing recovery are:: I want to go home CMS Medicare.gov Compare Post Acute Care list provided to:: Patient Choice offered to / list presented to : Patient  Discharge Placement                       Discharge Plan and Services   Discharge Planning Services: CM Consult                      HH Arranged: RN, Social Work [facility] Agency: [place] ([place]) Date [place]: 10/06/18 Time [place]: 1401 Representative spoke with at [place]: [place] ([place]) Interventions     Readmission Risk Interventions No flowsheet data found.

## 2018-10-06 NOTE — Progress Notes (Addendum)
   Date of Admission:  10/03/2018     Wants to go home. Some family issues   Subjective: Feeling better  Medications:  . docusate sodium  100 mg Oral BID  . enoxaparin (LOVENOX) injection  40 mg Subcutaneous Q12H  . gabapentin  300 mg Oral q morning - 10a  . gabapentin  600 mg Oral QHS  . Glecaprevir-Pibrentasvir  3 tablet Oral Daily  . senna  1 tablet Oral BID  . sodium chloride flush  3 mL Intravenous Once    Objective: Vital signs in last 24 hours: Temp:  [97.7 F (36.5 C)-98.2 F (36.8 C)] 97.7 F (36.5 C) (05/15 0437) Pulse Rate:  [74-82] 82 (05/15 0437) Resp:  [20] 20 (05/15 0437) BP: (129-152)/(67-86) 129/86 (05/15 0437) SpO2:  [94 %-95 %] 94 % (05/15 0437)  PHYSICAL EXAM:  General: Alert, cooperative, no distress, appears stated age.  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  Recurrent and ongoing cellulitis of the left leg due to venous edema , stasis , and now superficial ulceration. Started with a left thigh , groin infection in feb with intermittent flare ups. Culture is MRSA- on cefazolin and IV clindamycin. 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 Elevating legs, wonder whetehr the MRSA was just colonizing but will send him on bactrim DS 1 BID and cefdinir 300mg  PO Q 12

## 2018-10-06 NOTE — Discharge Summary (Addendum)
Corey James at [place] NAME: Corey James    MR#:  [id number]  DATE OF BIRTH:  [birth year]  DATE OF ADMISSION:  10/03/2018 ADMITTING PHYSICIAN: Fritzi Mandes, MD  DATE OF DISCHARGE: 10/06/2018  PRIMARY CARE PHYSICIAN: [name], FNP    ADMISSION DIAGNOSIS:  Cellulitis of left lower extremity [Q22.979]  DISCHARGE DIAGNOSIS:  Cellulitis/Chronic venous stasis ulcers with edema--Recurrent  SECONDARY DIAGNOSIS:   Past Medical History:  Diagnosis Date  . Back pain   . Cellulitis   . Disc degeneration, lumbosacral   . Hepatitis C     HOSPITAL COURSE:  Corey James 63 y.o.malewith a known history of left lower extremity chronic venous stasis ulcers who was recently admitted on 14th of April discharge on 19th of April with oral clindamycin and Keflex for left lower extremity cellulitis. There was no evidence of abscess on necrotizing fasciitis. He was at that time followed by surgery and infectious disease.  1.Cellulitis left lower extremity recurrent with chronic venous stasis ulcer setting of chronic tobacco abuse -IV Rocephin and clindamycin--change to po bactrim and cefdinir for 10 days -WC MRSA + sensitive to bactrim Pt is adamant to go home -ACe wrap done by ID -patient recently finished the course of Keflex and clindamycin -infectious disease consultation--Dr. RaviSankar -Vascular consult for possible PAD given history of smoking--spoke with Dr dew--this is venous stasis ulcers and will need outpt f/u -left thigh area looks ok. No discharge.   2.Morbid obesity  3.Chronic pain syndrome with history of sciatica -patient has been on narcotics he tells me he gets it through his primary care physician and [name]  4.DVT prophylaxis subcu Lovenox  D/c home on po clinda for 10 days with ACE wrapping. HHRN for dressing changes  CONSULTS OBTAINED:  Treatment Team:  Tsosie Billing, MD  Algernon Huxley, MD  DRUG ALLERGIES:   Allergies  Allergen Reactions  . Penicillins Itching and Rash    Did it involve swelling of the face/tongue/throat, SOB, or low BP? Unknown Did it involve sudden or severe rash/hives, skin peeling, or any reaction on the inside of your mouth or nose? Yes Did you need to seek medical attention at a hospital or doctor's office? Unknown When did it last happen?Unknown If all above answers are "NO", may proceed with cephalosporin use.     DISCHARGE MEDICATIONS:   Allergies as of 10/06/2018      Reactions   Penicillins Itching, Rash   Did it involve swelling of the face/tongue/throat, SOB, or low BP? Unknown Did it involve sudden or severe rash/hives, skin peeling, or any reaction on the inside of your mouth or nose? Yes Did you need to seek medical attention at a hospital or doctor's office? Unknown When did it last happen?Unknown If all above answers are "NO", may proceed with cephalosporin use.      Medication List    STOP taking these medications   clindamycin 300 MG capsule Commonly known as:  CLEOCIN   oxyCODONE-acetaminophen 7.5-325 MG tablet Commonly known as:  Percocet     TAKE these medications   acetaminophen 500 MG tablet Commonly known as:  TYLENOL Take 1 tablet (500 mg total) by mouth every 6 (six) hours as needed for mild pain or fever.   cefdinir 300 MG capsule Commonly known as:  OMNICEF Take 1 capsule (300 mg total) by mouth every 12 (twelve) hours.   cyclobenzaprine 10 MG tablet Commonly known as:  FLEXERIL Take 10 mg  by mouth as directed.   docusate sodium 100 MG capsule Commonly known as:  COLACE Take 100 mg by mouth 2 (two) times daily.   gabapentin 300 MG capsule Commonly known as:  NEURONTIN Take 300-600 mg by mouth See admin instructions. Take 1 capsule (300mg ) by mouth every morning and up to 2 capsules (600mg ) by mouth at bedtime   Glecaprevir-Pibrentasvir 100-40 MG Tabs Commonly known as:   Mavyret Take 3 tablets by mouth daily with breakfast.   HYDROcodone-acetaminophen 7.5-325 MG tablet Commonly known as:  NORCO Take 1 tablet by mouth every 4 (four) hours as needed for severe pain.   sulfamethoxazole-trimethoprim 800-160 MG tablet Commonly known as:  BACTRIM DS Take 1 tablet by mouth every 12 (twelve) hours.       If you experience worsening of your admission symptoms, develop shortness of breath, life threatening emergency, suicidal or homicidal thoughts you must seek medical attention immediately by calling 911 or calling your MD immediately  if symptoms less severe.  You Must read complete instructions/literature along with all the possible adverse reactions/side effects for all the Medicines you take and that have been prescribed to you. Take any new Medicines after you have completely understood and accept all the possible adverse reactions/side effects.   Please note  You were cared for by a hospitalist during your hospital stay. If you have any questions about your discharge medications or the care you received while you were in the hospital after you are discharged, you can call the unit and asked to speak with the hospitalist on call if the hospitalist that took care of you is not available. Once you are discharged, your primary care physician will handle any further medical issues. Please note that NO REFILLS for any discharge medications will be authorized once you are discharged, as it is imperative that you return to your primary care physician (or establish a relationship with a primary care physician if you do not have one) for your aftercare needs so that they can reassess your need for medications and monitor your lab values. Today   SUBJECTIVE   Patient adamant to go home today no matter what. Denies any complaints.  VITAL SIGNS:  Blood pressure 129/86, pulse 82, temperature 97.7 F (36.5 C), temperature source Oral, resp. rate 20, height 5\' 9"  (1.753  m), weight (!) 150.6 kg, SpO2 94 %.  I/O:    Intake/Output Summary (Last 24 hours) at 10/06/2018 1355 Last data filed at 10/06/2018 0959 Gross per 24 hour  Intake 818 ml  Output 1430 ml  Net -612 ml    PHYSICAL EXAMINATION:  GENERAL:  63 y.o.-year-old patient lying in the bed with no acute distress. obese EYES: Pupils equal, round, reactive to light and accommodation. No scleral icterus. Extraocular muscles intact.  HEENT: Head atraumatic, normocephalic. Oropharynx and nasopharynx clear.  NECK:  Supple, no jugular venous distention. No thyroid enlargement, no tenderness.  LUNGS: Normal breath sounds bilaterally, no wheezing, rales,rhonchi or crepitation. No use of accessory muscles of respiration.  CARDIOVASCULAR: S1, S2 normal. No murmurs, rubs, or gallops.  ABDOMEN: Soft, non-tender, non-distended. Bowel sounds present. No organomegaly or mass.  EXTREMITIES:    NEUROLOGIC: Cranial nerves II through XII are intact. Muscle strength 5/5 in all extremities. Sensation intact. Gait not checked.  PSYCHIATRIC: The patient is alert and oriented x 3.  SKIN: No obvious rash, lesion, or ulcer.   DATA REVIEW:   CBC  Recent Labs  Lab 10/03/18 1315  WBC 13.9*  HGB  14.9  HCT 46.9  PLT 254    Chemistries  Recent Labs  Lab 10/03/18 1315  NA 138  K 3.9  CL 101  CO2 27  GLUCOSE 138*  BUN 16  CREATININE 1.13  CALCIUM 9.1  AST 14*  ALT 18  ALKPHOS 88  BILITOT 0.4    Microbiology Results   Recent Results (from the past 240 hour(s))  Blood culture (routine x 2)     Status: None (Preliminary result)   Collection Time: 10/03/18  1:15 PM  Result Value Ref Range Status   Specimen Description BLOOD LEFT ANTECUBITAL  Final   Special Requests   Final    BOTTLES DRAWN AEROBIC AND ANAEROBIC Blood Culture adequate volume   Culture   Final    NO GROWTH 3 DAYS Performed at [facility], [address], [place], [place] [address]    Report Status PENDING  Incomplete   Blood culture (routine x 2)     Status: None (Preliminary result)   Collection Time: 10/03/18  3:06 PM  Result Value Ref Range Status   Specimen Description BLOOD BLOOD RIGHT HAND  Final   Special Requests   Final    BOTTLES DRAWN AEROBIC AND ANAEROBIC Blood Culture results may not be optimal due to an excessive volume of blood received in culture bottles   Culture   Final    NO GROWTH 3 DAYS Performed at [facility], [address] [place], [place] [address]    Report Status PENDING  Incomplete  SARS Coronavirus 2 (CEPHEID - Performed in [place] hospital lab), Hosp Order     Status: None   Collection Time: 10/03/18  4:45 PM  Result Value Ref Range Status   SARS Coronavirus 2 NEGATIVE NEGATIVE Final    Comment: (NOTE) If result is NEGATIVE SARS-CoV-2 target nucleic acids are NOT DETECTED. The SARS-CoV-2 RNA is generally detectable in upper and lower  respiratory specimens during the acute phase of infection. The lowest  concentration of SARS-CoV-2 viral copies this assay can detect is 250  copies / mL. A negative result does not preclude SARS-CoV-2 infection  and should not be used as the sole basis for treatment or other  patient management decisions.  A negative result may occur with  improper specimen collection / handling, submission of specimen other  than nasopharyngeal swab, presence of viral mutation(s) within the  areas targeted by this assay, and inadequate number of viral copies  (<250 copies / mL). A negative result must be combined with clinical  observations, patient history, and epidemiological information. If result is POSITIVE SARS-CoV-2 target nucleic acids are DETECTED. The SARS-CoV-2 RNA is generally detectable in upper and lower  respiratory specimens dur ing the acute phase of infection.  Positive  results are indicative of active infection with SARS-CoV-2.  Clinical  correlation with patient history and other diagnostic information is   necessary to determine patient infection status.  Positive results do  not rule out bacterial infection or co-infection with other viruses. If result is PRESUMPTIVE POSTIVE SARS-CoV-2 nucleic acids MAY BE PRESENT.   A presumptive positive result was obtained on the submitted specimen  and confirmed on repeat testing.  While 2019 novel coronavirus  (SARS-CoV-2) nucleic acids may be present in the submitted sample  additional confirmatory testing may be necessary for epidemiological  and / or clinical management purposes  to differentiate between  SARS-CoV-2 and other Sarbecovirus currently known to infect humans.  If clinically indicated additional testing with  an alternate test  methodology [phone]) is advised. The SARS-CoV-2 RNA is generally  detectable in upper and lower respiratory sp ecimens during the acute  phase of infection. The expected result is Negative. Fact Sheet for Patients:  StrictlyIdeas.no Fact Sheet for Healthcare Providers: BankingDealers.co.za This test is not yet approved or cleared by the [place] FDA and has been authorized for detection and/or diagnosis of SARS-CoV-2 by FDA under an Emergency Use Authorization (EUA).  This EUA will remain in effect (meaning this test can be used) for the duration of the COVID-19 declaration under Section 564(b)(1) of the Act, 21 U.S.C. section 360bbb-3(b)(1), unless the authorization is terminated or revoked sooner. Performed at [facility], [address] [place], [place] [address]   Aerobic Culture (superficial specimen)     Status: None   Collection Time: 10/04/18 12:28 PM  Result Value Ref Range Status   Specimen Description WOUND LEFT LEG  Final   Special Requests NONE  Final   Gram Stain   Final    NO WBC SEEN RARE GRAM POSITIVE COCCI IN PAIRS Performed at [facility], [place] [address] [place], [place] [address]    Culture FEW METHICILLIN  RESISTANT STAPHYLOCOCCUS AUREUS  Final   Report Status 10/06/2018 FINAL  Final   Organism ID, Bacteria METHICILLIN RESISTANT STAPHYLOCOCCUS AUREUS  Final      Susceptibility   Methicillin resistant staphylococcus aureus - MIC*    CIPROFLOXACIN >=8 RESISTANT Resistant     ERYTHROMYCIN >=8 RESISTANT Resistant     GENTAMICIN <=0.5 SENSITIVE Sensitive     OXACILLIN >=4 RESISTANT Resistant     TETRACYCLINE >=16 RESISTANT Resistant     VANCOMYCIN 1 SENSITIVE Sensitive     TRIMETH/SULFA <=10 SENSITIVE Sensitive     CLINDAMYCIN >=8 RESISTANT Resistant     RIFAMPIN <=0.5 SENSITIVE Sensitive     Inducible Clindamycin NEGATIVE Sensitive     * FEW METHICILLIN RESISTANT STAPHYLOCOCCUS AUREUS    RADIOLOGY:  No results found.   CODE STATUS:     Code Status Orders  (From admission, onward)         Start     Ordered   10/03/18 2003  Full code  Continuous     10/03/18 2002        Code Status History    Date Active Date Inactive Code Status Order ID Comments User Context   09/05/2018 0154 09/10/2018 1441 Full Code [id number]  Harrie Foreman, MD Inpatient   07/06/2018 2132 07/15/2018 1737 Full Code [id number]  Saundra Shelling, MD Inpatient      TOTAL TIME TAKING CARE OF THIS PATIENT: *40* minutes.    Fritzi Mandes M.D on 10/06/2018 at 1:55 PM  Between 7am to 6pm - Pager - [phone] After 6pm go to [email or web address] - password [email or web address] [place]  Office  [phone]  CC: Primary care physician; [name], FNP

## 2018-10-08 LAB — CULTURE, BLOOD (ROUTINE X 2)
Culture: NO GROWTH
Culture: NO GROWTH
Special Requests: ADEQUATE

## 2018-10-09 ENCOUNTER — Telehealth: Payer: Self-pay

## 2018-10-09 ENCOUNTER — Ambulatory Visit (INDEPENDENT_AMBULATORY_CARE_PROVIDER_SITE_OTHER): Payer: Medicaid Other | Admitting: Surgery

## 2018-10-09 ENCOUNTER — Encounter: Payer: Self-pay | Admitting: Surgery

## 2018-10-09 ENCOUNTER — Other Ambulatory Visit: Payer: Self-pay

## 2018-10-09 VITALS — BP 156/101 | HR 100 | Temp 97.9°F | Resp 18 | Ht 69.0 in | Wt 325.0 lb

## 2018-10-09 DIAGNOSIS — L02419 Cutaneous abscess of limb, unspecified: Secondary | ICD-10-CM

## 2018-10-09 DIAGNOSIS — L03119 Cellulitis of unspecified part of limb: Secondary | ICD-10-CM

## 2018-10-09 MED ORDER — SILVER SULFADIAZINE 1 % EX CREA: TOPICAL_CREAM | CUTANEOUS | 1 refills | Status: DC

## 2018-10-09 NOTE — Patient Instructions (Addendum)
Please continue to apply the cream to the areas and wrap the leg with the bandage. Please also apply some antibiotic ointment to keep the leg moistened but not wet.

## 2018-10-09 NOTE — Progress Notes (Signed)
Outpatient Surgical Follow Up  10/09/2018  Corey James is an 63 y.o. male.   Chief Complaint  Patient presents with  . Routine Post Op    I&D left    HPI: Corey James is a 63 year old male with a history of recurrent cellulitis of the lower extremity with venous stasis ulcers and lymphedema.  Prior history of necrotizing infection of the thigh that has resolved.  Recently hospitalized for recurrent cellulitis of the left lower extremity. Valuated by vascular surgery no evidence of peripheral vascular disease.  However they feel that there is a combination of venous stasis and lymphedema as well as morbid obesity. No Fevers no chills he has been doing well and is taking the antibiotic therapy They are doing daily dressing changes with xeroform.  Past Medical History:  Diagnosis Date  . Back pain   . Cellulitis   . Disc degeneration, lumbosacral   . Hepatitis C     Past Surgical History:  Procedure Laterality Date  . APPLICATION OF WOUND VAC Left 07/14/2018   Procedure: WOUND VAC CHANGE;  Surgeon: Olean Ree, MD;  Location: [facility];  Service: General;  Laterality: Left;  . INCISION AND DRAINAGE ABSCESS Left 07/11/2018   Procedure: INCISION AND DRAINAGE ABSCESS-LEFT THIGH;  Surgeon: [name], MD;  Location: [facility];  Service: General;  Laterality: Left;  . WOUND DEBRIDEMENT Left 07/14/2018   Procedure: POSSIBLE DEBRIDEMENT WOUND-LEFT THIGH;  Surgeon: Olean Ree, MD;  Location: [facility] OR[facility];  Service: General;  Laterality: Left;    Family History  Problem Relation Age of Onset  . Dementia Mother   . Alzheimer's disease Mother   . CVA Father   . Cancer - Colon Father   . Heart attack Brother   . Hypertension Brother     Social History:  reports that he has been smoking cigarettes. He started smoking about 4 weeks ago. He has a 40.00 pack-year smoking history. He uses smokeless tobacco. He reports current alcohol use. He reports previous drug use. Drug:  Marijuana.  Allergies:  Allergies  Allergen Reactions  . Penicillins Itching and Rash    Did it involve swelling of the face/tongue/throat, SOB, or low BP? Unknown Did it involve sudden or severe rash/hives, skin peeling, or any reaction on the inside of your mouth or nose? Yes Did you need to seek medical attention at a hospital or doctor's office? Unknown When did it last happen?Unknown If all above answers are "NO", may proceed with cephalosporin use.     Medications reviewed.    ROS Full ROS performed and is otherwise negative other than what is stated in HPI   BP (!) 156/101   Pulse 100   Temp 97.9 F (36.6 C) (Temporal)   Resp 18   Ht 5\' 9"  (1.753 m)   Wt (!) 325 lb (147.4 kg)   SpO2 96%   BMI 47.99 kg/m   Physical Exam Vitals signs and nursing note reviewed. Exam conducted with a chaperone present.  Constitutional:      Appearance: Normal appearance. He is obese.  Pulmonary:     Effort: Pulmonary effort is normal.     Breath sounds: No stridor. No rhonchi.  Musculoskeletal:     Comments: LLE w edema and venous stasis changes. The cellulitis has resolved and has improved significantly as compared to when he ws hospitalized. No necrotizing infection. Superficial ulceration w clean base,  silvadene and Telfa placed.   Skin:    General: Skin is warm and dry.  Capillary Refill: Capillary refill takes less than 2 seconds.  Neurological:     General: No focal deficit present.     Mental Status: He is alert and oriented to person, place, and time.  Psychiatric:        Mood and Affect: Mood normal.        Behavior: Behavior normal.        Thought Content: Thought content normal.        Judgment: Judgment normal.         Assessment/Plan:  1. Cellulitis and abscess of leg Lymphedema and LE venous stasis ulcers. Resolved cellulitis. No evidence of necrotizing infection or abscess. HE will do daily silvadene or neosporin to ulcer  Then cover with  Telfa and Kerlix wrap. HE has F/U appt wound center and with ID in the next few days.    Greater than 50% of the 15 minutes  visit was spent in counseling/coordination of care   Caroleen Hamman, MD [facility]

## 2018-10-09 NOTE — Telephone Encounter (Signed)
Error

## 2018-10-10 ENCOUNTER — Telehealth: Payer: Self-pay | Admitting: Family Medicine

## 2018-10-10 ENCOUNTER — Encounter: Payer: Medicaid Other | Admitting: Physician Assistant

## 2018-10-10 ENCOUNTER — Other Ambulatory Visit: Payer: Self-pay

## 2018-10-10 DIAGNOSIS — Y838 Other surgical procedures as the cause of abnormal reaction of the patient, or of later complication, without mention of misadventure at the time of the procedure: Secondary | ICD-10-CM | POA: Diagnosis not present

## 2018-10-10 DIAGNOSIS — I10 Essential (primary) hypertension: Secondary | ICD-10-CM | POA: Diagnosis not present

## 2018-10-10 DIAGNOSIS — B1920 Unspecified viral hepatitis C without hepatic coma: Secondary | ICD-10-CM | POA: Diagnosis not present

## 2018-10-10 DIAGNOSIS — L03116 Cellulitis of left lower limb: Secondary | ICD-10-CM | POA: Diagnosis not present

## 2018-10-10 DIAGNOSIS — L97822 Non-pressure chronic ulcer of other part of left lower leg with fat layer exposed: Secondary | ICD-10-CM | POA: Diagnosis not present

## 2018-10-10 DIAGNOSIS — L98492 Non-pressure chronic ulcer of skin of other sites with fat layer exposed: Secondary | ICD-10-CM | POA: Diagnosis not present

## 2018-10-10 DIAGNOSIS — Z6841 Body Mass Index (BMI) 40.0 and over, adult: Secondary | ICD-10-CM | POA: Diagnosis not present

## 2018-10-10 DIAGNOSIS — Z880 Allergy status to penicillin: Secondary | ICD-10-CM | POA: Diagnosis not present

## 2018-10-10 DIAGNOSIS — I872 Venous insufficiency (chronic) (peripheral): Secondary | ICD-10-CM | POA: Diagnosis not present

## 2018-10-10 DIAGNOSIS — F17200 Nicotine dependence, unspecified, uncomplicated: Secondary | ICD-10-CM | POA: Diagnosis not present

## 2018-10-10 DIAGNOSIS — E669 Obesity, unspecified: Secondary | ICD-10-CM | POA: Diagnosis not present

## 2018-10-10 DIAGNOSIS — T8189XA Other complications of procedures, not elsewhere classified, initial encounter: Secondary | ICD-10-CM | POA: Diagnosis not present

## 2018-10-10 DIAGNOSIS — X58XXXA Exposure to other specified factors, initial encounter: Secondary | ICD-10-CM | POA: Diagnosis not present

## 2018-10-10 NOTE — Telephone Encounter (Signed)
Pt has been advised to take 3 tablets daily (all three tablets at the same time) until he completes his treatment. Pt also advise he will need a post treatment appt with Dr. Allen Norris when he is done. Pt verbalized understanding.

## 2018-10-10 NOTE — Telephone Encounter (Signed)
Pt left vm he received a shipment for [place] and needs to know how much to take

## 2018-10-11 NOTE — Progress Notes (Signed)
Corey James ([id number]) Visit Report for 10/10/2018 Chief Complaint Document Details Patient Name: Corey James Date of Service: 10/10/2018 9:00 AM Medical Record Number: [id number] Patient Account Number: [id number] Date of Birth/Sex: 1955-07-03 (63 y.o. M) Treating RN: Harold Barban Primary Care Provider: [name] Other Clinician: Referring Provider: [name] Treating Provider/Extender: Melburn Hake, HOYT Weeks in Treatment: 1 Information Obtained from: Patient Chief Complaint Left LE cellulitis and ulcerations Electronic Signature(s) Signed: 10/10/2018 11:00:46 PM By: Worthy Keeler PA-C Entered By: Worthy Keeler on 10/10/2018 09:21:05 James, Corey ([id number]) -------------------------------------------------------------------------------- HPI Details Patient Name: Corey James Date of Service: 10/10/2018 9:00 AM Medical Record Number: [id number] Patient Account Number: [id number] Date of Birth/Sex: April 22, 1956 (63 y.o. M) Treating RN: Harold Barban Primary Care Provider: [name] Other Clinician: Referring Provider: [name] Treating Provider/Extender: Melburn Hake, HOYT Weeks in Treatment: 1 History of Present Illness HPI Description: 10/03/18 upon evaluation today patient presents for initial evaluation concerning issues that is been having with his left lower extremity. 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 More recently he has seen his surgeon and he was placed in the [place] and then sent [place] for further evaluation and management of his lower extremity at this point. Unfortunately he's had this wrap on since Monday of last week which is now eight days. He had purulent drainage actually seeping through the wrap upon evaluation today. This also had an odor to it which is consistent with Pseudomonas based on what I am seeing at  this point. Unfortunately I think that he still has a quite significant infection at this point I do not believe this has previously cleared. During the course of treatment it also became apparent he had previously seen infectious disease and has apparently been referred back to them. He has been on clindamycin since being discharged from the hospital he is still not unfortunately it does not seem to be making any difference. I did contact the infectious disease office, Dr. Delaine Lame, and it was noted that per their office the patient did not complete previous treatment for the infection which is likely why this never cleared is having issues that he is at this point. Nonetheless their opinion was that he needed to go to the ER for her to see him as a consult in the hospital where he likely needs to be admitted for IV antibiotic therapy based on what's going on at this point. 10/10/18 on evaluation today patient actually appears to be doing much better in regard to his lower extremity as compared to when I last saw him. Fortunately the infection appears to be under control which is excellent news. Unfortunately he still has the growing wound although I do believe that part of the issue with the growing may be the suture which really is not absorbing as well as it should be and therefore may be keeping this area open and draining. I think we need to control the moisture better which is in the plan addressing also believe that we need to see about as well removing the suture today. Electronic Signature(s) Signed: 10/10/2018 11:00:46 PM By: Worthy Keeler PA-C Entered By: Worthy Keeler on 10/10/2018 10:06:25 Corey James ([id number]) -------------------------------------------------------------------------------- Physical Exam Details Patient Name: Corey James Date of Service: 10/10/2018 9:00 AM Medical Record Number: [id number] Patient Account Number: [id number] Date of Birth/Sex: 14-Jul-1955  (63 y.o. M) Treating  RN: Harold Barban Primary Care Provider: [name] Other Clinician: Referring Provider: [name] Treating Provider/Extender: STONE III, HOYT Weeks in Treatment: 1 Constitutional Obese and well-hydrated in no acute distress. Respiratory normal breathing without difficulty. clear to auscultation bilaterally. Cardiovascular regular rate and rhythm with normal S1, S2. Psychiatric this patient is able to make decisions and demonstrates good insight into disease process. Alert and Oriented x 3. pleasant and cooperative. Notes At this point I did go ahead and see about removing the suture from the groin area and I was able to remove this this appears to be what was being used to secure the wound closed internally and it appears to have been a running suture. Nonetheless I was able to remove this today without complication and once remove the wound bed actually appears to be doing better I think this will be able to heal much more readily. This is good news. Nonetheless we can keep an eye on this over the next several weeks especially with the dressings being added. Subsequently I'm going to also recommend that we continue to view something to try to control the moisture more effectively and the patient is in agreement with that plan. Electronic Signature(s) Signed: 10/10/2018 11:00:46 PM By: Worthy Keeler PA-C Entered By: Worthy Keeler on 10/10/2018 10:07:43 Corey James ([id number]) -------------------------------------------------------------------------------- Physician Orders Details Patient Name: Corey James Date of Service: 10/10/2018 9:00 AM Medical Record Number: [id number] Patient Account Number: [id number] Date of Birth/Sex: 1955-10-17 (63 y.o. M) Treating RN: Cornell Barman Primary Care Provider: [name] Other Clinician: Referring Provider: [name] Treating Provider/Extender: Melburn Hake, HOYT Weeks in Treatment: 1 Verbal / Phone Orders:  No Diagnosis Coding ICD-10 Coding Code Description L03.116 Cellulitis of left lower limb I87.2 Venous insufficiency (chronic) (peripheral) L97.822 Non-pressure chronic ulcer of other part of left lower leg with fat layer exposed Wound Cleansing Wound #1 Left Groin o Cleanse wound with mild soap and water Wound #2 Left,Circumferential Lower Leg o Cleanse wound with mild soap and water Primary Wound Dressing Wound #1 Left Groin o Silver Alginate Wound #2 Left,Circumferential Lower Leg o ABD Pad Secondary Dressing Wound #1 Left Groin o ABD pad - secured with tape Wound #2 Left,Circumferential Lower Leg o Conform/Kerlix Dressing Change Frequency Wound #1 Left Groin o Change dressing every other day. Wound #2 Left,Circumferential Lower Leg o Change dressing every other day. Follow-up Appointments Wound #1 Left Groin o Return Appointment in 1 week. Wound #2 Left,Circumferential Lower Leg o Return Appointment in 1 week. Edema Control Wound #1 Left Groin o Elevate legs to the level of the heart and pump ankles as often as possible Wound #2 Left,Circumferential Lower Leg o Elevate legs to the level of the heart and pump ankles as often as possible Medications-please add to medication list. Wound #1 Left Groin S[place]ersheim, Atiba ([id number]) o P.O. Antibiotics - Continue antibiotics prescribed while in the hospital until complete. Wound #2 Left,Circumferential Lower Leg o P.O. Antibiotics - Continue antibiotics prescribed while in the hospital until complete. Electronic Signature(s) Signed: 10/10/2018 4:47:44 PM By: Gretta Cool, BSN, RN, CWS, Kim RN, BSN Signed: 10/10/2018 11:00:46 PM By: Worthy Keeler PA-C Previous Signature: 10/10/2018 11:30:50 AM Version By: Gretta Cool BSN, RN, CWS, Kim RN, BSN Entered By: Gretta Cool, BSN, RN, CWS, Kim on 10/10/2018 11:32:24 BOOMER, WINDERS  ([id number]) -------------------------------------------------------------------------------- Problem List Details Patient Name: Corey James Date of Service: 10/10/2018 9:00 AM Medical Record Number: [id number] Patient Account Number: [id number] Date of Birth/Sex: 10/30/1955 (63 y.o. M) Treating RN: Harold Barban  Primary Care Provider: [name] Other Clinician: Referring Provider: [name] Treating Provider/Extender: Melburn Hake, HOYT Weeks in Treatment: 1 Active Problems ICD-10 Evaluated Encounter Code Description Active Date Today Diagnosis L03.116 Cellulitis of left lower limb 10/03/2018 No Yes I87.2 Venous insufficiency (chronic) (peripheral) 10/03/2018 No Yes L97.822 Non-pressure chronic ulcer of other part of left lower leg with 10/03/2018 No Yes fat layer exposed Inactive Problems Resolved Problems Electronic Signature(s) Signed: 10/10/2018 11:00:46 PM By: Worthy Keeler PA-C Entered By: Worthy Keeler on 10/10/2018 09:21:00 Carcione, Manpreet ([id number]) -------------------------------------------------------------------------------- Progress Note Details Patient Name: Corey James Date of Service: 10/10/2018 9:00 AM Medical Record Number: [id number] Patient Account Number: [id number] Date of Birth/Sex: 1955/07/02 (63 y.o. M) Treating RN: Harold Barban Primary Care Provider: [name] Other Clinician: Referring Provider: [name] Treating Provider/Extender: Melburn Hake, HOYT Weeks in Treatment: 1 Subjective Chief Complaint Information obtained from Patient Left LE cellulitis and ulcerations History of Present Illness (HPI) 10/03/18 upon evaluation today patient presents for initial evaluation concerning issues that is been having with his left lower extremity. 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 More recently he has seen his surgeon  and he was placed in the [place] and then sent [place] for further evaluation and management of his lower extremity at this point. Unfortunately he's had this wrap on since Monday of last week which is now eight days. He had purulent drainage actually seeping through the wrap upon evaluation today. This also had an odor to it which is consistent with Pseudomonas based on what I am seeing at this point. Unfortunately I think that he still has a quite significant infection at this point I do not believe this has previously cleared. During the course of treatment it also became apparent he had previously seen infectious disease and has apparently been referred back to them. He has been on clindamycin since being discharged from the hospital he is still not unfortunately it does not seem to be making any difference. I did contact the infectious disease office, Dr. Delaine Lame, and it was noted that per their office the patient did not complete previous treatment for the infection which is likely why this never cleared is having issues that he is at this point. Nonetheless their opinion was that he needed to go to the ER for her to see him as a consult in the hospital where he likely needs to be admitted for IV antibiotic therapy based on what's going on at this point. 10/10/18 on evaluation today patient actually appears to be doing much better in regard to his lower extremity as compared to when I last saw him. Fortunately the infection appears to be under control which is excellent news. Unfortunately he still has the growing wound although I do believe that part of the issue with the growing may be the suture which really is not absorbing as well as it should be and therefore may be keeping this area open and draining. I think we need to control the moisture better which is in the plan addressing also believe that we need to see about as well removing the suture today. Patient History Information  obtained from Patient. Family History Cancer - Father, Stroke - Father, No family history of Diabetes, Heart Disease, Hypertension, Kidney Disease, Lung Disease, Seizures, Thyroid Problems, Tuberculosis. Social History Current every day smoker, Alcohol Use - Rarely, Drug  Use - No History, Caffeine Use - Daily. Medical History Cardiovascular Patient has history of Hypertension Gastrointestinal Patient has history of Hepatitis C - Taking Medication Denies history of Cirrhosis , Colitis, Crohn s, Hepatitis A, Hepatitis B Hospitalization/Surgery History - [facility]/Cellulitis. Review of Systems (ROS) Constitutional Symptoms (General Health) Denies complaints or symptoms of Fatigue, Fever, Chills, Marked Weight Change. Respiratory Denies complaints or symptoms of Chronic or frequent coughs, Shortness of Breath. Cardiovascular Denies complaints or symptoms of Chest pain, LE edema. Psychiatric Denies complaints or symptoms of Anxiety, Claustrophobia. Dinh, Jaidon ([id number]) Objective Constitutional Obese and well-hydrated in no acute distress. Vitals Time Taken: 9:04 AM, Height: 69 in, Weight: 336 lbs, BMI: 49.6, Temperature: 100 F, Pulse: 98.1 bpm, Respiratory Rate: 18 breaths/min, Blood Pressure: 147/75 mmHg. Respiratory normal breathing without difficulty. clear to auscultation bilaterally. Cardiovascular regular rate and rhythm with normal S1, S2. Psychiatric this patient is able to make decisions and demonstrates good insight into disease process. Alert and Oriented x 3. pleasant and cooperative. General Notes: At this point I did go ahead and see about removing the suture from the groin area and I was able to remove this this appears to be what was being used to secure the wound closed internally and it appears to have been a running suture. Nonetheless I was able to remove this today without complication and once remove the wound bed actually appears to be doing better I think  this will be able to heal much more readily. This is good news. Nonetheless we can keep an eye on this over the next several weeks especially with the dressings being added. Subsequently I'm going to also recommend that we continue to view something to try to control the moisture more effectively and the patient is in agreement with that plan. Integumentary (Hair, Skin) Wound #1 status is Open. Original cause of wound was Surgical Injury. The wound is located on the Left Groin. The wound measures 3cm length x 8.5cm width x 0.1cm depth; 20.028cm^2 area and 2.003cm^3 volume. There is Fat Layer (Subcutaneous Tissue) Exposed exposed. There is no tunneling or undermining noted. There is a large amount of serous drainage noted. The wound margin is flat and intact. There is medium (34-66%) red, friable granulation within the wound bed. There is no necrotic tissue within the wound bed. General Notes: Patient has what appears to be sutures in the base of the wound. Wound #2 status is Open. Original cause of wound was Gradually Appeared. The wound is located on the Left,Circumferential Lower Leg. The wound measures 16cm length x 8cm width x 0.1cm depth; 100.531cm^2 area and 10.053cm^3 volume. There is Fat Layer (Subcutaneous Tissue) Exposed exposed. There is a large amount of purulent drainage noted. The wound margin is indistinct and nonvisible. There is medium (34-66%) red granulation within the wound bed. There is a medium (34-66%) amount of necrotic tissue within the wound bed including Eschar and Adherent Slough. Assessment Active Problems ICD-10 Cellulitis of left lower limb Venous insufficiency (chronic) (peripheral) Non-pressure chronic ulcer of other part of left lower leg with fat layer exposed Knack, Jacky ([id number]) Plan Wound Cleansing: Wound #1 Left Groin: Cleanse wound with mild soap and water Wound #2 Left,Circumferential Lower Leg: Cleanse wound with mild soap and water Primary  Wound Dressing: Wound #1 Left Groin: Silver Alginate Wound #2 Left,Circumferential Lower Leg: ABD Pad Secondary Dressing: Wound #1 Left Groin: ABD pad - secured with tape Wound #2 Left,Circumferential Lower Leg: Conform/Kerlix Dressing Change Frequency: Wound #1 Left Groin: Change  dressing every other day. Wound #2 Left,Circumferential Lower Leg: Change dressing every other day. Follow-up Appointments: Wound #1 Left Groin: Return Appointment in 1 week. Wound #2 Left,Circumferential Lower Leg: Return Appointment in 1 week. Edema Control: Wound #1 Left Groin: Elevate legs to the level of the heart and pump ankles as often as possible Wound #2 Left,Circumferential Lower Leg: Elevate legs to the level of the heart and pump ankles as often as possible Medications-please add to medication list.: Wound #1 Left Groin: P.O. Antibiotics - Continue antibiotics prescribed while in the hospital until complete. Wound #2 Left,Circumferential Lower Leg: P.O. Antibiotics - Continue antibiotics prescribed while in the hospital until complete. My suggestion at this point is gonna be that we go ahead and continue with the above wound care measures for the next week and the patient is in agreement the plan. We will see were things stand at follow-up. Since he seems to be doing well with regard to the leg as well as the groin with regards infection I'm hopeful that we can get this healed. If anything changes or worsens meantime patient will contact the office and let me know. Please see above for specific wound care orders. We will see patient for re-evaluation in 1 week(s) here in the clinic. If anything worsens or changes patient will contact our office for additional recommendations. Electronic Signature(s) Signed: 10/10/2018 11:00:46 PM By: Worthy Keeler PA-C Entered By: Worthy Keeler on 10/10/2018 14:54:48 Espericueta, Crayton  ([id number]) -------------------------------------------------------------------------------- ROS/PFSH Details Patient Name: Corey James Date of Service: 10/10/2018 9:00 AM Medical Record Number: [id number] Patient Account Number: [id number] Date of Birth/Sex: Jul 01, 1955 (63 y.o. M) Treating RN: Harold Barban Primary Care Provider: [name] Other Clinician: Referring Provider: [name] Treating Provider/Extender: Melburn Hake, HOYT Weeks in Treatment: 1 Information Obtained From Patient Constitutional Symptoms (General Health) Complaints and Symptoms: Negative for: Fatigue; Fever; Chills; Marked Weight Change Respiratory Complaints and Symptoms: Negative for: Chronic or frequent coughs; Shortness of Breath Cardiovascular Complaints and Symptoms: Negative for: Chest pain; LE edema Medical History: Positive for: Hypertension Psychiatric Complaints and Symptoms: Negative for: Anxiety; Claustrophobia Gastrointestinal Medical History: Positive for: Hepatitis C - Taking Medication Negative for: Cirrhosis ; Colitis; Crohnos; Hepatitis A; Hepatitis B Immunizations Pneumococcal Vaccine: Received Pneumococcal Vaccination: No Implantable Devices None Hospitalization / Surgery History Type of Hospitalization/Surgery [facility]/Cellulitis Family and Social History Cancer: Yes - Father; Diabetes: No; Heart Disease: No; Hypertension: No; Kidney Disease: No; Lung Disease: No; Seizures: No; Stroke: Yes - Father; Thyroid Problems: No; Tuberculosis: No; Current every day smoker; Alcohol Use: Rarely; Drug Use: No History; Caffeine Use: Daily Physician Affirmation I have reviewed and agree with the above information. Electronic Signature(s) Signed: 10/10/2018 4:39:00 PM By: Harold Barban Signed: 10/10/2018 11:00:46 PM By: Worthy Keeler PA-C Entered By: Worthy Keeler on 10/10/2018 10:06:40 Kozinski, Alfons  ([id number]) -------------------------------------------------------------------------------- SuperBill Details Patient Name: Corey James Date of Service: 10/10/2018 Medical Record Number: [id number] Patient Account Number: [id number] Date of Birth/Sex: 1955-12-19 (63 y.o. M) Treating RN: Harold Barban Primary Care Provider: [name] Other Clinician: Referring Provider: [name] Treating Provider/Extender: Melburn Hake, HOYT Weeks in Treatment: 1 Diagnosis Coding ICD-10 Codes Code Description L03.116 Cellulitis of left lower limb I87.2 Venous insufficiency (chronic) (peripheral) L97.822 Non-pressure chronic ulcer of other part of left lower leg with fat layer exposed Facility Procedures CPT4 Code: [id number] Description: 99213 - WOUND CARE VISIT-LEV 3 EST PT Modifier: Quantity: 1 Physician Procedures CPT4 Code Description: [id number] 18841 - WC PHYS LEVEL 4 - EST PT  ICD-10 Diagnosis Description L03.116 Cellulitis of left lower limb I87.2 Venous insufficiency (chronic) (peripheral) L97.822 Non-pressure chronic ulcer of other part of left lower leg wit Modifier: h fat layer expos Quantity: 1 ed Electronic Signature(s) Signed: 10/10/2018 11:34:09 AM By: Gretta Cool, BSN, RN, CWS, Kim RN, BSN Signed: 10/10/2018 11:00:46 PM By: Worthy Keeler PA-C Entered By: Gretta Cool, BSN, RN, CWS, Kim on 10/10/2018 11:34:09

## 2018-10-11 NOTE — Progress Notes (Signed)
Corey James ([id number]) Visit Report for 10/10/2018 Arrival Information Details Patient Name: Corey James Date of Service: 10/10/2018 9:00 AM Medical Record Number: [id number] Patient Account Number: [id number] Date of Birth/Sex: March 31, 1956 (63 y.o. M) Treating RN: Cornell Barman Primary Care Bianca Vester: [name] Other Clinician: Referring Carles Florea: [name] Treating Ksenia Kunz/Extender: Melburn Hake, HOYT Weeks in Treatment: 1 Visit Information History Since Last Visit Added or deleted any medications: No Patient Arrived: Ambulatory Any new allergies or adverse reactions: No Arrival Time: 09:03 Had a fall or experienced change in No Accompanied By: self activities of daily living that may affect Transfer Assistance: None risk of falls: Patient Identification Verified: Yes Signs or symptoms of abuse/neglect since last visito No Secondary Verification Process Completed: Yes Hospitalized since last visit: No Patient Has Alerts: Yes Implantable device outside of the clinic excluding No cellular tissue based products placed in the center since last visit: Pain Present Now: No Electronic Signature(s) Signed: 10/10/2018 4:47:44 PM By: Gretta Cool, BSN, RN, CWS, Kim RN, BSN Entered By: Gretta Cool, BSN, RN, CWS, Kim on 10/10/2018 09:03:42 Corey James ([id number]) -------------------------------------------------------------------------------- Clinic Level of Care Assessment Details Patient Name: Corey James Date of Service: 10/10/2018 9:00 AM Medical Record Number: [id number] Patient Account Number: [id number] Date of Birth/Sex: [birth year] (63 y.o. M) Treating RN: Cornell Barman Primary Care Ramon Zanders: [name] Other Clinician: Referring Kerie Badger: [name] Treating Zera Markwardt/Extender: Melburn Hake, HOYT Weeks in Treatment: 1 Clinic Level of Care Assessment Items TOOL 4 Quantity Score []  - Use when only an EandM is performed on FOLLOW-UP visit 0 ASSESSMENTS - Nursing Assessment /  Reassessment []  - Reassessment of Co-morbidities (includes updates in patient status) 0 X- 1 5 Reassessment of Adherence to Treatment Plan ASSESSMENTS - Wound and Skin Assessment / Reassessment []  - Simple Wound Assessment / Reassessment - one wound 0 X- 2 5 Complex Wound Assessment / Reassessment - multiple wounds []  - 0 Dermatologic / Skin Assessment (not related to wound area) ASSESSMENTS - Focused Assessment []  - Circumferential Edema Measurements - multi extremities 0 []  - 0 Nutritional Assessment / Counseling / Intervention []  - 0 Lower Extremity Assessment (monofilament, tuning fork, pulses) []  - 0 Peripheral Arterial Disease Assessment (using hand held doppler) ASSESSMENTS - Ostomy and/or Continence Assessment and Care []  - Incontinence Assessment and Management 0 []  - 0 Ostomy Care Assessment and Management (repouching, etc.) PROCESS - Coordination of Care X - Simple Patient / Family Education for ongoing care 1 15 []  - 0 Complex (extensive) Patient / Family Education for ongoing care []  - 0 Staff obtains Programmer, systems, Records, Test Results / Process Orders []  - 0 Staff telephones HHA, Nursing Homes / Clarify orders / etc []  - 0 Routine Transfer to another Facility (non-emergent condition) []  - 0 Routine Hospital Admission (non-emergent condition) []  - 0 New Admissions / Biomedical engineer / Ordering NPWT, Apligraf, etc. []  - 0 Emergency Hospital Admission (emergent condition) X- 1 10 Simple Discharge Coordination []  - 0 Complex (extensive) Discharge Coordination PROCESS - Special Needs []  - Pediatric / Minor Patient Management 0 []  - 0 Isolation Patient Management Corey James ([id number]) []  - 0 Hearing / Language / Visual special needs []  - 0 Assessment of Community assistance (transportation, D/C planning, etc.) []  - 0 Additional assistance / Altered mentation []  - 0 Support Surface(s) Assessment (bed, cushion, seat, etc.) INTERVENTIONS - Wound  Cleansing / Measurement []  - Simple Wound Cleansing - one wound 0 X- 2 5 Complex Wound Cleansing - multiple wounds X- 1 5 Wound Imaging (photographs - any  number of wounds) []  - 0 Wound Tracing (instead of photographs) []  - 0 Simple Wound Measurement - one wound X- 2 5 Complex Wound Measurement - multiple wounds INTERVENTIONS - Wound Dressings []  - Small Wound Dressing one or multiple wounds 0 X- 1 15 Medium Wound Dressing one or multiple wounds X- 1 20 Large Wound Dressing one or multiple wounds []  - 0 Application of Medications - topical []  - 0 Application of Medications - injection INTERVENTIONS - Miscellaneous []  - External ear exam 0 []  - 0 Specimen Collection (cultures, biopsies, blood, body fluids, etc.) []  - 0 Specimen(s) / Culture(s) sent or taken to Lab for analysis []  - 0 Patient Transfer (multiple staff / Civil Service fast streamer / Similar devices) X- 1 5 Simple Staple / Suture removal (25 or less) []  - 0 Complex Staple / Suture removal (26 or more) []  - 0 Hypo / Hyperglycemic Management (close monitor of Blood Glucose) []  - 0 Ankle / Brachial Index (ABI) - do not check if billed separately X- 1 5 Vital Signs Has the patient been seen at the hospital within the last three years: Yes Total Score: 110 Level Of Care: New/Established - Level 3 Electronic Signature(s) Signed: 10/10/2018 4:47:44 PM By: Gretta Cool, BSN, RN, CWS, Kim RN, BSN Entered By: Gretta Cool, BSN, RN, CWS, Kim on 10/10/2018 11:34:01 Corey James ([id number]) -------------------------------------------------------------------------------- Encounter Discharge Information Details Patient Name: Corey James Date of Service: 10/10/2018 9:00 AM Medical Record Number: [id number] Patient Account Number: [id number] Date of Birth/Sex: [birth year] (63 y.o. M) Treating RN: Cornell Barman Primary Care Ellyana Crigler: [name] Other Clinician: Referring Thania Woodlief: [name] Treating Deshawna Mcneece/Extender: Melburn Hake, HOYT Weeks in  Treatment: 1 Encounter Discharge Information Items Discharge Condition: Stable Ambulatory Status: Ambulatory Discharge Destination: Home Transportation: Private Auto Accompanied By: self Schedule Follow-up Appointment: Yes Clinical Summary of Care: Patient Declined Electronic Signature(s) Signed: 10/10/2018 11:36:20 AM By: Gretta Cool, BSN, RN, CWS, Kim RN, BSN Entered By: Gretta Cool, BSN, RN, CWS, Kim on 10/10/2018 11:36:20 Corey James ([id number]) -------------------------------------------------------------------------------- Lower Extremity Assessment Details Patient Name: Corey James Date of Service: 10/10/2018 9:00 AM Medical Record Number: [id number] Patient Account Number: [id number] Date of Birth/Sex: 01-15-1956 (63 y.o. M) Treating RN: Cornell Barman Primary Care Jadea Shiffer: [name] Other Clinician: Referring Addalynn Kumari: [name] Treating Haislee Corso/Extender: Melburn Hake, HOYT Weeks in Treatment: 1 Edema Assessment Assessed: [Left: No] [Right: No] [Left: Edema] [Right: :] Calf Left: Right: Point of Measurement: 34 cm From Medial Instep 43.5 cm cm Ankle Left: Right: Point of Measurement: 12 cm From Medial Instep 25 cm cm Vascular Assessment Pulses: Dorsalis Pedis Palpable: [Left:Yes] Electronic Signature(s) Signed: 10/10/2018 4:47:44 PM By: Gretta Cool, BSN, RN, CWS, Kim RN, BSN Entered By: Gretta Cool, BSN, RN, CWS, Kim on 10/10/2018 09:16:25 Hanks, Steel ([id number]) -------------------------------------------------------------------------------- Multi Wound Chart Details Patient Name: Corey James Date of Service: 10/10/2018 9:00 AM Medical Record Number: [id number] Patient Account Number: [id number] Date of Birth/Sex: 23-Nov-1955 (63 y.o. M) Treating RN: Cornell Barman Primary Care Bettie Swavely: [name] Other Clinician: Referring Wende Longstreth: [name] Treating Lashawna Poche/Extender: Melburn Hake, HOYT Weeks in Treatment: 1 Vital Signs Height(in): 69 Pulse(bpm): 98.1 Weight(lbs):  336 Blood Pressure(mmHg): 147/75 Body Mass Index(BMI): 50 Temperature(F): 100 Respiratory Rate 18 (breaths/min): Photos: [N/A:N/A] Wound Location: Left Groin Left Lower Leg - N/A Circumferential Wounding Event: Surgical Injury Gradually Appeared N/A Primary Etiology: Cellulitis Cellulitis N/A Comorbid History: Hypertension, Hepatitis C Hypertension, Hepatitis C N/A Date Acquired: 07/07/2018 09/04/2018 N/A Weeks of Treatment: 1 1 N/A Wound Status: Open Open N/A Measurements L x W x D 3x8.5x0.1  16x8x0.1 N/A (cm) Area (cm) : 20.028 100.531 N/A Volume (cm) : 2.003 10.053 N/A % Reduction in Area: 5.60% 88.60% N/A % Reduction in Volume: 5.60% 88.60% N/A Classification: Full Thickness Without Full Thickness Without N/A Exposed Support Structures Exposed Support Structures Exudate Amount: Large Large N/A Exudate Type: Serous Purulent N/A Exudate Color: amber yellow, brown, green N/A Wound Margin: Flat and Intact Indistinct, nonvisible N/A Granulation Amount: Medium (34-66%) Medium (34-66%) N/A Granulation Quality: Red, Friable Red N/A Necrotic Amount: None Present (0%) Medium (34-66%) N/A Necrotic Tissue: N/A Eschar, Adherent Slough N/A Exposed Structures: Fat Layer (Subcutaneous Fat Layer (Subcutaneous N/A Tissue) Exposed: Yes Tissue) Exposed: Yes Fascia: No Fascia: No Tendon: No Tendon: No Muscle: No Muscle: No Nath, Yandell ([id number]) Joint: No Joint: No Bone: No Bone: No Epithelialization: Small (1-33%) Medium (34-66%) N/A Assessment Notes: Patient has what appears to N/A N/A be sutures in the base of the wound. Treatment Notes Electronic Signature(s) Signed: 10/10/2018 4:47:44 PM By: Gretta Cool, BSN, RN, CWS, Kim RN, BSN Entered By: Gretta Cool, BSN, RN, CWS, Kim on 10/10/2018 09:21:45 Corey James ([id number]) -------------------------------------------------------------------------------- Multi-Disciplinary Care Plan Details Patient Name: Corey James Date of  Service: 10/10/2018 9:00 AM Medical Record Number: [id number] Patient Account Number: [id number] Date of Birth/Sex: [birth year] (63 y.o. M) Treating RN: Cornell Barman Primary Care Brayah Urquilla: [name] Other Clinician: Referring Lankford Gutzmer: [name] Treating Raphel Stickles/Extender: Melburn Hake, HOYT Weeks in Treatment: 1 Active Inactive Orientation to the Wound Care Program Nursing Diagnoses: Knowledge deficit related to the wound healing center program Goals: Patient/caregiver will verbalize understanding of the [place] Program Date Initiated: 10/03/2018 Target Resolution Date: 10/03/2018 Goal Status: Active Interventions: Provide education on orientation to the wound center Notes: Soft Tissue Infection Nursing Diagnoses: Impaired tissue integrity Knowledge deficit related to home infection control: handwashing, handling of soiled dressings, supply storage Goals: Patient/caregiver will verbalize understanding of or measures to prevent infection and contamination in the home setting Date Initiated: 10/03/2018 Target Resolution Date: 10/03/2018 Goal Status: Active Patient's soft tissue infection will resolve Date Initiated: 10/03/2018 Target Resolution Date: 10/10/2018 Goal Status: Active Signs and symptoms of infection will be recognized early to allow for prompt treatment Date Initiated: 10/03/2018 Target Resolution Date: 10/03/2018 Goal Status: Active Interventions: Assess signs and symptoms of infection every visit Treatment Activities: Systemic antibiotics : 10/03/2018 Notes: Wound/Skin Impairment Nursing Diagnoses: Impaired tissue integrity Knowledge deficit related to smoking impact on wound healing Knowledge deficit related to ulceration/compromised skin integrity Goals: Patient/caregiver will verbalize understanding of skin care regimen EDRICK, WHITEHORN ([id number]) Date Initiated: 10/03/2018 Target Resolution Date: 10/03/2018 Goal Status: Active Ulcer/skin breakdown  will have a volume reduction of 30% by week 4 Date Initiated: 10/03/2018 Target Resolution Date: 11/03/2018 Goal Status: Active Interventions: Assess ulceration(s) every visit Provide education on ulcer and skin care Treatment Activities: Refer to smoking cessation program : 10/03/2018 Topical wound management initiated : 10/03/2018 Notes: Electronic Signature(s) Signed: 10/10/2018 4:47:44 PM By: Gretta Cool, BSN, RN, CWS, Kim RN, BSN Entered By: Gretta Cool, BSN, RN, CWS, Kim on 10/10/2018 09:23:05 Hitchcock, Daxtyn ([id number]) -------------------------------------------------------------------------------- Pain Assessment Details Patient Name: Corey James Date of Service: 10/10/2018 9:00 AM Medical Record Number: [id number] Patient Account Number: [id number] Date of Birth/Sex: 1955/12/22 (63 y.o. M) Treating RN: Cornell Barman Primary Care Janita Camberos: [name] Other Clinician: Referring Ibrahem Volkman: [name] Treating Belen Zwahlen/Extender: Melburn Hake, HOYT Weeks in Treatment: 1 Active Problems Location of Pain Severity and Description of Pain Patient Has Paino No Site Locations Pain Management and Medication Current Pain Management: Electronic Signature(s) Signed: 10/10/2018 4:47:44  PM By: Gretta Cool, BSN, RN, CWS, Kim RN, BSN Entered By: Gretta Cool, BSN, RN, CWS, Kim on 10/10/2018 09:04:32 Corey James ([id number]) -------------------------------------------------------------------------------- Patient/Caregiver Education Details Patient Name: Corey James Date of Service: 10/10/2018 9:00 AM Medical Record Number: [id number] Patient Account Number: [id number] Date of Birth/Gender: 1956-05-16 (63 y.o. M) Treating RN: Cornell Barman Primary Care Physician: [name] Other Clinician: Referring Physician: [name] Treating Physician/Extender: Sharalyn Ink in Treatment: 1 Education Assessment Education Provided To: Patient Education Topics Provided Infection: Handouts: Infection Prevention and  Management Methods: Demonstration, Explain/Verbal Responses: State content correctly Wound/Skin Impairment: Handouts: Caring for Your Ulcer Methods: Demonstration, Explain/Verbal Responses: State content correctly Electronic Signature(s) Signed: 10/10/2018 4:47:44 PM By: Gretta Cool, BSN, RN, CWS, Kim RN, BSN Entered By: Gretta Cool, BSN, RN, CWS, Kim on 10/10/2018 11:34:34 [place], [place] ([id number]) -------------------------------------------------------------------------------- Wound Assessment Details Patient Name: Corey James Date of Service: 10/10/2018 9:00 AM Medical Record Number: [id number] Patient Account Number: [id number] Date of Birth/Sex: 05/10/1956 (63 y.o. M) Treating RN: Cornell Barman Primary Care Nykole Matos: [name] Other Clinician: Referring Dillon Livermore: [name] Treating Shantaya Bluestone/Extender: Melburn Hake, HOYT Weeks in Treatment: 1 Wound Status Wound Number: 1 Primary Etiology: Cellulitis Wound Location: Left Groin Wound Status: Open Wounding Event: Surgical Injury Comorbid History: Hypertension, Hepatitis C Date Acquired: 07/07/2018 Weeks Of Treatment: 1 Clustered Wound: No Photos Wound Measurements Length: (cm) 3 Width: (cm) 8.5 Depth: (cm) 0.1 Area: (cm) 20.028 Volume: (cm) 2.003 % Reduction in Area: 5.6% % Reduction in Volume: 5.6% Epithelialization: Small (1-33%) Tunneling: No Undermining: No Wound Description Full Thickness Without Exposed Support Classification: Structures Wound Margin: Flat and Intact Exudate Large Amount: Exudate Type: Serous Exudate Color: amber Foul Odor After Cleansing: No Slough/Fibrino No Wound Bed Granulation Amount: Medium (34-66%) Exposed Structure Granulation Quality: Red, Friable Fascia Exposed: No Necrotic Amount: None Present (0%) Fat Layer (Subcutaneous Tissue) Exposed: Yes Tendon Exposed: No Muscle Exposed: No Joint Exposed: No Bone Exposed: No Assessment Notes Patient has what appears to be sutures in the  base of the wound. Treatment Notes Wound #1 (Left Groin) Notes Groin: Silver Cell secured with ABD and tape. Leg: ABD and kerlix secured with ace-wrap. BRISON, FIUMARA ([id number]) Electronic Signature(s) Signed: 10/10/2018 4:47:44 PM By: Gretta Cool, BSN, RN, CWS, Kim RN, BSN Entered By: Gretta Cool, BSN, RN, CWS, Kim on 10/10/2018 09:15:20 Tarbet, Doren Custard ([id number]) -------------------------------------------------------------------------------- Wound Assessment Details Patient Name: Corey James Date of Service: 10/10/2018 9:00 AM Medical Record Number: [id number] Patient Account Number: [id number] Date of Birth/Sex: [birth year] (63 y.o. M) Treating RN: Cornell Barman Primary Care Mialynn Shelvin: [name] Other Clinician: Referring Nelvin Tomb: [name] Treating Undray Allman/Extender: Melburn Hake, HOYT Weeks in Treatment: 1 Wound Status Wound Number: 2 Primary Etiology: Cellulitis Wound Location: Left Lower Leg - Circumferential Wound Status: Open Wounding Event: Gradually Appeared Comorbid History: Hypertension, Hepatitis C Date Acquired: 09/04/2018 Weeks Of Treatment: 1 Clustered Wound: No Photos Wound Measurements Length: (cm) 16 Width: (cm) 8 Depth: (cm) 0.1 Area: (cm) 100.531 Volume: (cm) 10.053 % Reduction in Area: 88.6% % Reduction in Volume: 88.6% Epithelialization: Medium (34-66%) Wound Description Full Thickness Without Exposed Support Classification: Structures Wound Margin: Indistinct, nonvisible Exudate Large Amount: Exudate Type: Purulent Exudate Color: yellow, brown, green Foul Odor After Cleansing: No Slough/Fibrino Yes Wound Bed Granulation Amount: Medium (34-66%) Exposed Structure Granulation Quality: Red Fascia Exposed: No Necrotic Amount: Medium (34-66%) Fat Layer (Subcutaneous Tissue) Exposed: Yes Necrotic Quality: Eschar, Adherent Slough Tendon Exposed: No Muscle Exposed: No Joint Exposed: No Bone Exposed: No Treatment Notes Wound #2 (Left,  Circumferential Lower Leg) Notes  Groin: Silver Cell secured with ABD and tape. Leg: ABD and kerlix secured with ace-wrap. Electronic Signature(s) Signed: 10/10/2018 4:47:44 PM By: Gretta Cool, BSN, RN, CWS, Kim RN, BSN [place], [place] ([id number]) Entered By: Gretta Cool, BSN, RN, CWS, Kim on 10/10/2018 09:14:55 Dooner, Doren Custard ([id number]) -------------------------------------------------------------------------------- Vitals Details Patient Name: Corey James Date of Service: 10/10/2018 9:00 AM Medical Record Number: [id number] Patient Account Number: [id number] Date of Birth/Sex: January 10, 1956 (63 y.o. M) Treating RN: Cornell Barman Primary Care Denzal Meir: [name] Other Clinician: Referring Mikhayla Phillis: [name] Treating Mishael Krysiak/Extender: Melburn Hake, HOYT Weeks in Treatment: 1 Vital Signs Time Taken: 09:04 Temperature (F): 100 Height (in): 69 Pulse (bpm): 98.1 Weight (lbs): 336 Respiratory Rate (breaths/min): 18 Body Mass Index (BMI): 49.6 Blood Pressure (mmHg): 147/75 Reference Range: 80 - 120 mg / dl Electronic Signature(s) Signed: 10/10/2018 4:47:44 PM By: Gretta Cool, BSN, RN, CWS, Kim RN, BSN Entered By: Gretta Cool, BSN, RN, CWS, Kim on 10/10/2018 [place]

## 2018-10-12 ENCOUNTER — Ambulatory Visit: Payer: Medicaid Other | Attending: Infectious Diseases | Admitting: Infectious Diseases

## 2018-10-12 ENCOUNTER — Encounter: Payer: Self-pay | Admitting: Infectious Diseases

## 2018-10-12 ENCOUNTER — Other Ambulatory Visit: Payer: Self-pay

## 2018-10-12 VITALS — BP 154/81 | HR 105 | Temp 98.0°F | Ht 69.0 in | Wt 320.0 lb

## 2018-10-12 DIAGNOSIS — Z8619 Personal history of other infectious and parasitic diseases: Secondary | ICD-10-CM

## 2018-10-12 DIAGNOSIS — L03116 Cellulitis of left lower limb: Secondary | ICD-10-CM

## 2018-10-12 DIAGNOSIS — B9562 Methicillin resistant Staphylococcus aureus infection as the cause of diseases classified elsewhere: Secondary | ICD-10-CM

## 2018-10-12 DIAGNOSIS — Z872 Personal history of diseases of the skin and subcutaneous tissue: Secondary | ICD-10-CM

## 2018-10-12 DIAGNOSIS — I878 Other specified disorders of veins: Secondary | ICD-10-CM

## 2018-10-12 DIAGNOSIS — Z880 Allergy status to penicillin: Secondary | ICD-10-CM

## 2018-10-12 DIAGNOSIS — L304 Erythema intertrigo: Secondary | ICD-10-CM

## 2018-10-12 MED ORDER — SULFAMETHOXAZOLE-TRIMETHOPRIM 800-160 MG PO TABS: 1.0000 | ORAL_TABLET | Freq: Two times a day (BID) | ORAL | 0 refills | Status: DC

## 2018-10-12 MED ORDER — CEPHALEXIN 500 MG PO CAPS: 500.0000 mg | ORAL_CAPSULE | Freq: Two times a day (BID) | ORAL | 1 refills | Status: DC

## 2018-10-12 MED ORDER — FLUCONAZOLE 100 MG PO TABS: 100.0000 mg | ORAL_TABLET | Freq: Every day | ORAL | 0 refills | Status: DC

## 2018-10-12 NOTE — Progress Notes (Signed)
NAME: Corey James  DOB: 09/06/1955  MRN: [id number]  Date/Time: 10/12/2018 11:23 AM Follow up visit after recent hospitalization 5/12-5/15  for  left leg cellulitis- was discharged on bactrim and cefdinir with a paln to continue indefinite suppressive therapy Pt says he has been doing better- followed by Surgery ? Corey James is a 63 y.o.male  with a history of hepatitis c  Pt has been battling left leg infection since feb 2020.  He was initially hospitalized at [facility] between February 13 until July 15, 2018 for about necrotizing soft tissue infection of the left groin and underwent surgery. See pictures below     Cultures from surgical specimen then was staph aureus and Streptococcus C.  He got intravenous ceftriaxone and clindamycin while in the hospital and sent home on wound VAC and  p.o. Omnicef and Flagyl for 10 days.  Not sure what his compliance with antibiotic was at that time.  He was being followed by surgeon as outpatient.   He was readmitted to the hospital on 09/04/2018 with cellulitis of the left thigh as well as the leg.  During that hospitalization he had a temperature of 101.5 WBC was 13.5 with creatinine of 1.07.  Blood culture and wound cultures were sent and he was started on Vanco and ceftriaxone.        The groin wound has almost healed but the leg had cellulitis.  The culture from the small groin wound had a Streptococcus C.  He was always in a rush to be discharged but stayed until 09/10/2018 and was discharged home on oral antibiotic which was cephalexin and clindamycin for 10 days.  Since then he had been followed by Dr.pabon and on 09/25/2018 he saw him in his clinic and it was felt that this left thigh soft tissue infection had resolved he had then developed superficial ulceration to the left lower leg due to venous stasis.[name]  He was placed on a Unna boot and was prescribed clindamycin.  He was also asked to follow-up with the wound clinic and with me as  outpatient.  He went to the wound clinic on 5/12/ and when they remove the Unna boot they found the leg was red and they asked him to get admitted to the hospital. On admission to [facility] on 5/12 the leg looked like this     He was treated with IV vaco and ceftriaxone and culture taken from the superficial ulceration on the left leg was MRSA. He did well with leg elevation, ACE wrap and antibiotics and on discharge the leg was     He was discharged on bactrim and cefdinir. He has been doing well- Saw Dr.Pabon and was asked to apply silver cream to the leg  Past Medical History:  Diagnosis Date   Back pain    Cellulitis    Disc degeneration, lumbosacral    Hepatitis C     Past Surgical History:  Procedure Laterality Date   APPLICATION OF WOUND VAC Left 07/14/2018   Procedure: WOUND VAC CHANGE;  Surgeon: Olean Ree, MD;  Location: [facility] ORS;  Service: General;  Laterality: Left;   INCISION AND DRAINAGE ABSCESS Left 07/11/2018   Procedure: INCISION AND DRAINAGE ABSCESS-LEFT THIGH;  Surgeon: [name], MD;  Location: [facility] ORS;  Service: General;  Laterality: Left;   WOUND DEBRIDEMENT Left 07/14/2018   Procedure: POSSIBLE DEBRIDEMENT Corey James;  Surgeon: Olean Ree, MD;  Location: [facility] ORS;  Service: General;  Laterality: Left;   Corey James  Family History  Problem Relation Age of Onset   Dementia Mother    Alzheimer's disease Mother    CVA Father    Cancer - Colon Father    Heart attack Brother    Hypertension Brother    Allergies  Allergen Reactions   Penicillins Itching and Rash    Did it involve swelling of the face/tongue/throat, SOB, or low BP? Unknown Did it involve sudden or severe rash/hives, skin peeling, or any reaction on the inside of your mouth or nose? Yes Did you need to seek medical attention at a hospital or doctor's office? Unknown When did it last happen?Unknown If all above answers are NO, may proceed with cephalosporin  use.   ?  REVIEW OF SYSTEMS:  Const: negative fever, negative chills, negative weight loss Eyes: negative diplopia or visual changes, negative eye pain ENT: negative coryza, negative sore throat Resp: negative cough, hemoptysis, dyspnea Cards: negative for chest pain, palpitations, lower extremity edema GU: negative for frequency, dysuria and hematuria GI: Negative for abdominal pain, diarrhea, bleeding, constipation Skin: negative for rash and pruritus Heme: negative for easy bruising and gum/nose bleeding MS: negative for myalgias, arthralgias, has back pain and rt leg pain.. no  muscle weakness Neurolo:negative for headaches, dizziness, vertigo, memory problems  Psych: negative for feelings of anxiety, depression  Endocrine: negative for thyroid, diabetes Allergy/Immunology- Penicillin?  Objective:  VITALS:  BP (!) 154/81 (BP Location: Left Arm, Patient Position: Sitting, Cuff Size: Large)    Pulse (!) 105    Temp 98 F (36.7 C) (Oral)    Ht 5\' 9"  (1.753 m)    Wt (!) 320 lb (145.2 kg)    BMI 47.26 kg/m  PHYSICAL EXAM:  General: Alert, cooperative, no distress, appears stated age.  Lungs: Clear to auscultation bilaterally. No Wheezing or Rhonchi. No rales. Heart: Regular rate and rhythm, no murmur, rub or gallop. Left groin wound -superficial moist and has surrounding yeast Extremities: left leg swelling and erythema much better Some dry skin and scabs       Pertinent Labs none   ? Impression/Recommendation ?  Recurrent and ongoing cellulitis of the left leg due to venous edema , stasis , and now superficial ulceration.  On bactrim and cefdinir- will give bactrim for 2 more weeks by itself and following which he will be on Cephalexin 500mg  BID as suppressive/maintenance therapy indefinitely ? ?Intertrigo /of the groin wound left- will give fluconazole 100mg  for 3 days Discussed with patient follow up PRn

## 2018-10-12 NOTE — Patient Instructions (Signed)
You are here for follow up of cellulitis- you are doing well- keep the leg ace wrapped, elevated, continue bactrim( trimethoprim/sulfa) 1 tab Twice a day- Stop Cefdinir. And once you finish bactrim in 2 weeks you will be on Keflex 500mg  cap Twice a day for a long time as suppressive therapy. Follow up with Dr.Pabon

## 2018-10-13 ENCOUNTER — Ambulatory Visit: Payer: Self-pay

## 2018-10-13 ENCOUNTER — Other Ambulatory Visit: Payer: Self-pay

## 2018-10-13 DIAGNOSIS — L02419 Cutaneous abscess of limb, unspecified: Secondary | ICD-10-CM

## 2018-10-13 DIAGNOSIS — M7989 Other specified soft tissue disorders: Secondary | ICD-10-CM

## 2018-10-13 DIAGNOSIS — L02416 Cutaneous abscess of left lower limb: Secondary | ICD-10-CM

## 2018-10-13 NOTE — Chronic Care Management (AMB) (Signed)
  Care Management   Note  10/13/2018 Name: Kinser Fellman MRN: [id number] DOB: [birth year]  Care Coordination: Successful telephone encounter to Mr. Matteus Mcnelly, 63 year old male patient of [name], FNP. Mr. Gorniak is currently enrolled in the Care Management program and working with Social Work for financial barriers to health care.  Upon LCSW assessment, she noted patients chronic health conditions, specifically recent hospitalization for cellulitis of the leg, and felt patient could benefit from RN CM services.  Today CCM RN CM reached out to patient who agreed to also engaging with RN CM.  Follow Up Plan: Initial telephone assessment scheduled for 10/19/2018 at 9:00    Yaw Escoto E. Rollene Rotunda, RN, BSN Nurse Care Coordinator [facility] Care Management  [phone]

## 2018-10-17 ENCOUNTER — Ambulatory Visit: Payer: Self-pay | Admitting: *Deleted

## 2018-10-17 DIAGNOSIS — L02416 Cutaneous abscess of left lower limb: Secondary | ICD-10-CM

## 2018-10-17 DIAGNOSIS — Z598 Other problems related to housing and economic circumstances: Secondary | ICD-10-CM

## 2018-10-17 DIAGNOSIS — Z599 Problem related to housing and economic circumstances, unspecified: Secondary | ICD-10-CM

## 2018-10-17 NOTE — Patient Instructions (Signed)
Thank you allowing the Chronic Care Management Team to be a part of your care! It was a pleasure speaking with you today!  1. Please feel free to call this social worker with any additional questions or concerns.  CCM (Chronic Care Management) Team   Trish Fountain RN, BSN Nurse Care Coordinator  [phone]  Ruben Reason PharmD  Clinical Pharmacist  [phone]   Elliot Gurney, LCSW Clinical Social Worker [phone]  Goals Addressed            This Visit's Progress   . "I do not have insurance to pay my hospital bills" (pt-stated)       Current Barriers:  . Financial constraints . Medication procurement  Clinical Social Work Clinical Goal(s):  [name] Over the next 30 days, client will work with SW to address concerns related to filing for hardship related to medical bills and continued medical treatment   Interventions:  . Patient discussed now being approved  for Medicaid and plans to continue to follow up with current primary care provider. . Reminded patient to contact the business office to provide Medicaid number to address outstanding medical bills . Patient discussed recent discharge from the hopsital with leg healing well . Patient discussed previous depressed mood due to financial issues, emotional support and supportive counseling provided. . Patient discussed continued plan to pursue Gastric Bypass Surgery at [facility] and is in the process of scheduling the initial virtual appointment    Patient Self Care Activities:  . Performs ADL's independently . Performs IADL's independently . Calls provider office for new concerns or questions  Please see past updates related to this goal by clicking on the "Past Updates" button in the selected goal          The patient verbalized understanding of instructions provided today and declined a print copy of patient instruction materials.   The CM team will reach out to the patient again over the next 7 days.  to  assess for any additional community resource needs.

## 2018-10-17 NOTE — Chronic Care Management (AMB) (Signed)
   Care Management    Clinical Social Work Follow Up Note  10/17/2018 Name: Corey James MRN: [id number] DOB: [birth year]  Corey James is a 63 y.o. year old male who is a primary care patient of [name], FNP. The CCM team was consulted for assistance due to financial strain.   Review of patient status, including review of consultants reports, other relevant assessments, and collaboration with appropriate care team members and the patient's provider was performed as part of comprehensive patient evaluation and provision of chronic care management services.     Goals Addressed            This Visit's Progress   . "I do not have insurance to pay my hospital bills" (pt-stated)       Current Barriers:  . Financial constraints . Medication procurement  Clinical Social Work Clinical Goal(s):  [name] Over the next 30 days, client will work with SW to address concerns related to filing for hardship related to medical bills and continued medical treatment   Interventions:  . Patient discussed now being approved  for Medicaid and plans to continue to follow up with current primary care provider. . Reminded patient to contact the business office to provide Medicaid number to address outstanding medical bills . Patient discussed recent discharge from the hopsital with leg healing well . Patient discussed previous depressed mood due to financial issues, emotional support and supportive counseling provided. . Patient discussed continued plan to pursue Gastric Bypass Surgery at [facility] and is in the process of scheduling the initial virtual appointment    Patient Self Care Activities:  . Performs ADL's independently . Performs IADL's independently . Calls provider office for new concerns or questions  Please see past updates related to this goal by clicking on the "Past Updates" button in the selected goal          Follow Up Plan: Client will follow up with this social worker as needed    Elliot Gurney, [place] Worker  [facility] Care Management [phone]

## 2018-10-19 ENCOUNTER — Ambulatory Visit: Payer: Self-pay

## 2018-10-19 ENCOUNTER — Telehealth: Payer: Self-pay

## 2018-10-19 DIAGNOSIS — L02416 Cutaneous abscess of left lower limb: Secondary | ICD-10-CM

## 2018-10-19 NOTE — Chronic Care Management (AMB) (Signed)
   Care Management   Unsuccessful Call Note 10/19/2018 Name: Corey James MRN: [id number] DOB: [birth year]  Corey James is a 63 year old male who sees [name], FNP for primary care. Ms. [name] the CCM team to consult the patient forfinancial and community resources and has been engaged with CCM LCSW [name]. Ms [name] requested CCM RN CM contact patient secondary to his ongoing leg infection for possible education and care coordination.Patient's last office visit was 09/14/2018 after hospitalization for cellulitis of left lower leg. Today patient was scheduled for a 9:00am telephone appointment with RN CM.   Was unable to reach patient via telephone at agreed upon appointment tiem. I have left HIPAA compliant voicemail asking patient to return my call. (unsuccessful outreach #1).   Plan: Will follow-up within 7 business days via telephone.      Tuwanda Vokes E. Rollene Rotunda, RN, BSN Nurse Care Coordinator [facility] Care Management  [phone]

## 2018-10-20 ENCOUNTER — Other Ambulatory Visit: Payer: Self-pay

## 2018-10-20 ENCOUNTER — Encounter: Payer: Medicaid Other | Admitting: Physician Assistant

## 2018-10-20 DIAGNOSIS — T8189XA Other complications of procedures, not elsewhere classified, initial encounter: Secondary | ICD-10-CM | POA: Diagnosis not present

## 2018-10-20 DIAGNOSIS — L97821 Non-pressure chronic ulcer of other part of left lower leg limited to breakdown of skin: Secondary | ICD-10-CM | POA: Diagnosis not present

## 2018-10-20 DIAGNOSIS — L03116 Cellulitis of left lower limb: Secondary | ICD-10-CM | POA: Diagnosis not present

## 2018-10-21 NOTE — Progress Notes (Signed)
DELPHIN, FUNES ([id number]) Visit Report for 10/20/2018 Chief Complaint Document Details Patient Name: Corey James Date of Service: 10/20/2018 8:00 AM Medical Record Number: [id number] Patient Account Number: [id number] Date of Birth/Sex: November 24, 1955 (63 y.o. M) Treating RN: Montey Hora Primary Care Provider: [name] Other Clinician: Referring Provider: [name] Treating Provider/Extender: Melburn Hake, Katlin Ciszewski Weeks in Treatment: 2 Information Obtained from: Patient Chief Complaint Left LE cellulitis and ulcerations Electronic Signature(s) Signed: 10/21/2018 12:12:44 AM By: Worthy Keeler PA-C Entered By: Worthy Keeler on 10/20/2018 08:34:59 Corey James ([id number]) -------------------------------------------------------------------------------- HPI Details Patient Name: Corey James Date of Service: 10/20/2018 8:00 AM Medical Record Number: [id number] Patient Account Number: [id number] Date of Birth/Sex: 1955-06-07 (63 y.o. M) Treating RN: Montey Hora Primary Care Provider: [name] Other Clinician: Referring Provider: [name] Treating Provider/Extender: Melburn Hake, Cornelius Marullo Weeks in Treatment: 2 History of Present Illness HPI Description: 10/03/18 upon evaluation today patient presents for initial evaluation concerning issues that is been having with his left lower extremity. 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 More recently he has seen his surgeon and he was placed in the [place] and then sent [place] for further evaluation and management of his lower extremity at this point. Unfortunately he's had this wrap on since Monday of last week which is now eight days. He had purulent drainage actually seeping through the wrap upon evaluation today. This also had an odor to it which is consistent with Pseudomonas based on what I am seeing at  this point. Unfortunately I think that he still has a quite significant infection at this point I do not believe this has previously cleared. During the course of treatment it also became apparent he had previously seen infectious disease and has apparently been referred back to them. He has been on clindamycin since being discharged from the hospital he is still not unfortunately it does not seem to be making any difference. I did contact the infectious disease office, Dr. Delaine Lame, and it was noted that per their office the patient did not complete previous treatment for the infection which is likely why this never cleared is having issues that he is at this point. Nonetheless their opinion was that he needed to go to the ER for her to see him as a consult in the hospital where he likely needs to be admitted for IV antibiotic therapy based on what's going on at this point. 10/10/18 on evaluation today patient actually appears to be doing much better in regard to his lower extremity as compared to when I last saw him. Fortunately the infection appears to be under control which is excellent news. Unfortunately he still has the growing wound although I do believe that part of the issue with the growing may be the suture which really is not absorbing as well as it should be and therefore may be keeping this area open and draining. I think we need to control the moisture better which is in the plan addressing also believe that we need to see about as well removing the suture today. 10/20/18 on evaluation today patient actually appears to be doing excellent in regard to his left lower extremity at this point. He's been tolerating the dressing changes without complication. The good news is he actually seems to be showing signs of improvement and his leg in fact I think is completely healed and  dried up there does not appear to be any signs of infection. Fortunately he overall is progressing well although  the left groin area though a little better still seems to be showing some signs of struggling to heal I think this is mainly due to the crease at this location and the moisture associated. Electronic Signature(s) Signed: 10/21/2018 12:12:44 AM By: Worthy Keeler PA-C Entered By: Worthy Keeler on 10/20/2018 08:48:32 Corey James ([id number]) -------------------------------------------------------------------------------- Physical Exam Details Patient Name: Corey James Date of Service: 10/20/2018 8:00 AM Medical Record Number: [id number] Patient Account Number: [id number] Date of Birth/Sex: Dec 13, 1955 (63 y.o. M) Treating RN: Montey Hora Primary Care Provider: [name] Other Clinician: Referring Provider: [name] Treating Provider/Extender: STONE III, Tahisha Hakim Weeks in Treatment: 2 Constitutional Well-nourished and well-hydrated in no acute distress. Respiratory normal breathing without difficulty. clear to auscultation bilaterally. Cardiovascular regular rate and rhythm with normal S1, S2. Psychiatric this patient is able to make decisions and demonstrates good insight into disease process. Alert and Oriented x 3. pleasant and cooperative. Notes On inspection patient's wound bed actually showed signs of excellent improvement in regard to his left lower extremity there's no signs of active infection and no active draining at this point. In regard to the left groin he does still have evidence of drainage and issues with moisture control which I think is definitely slowing down the healing process in this regard. We may need to attempt a nystatin powder to see if this could be of benefit as well. Electronic Signature(s) Signed: 10/21/2018 12:12:44 AM By: Worthy Keeler PA-C Entered By: Worthy Keeler on 10/20/2018 08:49:32 Corey James ([id number]) -------------------------------------------------------------------------------- Physician Orders Details Patient Name: Corey James Date of Service: 10/20/2018 8:00 AM Medical Record Number: [id number] Patient Account Number: [id number] Date of Birth/Sex: [birth year] (63 y.o. M) Treating RN: Montey Hora Primary Care Provider: [name] Other Clinician: Referring Provider: [name] Treating Provider/Extender: Melburn Hake, Elvin Mccartin Weeks in Treatment: 2 Verbal / Phone Orders: No Diagnosis Coding ICD-10 Coding Code Description L03.116 Cellulitis of left lower limb I87.2 Venous insufficiency (chronic) (peripheral) L97.822 Non-pressure chronic ulcer of other part of left lower leg with fat layer exposed Wound Cleansing Wound #1 Left Groin o Cleanse wound with mild soap and water Wound #2 Left,Circumferential Lower Leg o Cleanse wound with mild soap and water Primary Wound Dressing Wound #1 Left Groin o Silver Alginate o Other: - Nystatin powder - PLEASE KEEP THIS AREA DRY AT ALL TIMES Wound #2 Left,Circumferential Lower Leg o Other: - ACE wrap Secondary Dressing Wound #1 Left Groin o Dry Gauze Dressing Change Frequency Wound #1 Left Groin o Change dressing every day. - and as needed to keep this area dry Wound #2 Left,Circumferential Lower Leg o Change dressing every other day. Follow-up Appointments Wound #1 Left Groin o Return Appointment in 1 week. Wound #2 Left,Circumferential Lower Leg o Return Appointment in 1 week. Edema Control Wound #1 Left Groin o Elevate legs to the level of the heart and pump ankles as often as possible Antunes, Barak ([id number]) Wound #2 Left,Circumferential Lower Leg o Elevate legs to the level of the heart and pump ankles as often as possible Home Health Wound #1 Left Groin o Continue Home Health Visits o Home Health Nurse may visit PRN to address patientos wound care needs. o FACE TO FACE ENCOUNTER: MEDICARE and MEDICAID PATIENTS: I certify that this patient is under my care and that I had a face-to-face encounter that meets the  physician face-to-face encounter  requirements with this patient on this date. The encounter with the patient was in whole or in part for the following MEDICAL CONDITION: (primary reason for [place]) MEDICAL NECESSITY: I certify, that based on my findings, NURSING services are a medically necessary home health service. HOME BOUND STATUS: I certify that my clinical findings support that this patient is homebound (i.e., Due to illness or injury, pt requires aid of supportive devices such as crutches, cane, wheelchairs, walkers, the use of special transportation or the assistance of another person to leave their place of residence. There is a normal inability to leave the home and doing so requires considerable and taxing effort. Other absences are for medical reasons / religious services and are infrequent or of short duration when for other reasons). o If current dressing causes regression in wound condition, may D/C ordered dressing product/s and apply Normal Saline Moist Dressing daily until next [place] / Other MD appointment. [place] of regression in wound condition at [phone]. o Please direct any NON-WOUND related issues/requests for orders to patient's Primary Care Physician Wound #2 Left,Circumferential Lower Leg o [place] Visits o Home Health Nurse may visit PRN to address patientos wound care needs. o FACE TO FACE ENCOUNTER: MEDICARE and MEDICAID PATIENTS: I certify that this patient is under my care and that I had a face-to-face encounter that meets the physician face-to-face encounter requirements with this patient on this date. The encounter with the patient was in whole or in part for the following MEDICAL CONDITION: (primary reason for [place]) MEDICAL NECESSITY: I certify, that based on my findings, NURSING services are a medically necessary home health service. HOME BOUND STATUS: I certify that my clinical  findings support that this patient is homebound (i.e., Due to illness or injury, pt requires aid of supportive devices such as crutches, cane, wheelchairs, walkers, the use of special transportation or the assistance of another person to leave their place of residence. There is a normal inability to leave the home and doing so requires considerable and taxing effort. Other absences are for medical reasons / religious services and are infrequent or of short duration when for other reasons). o If current dressing causes regression in wound condition, may D/C ordered dressing product/s and apply Normal Saline Moist Dressing daily until next [place] / Other MD appointment. [place] of regression in wound condition at [phone]. o Please direct any NON-WOUND related issues/requests for orders to patient's Primary Care Physician Medications-please add to medication list. Wound #1 Left Groin o P.O. Antibiotics - Continue antibiotics prescribed while in the hospital until complete. Wound #2 Left,Circumferential Lower Leg o P.O. Antibiotics - Continue antibiotics prescribed while in the hospital until complete. Patient Medications Allergies: penicillin Notifications Medication Indication Start End nystatin 10/20/2018 DOSE topical 100,000 unit/gram powder - powder topical applied to the left groin wound with each dressing change until healed GRONINGER, Eriq ([id number]) Electronic Signature(s) Signed: 10/20/2018 8:50:36 AM By: Worthy Keeler PA-C Entered By: Worthy Keeler on 10/20/2018 08:50:36 Hynek, Hewitt ([id number]) -------------------------------------------------------------------------------- Problem List Details Patient Name: Corey James Date of Service: 10/20/2018 8:00 AM Medical Record Number: [id number] Patient Account Number: [id number] Date of Birth/Sex: Sep 16, 1955 (63 y.o. M) Treating RN: Montey Hora Primary Care Provider: [name]  Other Clinician: Referring Provider: [name] Treating Provider/Extender: Melburn Hake, Denaly Gatling Weeks in Treatment: 2 Active Problems ICD-10 Evaluated Encounter Code Description Active Date Today Diagnosis L03.116 Cellulitis of left lower limb 10/03/2018 No  Yes I87.2 Venous insufficiency (chronic) (peripheral) 10/03/2018 No Yes L97.822 Non-pressure chronic ulcer of other part of left lower leg with 10/03/2018 No Yes fat layer exposed Inactive Problems Resolved Problems Electronic Signature(s) Signed: 10/21/2018 12:12:44 AM By: Worthy Keeler PA-C Entered By: Worthy Keeler on 10/20/2018 08:34:45 Rampersad, Domnick ([id number]) -------------------------------------------------------------------------------- Progress Note Details Patient Name: Corey James Date of Service: 10/20/2018 8:00 AM Medical Record Number: [id number] Patient Account Number: [id number] Date of Birth/Sex: [birth year] (63 y.o. M) Treating RN: Montey Hora Primary Care Provider: [name] Other Clinician: Referring Provider: [name] Treating Provider/Extender: Melburn Hake, Siri Buege Weeks in Treatment: 2 Subjective Chief Complaint Information obtained from Patient Left LE cellulitis and ulcerations History of Present Illness (HPI) 10/03/18 upon evaluation today patient presents for initial evaluation concerning issues that is been having with his left lower extremity. 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 More recently he has seen his surgeon and he was placed in the [place] and then sent [place] for further evaluation and management of his lower extremity at this point. Unfortunately he's had this wrap on since Monday of last week which is now eight days. He had purulent drainage actually seeping through the wrap upon evaluation today. This also had an odor to it which is consistent with  Pseudomonas based on what I am seeing at this point. Unfortunately I think that he still has a quite significant infection at this point I do not believe this has previously cleared. During the course of treatment it also became apparent he had previously seen infectious disease and has apparently been referred back to them. He has been on clindamycin since being discharged from the hospital he is still not unfortunately it does not seem to be making any difference. I did contact the infectious disease office, Dr. Delaine Lame, and it was noted that per their office the patient did not complete previous treatment for the infection which is likely why this never cleared is having issues that he is at this point. Nonetheless their opinion was that he needed to go to the ER for her to see him as a consult in the hospital where he likely needs to be admitted for IV antibiotic therapy based on what's going on at this point. 10/10/18 on evaluation today patient actually appears to be doing much better in regard to his lower extremity as compared to when I last saw him. Fortunately the infection appears to be under control which is excellent news. Unfortunately he still has the growing wound although I do believe that part of the issue with the growing may be the suture which really is not absorbing as well as it should be and therefore may be keeping this area open and draining. I think we need to control the moisture better which is in the plan addressing also believe that we need to see about as well removing the suture today. 10/20/18 on evaluation today patient actually appears to be doing excellent in regard to his left lower extremity at this point. He's been tolerating the dressing changes without complication. The good news is he actually seems to be showing signs of improvement and his leg in fact I think is completely healed and dried up there does not appear to be any signs of  infection. Fortunately he overall is progressing well although the left groin area though a little better  still seems to be showing some signs of struggling to heal I think this is mainly due to the crease at this location and the moisture associated. Patient History Information obtained from Patient. Family History Cancer - Father, Stroke - Father, No family history of Diabetes, Heart Disease, Hypertension, Kidney Disease, Lung Disease, Seizures, Thyroid Problems, Tuberculosis. Social History Current every day smoker, Alcohol Use - Rarely, Drug Use - No History, Caffeine Use - Daily. Medical History Cardiovascular Patient has history of Hypertension Maita, Boone ([id number]) Gastrointestinal Patient has history of Hepatitis C - Taking Medication Denies history of Cirrhosis , Colitis, Crohn s, Hepatitis A, Hepatitis B Hospitalization/Surgery History - [facility]/Cellulitis. Review of Systems (ROS) Constitutional Symptoms (General Health) Denies complaints or symptoms of Fatigue, Fever, Chills, Marked Weight Change. Respiratory Denies complaints or symptoms of Chronic or frequent coughs, Shortness of Breath. Cardiovascular Complains or has symptoms of LE edema. Denies complaints or symptoms of Chest pain. Psychiatric Denies complaints or symptoms of Anxiety, Claustrophobia. Objective Constitutional Well-nourished and well-hydrated in no acute distress. Vitals Time Taken: 8:11 AM, Height: 69 in, Weight: 336 lbs, BMI: 49.6, Temperature: 97.8 F, Pulse: 94 bpm, Respiratory Rate: 22 breaths/min, Blood Pressure: 160/70 mmHg. Respiratory normal breathing without difficulty. clear to auscultation bilaterally. Cardiovascular regular rate and rhythm with normal S1, S2. Psychiatric this patient is able to make decisions and demonstrates good insight into disease process. Alert and Oriented x 3. pleasant and cooperative. General Notes: On inspection patient's wound bed actually showed signs  of excellent improvement in regard to his left lower extremity there's no signs of active infection and no active draining at this point. In regard to the left groin he does still have evidence of drainage and issues with moisture control which I think is definitely slowing down the healing process in this regard. We may need to attempt a nystatin powder to see if this could be of benefit as well. Integumentary (Hair, Skin) Wound #1 status is Open. Original cause of wound was Surgical Injury. The wound is located on the Left Groin. The wound measures 1.6cm length x 7.4cm width x 0.1cm depth; 9.299cm^2 area and 0.93cm^3 volume. The wound is limited to skin breakdown. There is no tunneling or undermining noted. There is a large amount of serous drainage noted. The wound margin is flat and intact. There is large (67-100%) pink granulation within the wound bed. There is no necrotic tissue within the wound bed. Wound #2 status is Open. Original cause of wound was Gradually Appeared. The wound is located on the Left,Circumferential Lower Leg. The wound measures 0.1cm length x 0.1cm width x 0.1cm depth; 0.008cm^2 area and Swiss, Rishikesh ([id number]) 0.001cm^3 volume. The wound is limited to skin breakdown. There is no tunneling or undermining noted. There is a none present amount of drainage noted. The wound margin is indistinct and nonvisible. There is no granulation within the wound bed. There is no necrotic tissue within the wound bed. Assessment Active Problems ICD-10 Cellulitis of left lower limb Venous insufficiency (chronic) (peripheral) Non-pressure chronic ulcer of other part of left lower leg with fat layer exposed Plan Wound Cleansing: Wound #1 Left Groin: Cleanse wound with mild soap and water Wound #2 Left,Circumferential Lower Leg: Cleanse wound with mild soap and water Primary Wound Dressing: Wound #1 Left Groin: Silver Alginate Other: - Nystatin powder - PLEASE KEEP THIS AREA  DRY AT ALL TIMES Wound #2 Left,Circumferential Lower Leg: Other: - ACE wrap Secondary Dressing: Wound #1 Left Groin: Dry Gauze Dressing Change Frequency: Wound #  1 Left Groin: Change dressing every day. - and as needed to keep this area dry Wound #2 Left,Circumferential Lower Leg: Change dressing every other day. Follow-up Appointments: Wound #1 Left Groin: Return Appointment in 1 week. Wound #2 Left,Circumferential Lower Leg: Return Appointment in 1 week. Edema Control: Wound #1 Left Groin: Elevate legs to the level of the heart and pump ankles as often as possible Wound #2 Left,Circumferential Lower Leg: Elevate legs to the level of the heart and pump ankles as often as possible Home Health: Wound #1 Left Groin: [place] Nurse may visit PRN to address patient s wound care needs. FACE TO FACE ENCOUNTER: MEDICARE and MEDICAID PATIENTS: I certify that this patient is under my care and that I had a face-to-face encounter that meets the physician face-to-face encounter requirements with this patient on this date. The BRAXSTON, QUINTER ([id number]) encounter with the patient was in whole or in part for the following MEDICAL CONDITION: (primary reason for [place]) MEDICAL NECESSITY: I certify, that based on my findings, NURSING services are a medically necessary home health service. HOME BOUND STATUS: I certify that my clinical findings support that this patient is homebound (i.e., Due to illness or injury, pt requires aid of supportive devices such as crutches, cane, wheelchairs, walkers, the use of special transportation or the assistance of another person to leave their place of residence. There is a normal inability to leave the home and doing so requires considerable and taxing effort. Other absences are for medical reasons / religious services and are infrequent or of short duration when for other reasons). If current dressing causes regression in  wound condition, may D/C ordered dressing product/s and apply Normal Saline Moist Dressing daily until next [place] / Other MD appointment. [employer] of regression in wound condition at [phone]. Please direct any NON-WOUND related issues/requests for orders to patient's Primary Care Physician Wound #2 Left,Circumferential Lower Leg: [place] Nurse may visit PRN to address patient s wound care needs. FACE TO FACE ENCOUNTER: MEDICARE and MEDICAID PATIENTS: I certify that this patient is under my care and that I had a face-to-face encounter that meets the physician face-to-face encounter requirements with this patient on this date. The encounter with the patient was in whole or in part for the following MEDICAL CONDITION: (primary reason for [place]) MEDICAL NECESSITY: I certify, that based on my findings, NURSING services are a medically necessary home health service. HOME BOUND STATUS: I certify that my clinical findings support that this patient is homebound (i.e., Due to illness or injury, pt requires aid of supportive devices such as crutches, cane, wheelchairs, walkers, the use of special transportation or the assistance of another person to leave their place of residence. There is a normal inability to leave the home and doing so requires considerable and taxing effort. Other absences are for medical reasons / religious services and are infrequent or of short duration when for other reasons). If current dressing causes regression in wound condition, may D/C ordered dressing product/s and apply Normal Saline Moist Dressing daily until next [place] / Other MD appointment. [place] of regression in wound condition at [phone]. Please direct any NON-WOUND related issues/requests for orders to patient's Primary Care Physician Medications-please add to medication list.: Wound #1 Left  Groin: P.O. Antibiotics - Continue antibiotics prescribed while in the hospital until complete. Wound #2 Left,Circumferential Lower Leg: P.O. Antibiotics - Continue  antibiotics prescribed while in the hospital until complete. The following medication(s) was prescribed: nystatin topical 100,000 unit/gram powder powder topical applied to the left groin wound with each dressing change until healed starting 10/20/2018 My suggestion currently other than beginning the nystatin powder is gonna be that we continue with the above wound care measures for the next week and the patient is in agreement with plan. He will go ahead and see about ordering his compression stockings in the meantime. We will subsequently see were things stand at follow-up in one week. Please see above for specific wound care orders. We will see patient for re-evaluation in 1 week(s) here in the clinic. If anything worsens or changes patient will contact our office for additional recommendations. Electronic Signature(s) Signed: 10/21/2018 12:12:44 AM By: Worthy Keeler PA-C Entered By: Worthy Keeler on 10/20/2018 08:51:01 Mehring, Moe ([id number]) -------------------------------------------------------------------------------- ROS/PFSH Details Patient Name: Corey James Date of Service: 10/20/2018 8:00 AM Medical Record Number: [id number] Patient Account Number: [id number] Date of Birth/Sex: Jul 14, 1955 (63 y.o. M) Treating RN: Montey Hora Primary Care Provider: [name] Other Clinician: Referring Provider: [name] Treating Provider/Extender: Melburn Hake, Tynlee Bayle Weeks in Treatment: 2 Information Obtained From Patient Constitutional Symptoms (General Health) Complaints and Symptoms: Negative for: Fatigue; Fever; Chills; Marked Weight Change Respiratory Complaints and Symptoms: Negative for: Chronic or frequent coughs; Shortness of Breath Cardiovascular Complaints and Symptoms: Positive for: LE edema Negative  for: Chest pain Medical History: Positive for: Hypertension Psychiatric Complaints and Symptoms: Negative for: Anxiety; Claustrophobia Gastrointestinal Medical History: Positive for: Hepatitis C - Taking Medication Negative for: Cirrhosis ; Colitis; Crohnos; Hepatitis A; Hepatitis B Immunizations Pneumococcal Vaccine: Received Pneumococcal Vaccination: No Implantable Devices None Hospitalization / Surgery History Type of Hospitalization/Surgery [facility]/Cellulitis Family and Social History Cancer: Yes - Father; Diabetes: No; Heart Disease: No; Hypertension: No; Kidney Disease: No; Lung Disease: No; Seizures: No; Stroke: Yes - Father; Thyroid Problems: No; Tuberculosis: No; Current every day smoker; Alcohol Use: Rarely; Drug Use: No History; Caffeine Use: Daily Physician Alastair Hennes, Wayne ([id number]) I have reviewed and agree with the above information. Electronic Signature(s) Signed: 10/20/2018 4:36:42 PM By: Montey Hora Signed: 10/21/2018 12:12:44 AM By: Worthy Keeler PA-C Entered By: Worthy Keeler on 10/20/2018 08:48:57 Loftin, Shray ([id number]) -------------------------------------------------------------------------------- SuperBill Details Patient Name: Corey James Date of Service: 10/20/2018 Medical Record Number: [id number] Patient Account Number: [id number] Date of Birth/Sex: [birth year] (63 y.o. M) Treating RN: Montey Hora Primary Care Provider: [name] Other Clinician: Referring Provider: [name] Treating Provider/Extender: Melburn Hake, Madoc Holquin Weeks in Treatment: 2 Diagnosis Coding ICD-10 Codes Code Description L03.116 Cellulitis of left lower limb I87.2 Venous insufficiency (chronic) (peripheral) L97.822 Non-pressure chronic ulcer of other part of left lower leg with fat layer exposed Facility Procedures CPT4 Code: 07867544 Description: 99214 - WOUND CARE VISIT-LEV 4 EST PT Modifier: Quantity: 1 Physician Procedures CPT4 Code  Description: 9201007 99214 - WC PHYS LEVEL 4 - EST PT ICD-10 Diagnosis Description L03.116 Cellulitis of left lower limb I87.2 Venous insufficiency (chronic) (peripheral) L97.822 Non-pressure chronic ulcer of other part of left lower leg wit Modifier: h fat layer expos Quantity: 1 ed Electronic Signature(s) Signed: 10/20/2018 9:08:12 AM By: Montey Hora Signed: 10/21/2018 12:12:44 AM By: Worthy Keeler PA-C Entered By: Montey Hora on 10/20/2018 09:08:11

## 2018-10-23 NOTE — Progress Notes (Addendum)
Corey James, Corey James ([id number]) Visit Report for 10/20/2018 Arrival Information Details Patient Name: Corey James, Corey James Date of Service: 10/20/2018 8:00 AM Medical Record Number: [id number] Patient Account Number: [id number] Date of Birth/Sex: 06-19-1955 (63 y.o. M) Treating RN: Montey Hora Primary Care Kaine Mcquillen: [name] Other Clinician: Referring Courtenay Creger: [name] Treating Pelham Hennick/Extender: Melburn Hake, HOYT Weeks in Treatment: 2 Visit Information History Since Last Visit Added or deleted any medications: No Patient Arrived: Ambulatory Any new allergies or adverse reactions: No Arrival Time: 08:10 Had a fall or experienced change in No Accompanied By: self activities of daily living that may affect Transfer Assistance: Manual risk of falls: Patient Identification Verified: Yes Signs or symptoms of abuse/neglect since last visito No Secondary Verification Process Completed: Yes Hospitalized since last visit: No Patient Has Alerts: Yes Implantable device outside of the clinic excluding No cellular tissue based products placed in the center since last visit: Pain Present Now: Yes Electronic Signature(s) Signed: 10/23/2018 3:48:49 PM By: Lorine Bears RCP, RRT, CHT Entered By: Lorine Bears on 10/20/2018 08:11:26 Santillanes, Rawad ([id number]) -------------------------------------------------------------------------------- Clinic Level of Care Assessment Details Patient Name: Corey James Date of Service: 10/20/2018 8:00 AM Medical Record Number: [id number] Patient Account Number: [id number] Date of Birth/Sex: [birth year] (63 y.o. M) Treating RN: Montey Hora Primary Care Syliva Mee: [name] Other Clinician: Referring Lonie Rummell: [name] Treating Lillyian Heidt/Extender: Melburn Hake, HOYT Weeks in Treatment: 2 Clinic Level of Care Assessment Items TOOL 4 Quantity Score []  - Use when only an EandM is performed on FOLLOW-UP visit 0 ASSESSMENTS - Nursing  Assessment / Reassessment X - Reassessment of Co-morbidities (includes updates in patient status) 1 10 X- 1 5 Reassessment of Adherence to Treatment Plan ASSESSMENTS - Wound and Skin Assessment / Reassessment []  - Simple Wound Assessment / Reassessment - one wound 0 X- 2 5 Complex Wound Assessment / Reassessment - multiple wounds []  - 0 Dermatologic / Skin Assessment (not related to wound area) ASSESSMENTS - Focused Assessment X - Circumferential Edema Measurements - multi extremities 1 5 []  - 0 Nutritional Assessment / Counseling / Intervention X- 1 5 Lower Extremity Assessment (monofilament, tuning fork, pulses) []  - 0 Peripheral Arterial Disease Assessment (using hand held doppler) ASSESSMENTS - Ostomy and/or Continence Assessment and Care []  - Incontinence Assessment and Management 0 []  - 0 Ostomy Care Assessment and Management (repouching, etc.) PROCESS - Coordination of Care X - Simple Patient / Family Education for ongoing care 1 15 []  - 0 Complex (extensive) Patient / Family Education for ongoing care X- 1 10 Staff obtains Programmer, systems, Records, Test Results / Process Orders []  - 0 Staff telephones HHA, Nursing Homes / Clarify orders / etc []  - 0 Routine Transfer to another Facility (non-emergent condition) []  - 0 Routine Hospital Admission (non-emergent condition) []  - 0 New Admissions / Biomedical engineer / Ordering NPWT, Apligraf, etc. []  - 0 Emergency Hospital Admission (emergent condition) X- 1 10 Simple Discharge Coordination [place], Curtis ([id number]) []  - 0 Complex (extensive) Discharge Coordination PROCESS - Special Needs []  - Pediatric / Minor Patient Management 0 []  - 0 Isolation Patient Management []  - 0 Hearing / Language / Visual special needs []  - 0 Assessment of Community assistance (transportation, D/C planning, etc.) []  - 0 Additional assistance / Altered mentation []  - 0 Support Surface(s) Assessment (bed, cushion, seat,  etc.) INTERVENTIONS - Wound Cleansing / Measurement []  - Simple Wound Cleansing - one wound 0 X- 2 5 Complex Wound Cleansing - multiple wounds X- 1 5 Wound Imaging (photographs - any number  of wounds) []  - 0 Wound Tracing (instead of photographs) []  - 0 Simple Wound Measurement - one wound X- 2 5 Complex Wound Measurement - multiple wounds INTERVENTIONS - Wound Dressings X - Small Wound Dressing one or multiple wounds 2 10 []  - 0 Medium Wound Dressing one or multiple wounds []  - 0 Large Wound Dressing one or multiple wounds X- 1 5 Application of Medications - topical []  - 0 Application of Medications - injection INTERVENTIONS - Miscellaneous []  - External ear exam 0 []  - 0 Specimen Collection (cultures, biopsies, blood, body fluids, etc.) []  - 0 Specimen(s) / Culture(s) sent or taken to Lab for analysis []  - 0 Patient Transfer (multiple staff / Civil Service fast streamer / Similar devices) []  - 0 Simple Staple / Suture removal (25 or less) []  - 0 Complex Staple / Suture removal (26 or more) []  - 0 Hypo / Hyperglycemic Management (close monitor of Blood Glucose) []  - 0 Ankle / Brachial Index (ABI) - do not check if billed separately X- 1 5 Vital Signs Groot, Wladyslaw ([id number]) Has the patient been seen at the hospital within the last three years: Yes Total Score: 125 Level Of Care: ____ Electronic Signature(s) Signed: 10/20/2018 4:36:42 PM By: Montey Hora Entered By: Montey Hora on 10/20/2018 Somerset, Tornillo ([id number]) -------------------------------------------------------------------------------- Encounter Discharge Information Details Patient Name: Corey James Date of Service: 10/20/2018 8:00 AM Medical Record Number: [id number] Patient Account Number: [id number] Date of Birth/Sex: November 09, 1955 (63 y.o. M) Treating RN: Cornell Barman Primary Care Rosslyn Pasion: [name] Other Clinician: Referring Aliannah Holstrom: [name] Treating Olvin Rohr/Extender: Sharalyn Ink in Treatment: 2 Encounter Discharge Information Items Discharge Condition: Stable Ambulatory Status: Ambulatory Discharge Destination: Home Transportation: Private Auto Accompanied By: self Schedule Follow-up Appointment: Yes Clinical Summary of Care: Electronic Signature(s) Signed: 10/20/2018 4:26:08 PM By: Gretta Cool, BSN, RN, CWS, Kim RN, BSN Entered By: Gretta Cool, BSN, RN, CWS, Kim on 10/20/2018 16:26:07 Corey James ([id number]) -------------------------------------------------------------------------------- Lower Extremity Assessment Details Patient Name: Corey James Date of Service: 10/20/2018 8:00 AM Medical Record Number: [id number] Patient Account Number: [id number] Date of Birth/Sex: 1955/10/24 (63 y.o. M) Treating RN: Montey Hora Primary Care Kloey Cazarez: [name] Other Clinician: Referring Marelyn Rouser: [name] Treating Owenn Rothermel/Extender: STONE III, HOYT Weeks in Treatment: 2 Edema Assessment Assessed: [Left: No] [Right: No] Edema: [Left: Ye] [Right: s] Calf Left: Right: Point of Measurement: 34 cm From Medial Instep 41.5 cm cm Ankle Left: Right: Point of Measurement: 12 cm From Medial Instep 24 cm cm Vascular Assessment Pulses: Dorsalis Pedis Palpable: [Left:Yes Yes] Electronic Signature(s) Signed: 10/20/2018 4:36:42 PM By: Montey Hora Entered By: Montey Hora on 10/20/2018 08:33:54 Cuttino, Abby ([id number]) -------------------------------------------------------------------------------- Multi Wound Chart Details Patient Name: Corey James Date of Service: 10/20/2018 8:00 AM Medical Record Number: [id number] Patient Account Number: [id number] Date of Birth/Sex: [birth year] (63 y.o. M) Treating RN: Montey Hora Primary Care Aily Tzeng: [name] Other Clinician: Referring Lue Dubu[place]: [name] Treating Rhone Ozaki/Extender: STONE III, HOYT Weeks in Treatment: 2 Vital Signs Height(in): 69 Pulse(bpm): 94 Weight(lbs): 336 Blood Pressure(mmHg):  160/70 Body Mass Index(BMI): 50 Temperature(F): 97.8 Respiratory Rate 22 (breaths/min): Photos: [N/A:N/A] Wound Location: Left Groin Left, Circumferential Lower N/A Leg Wounding Event: Surgical Injury Gradually Appeared N/A Primary Etiology: Cellulitis Cellulitis N/A Comorbid History: Hypertension, Hepatitis C Hypertension, Hepatitis C N/A Date Acquired: 07/07/2018 09/04/2018 N/A Weeks of Treatment: 2 2 N/A Wound Status: Open Open N/A Measurements L x W x D 1.6x7.4x0.1 0.1x0.1x0.1 N/A (cm) Area (cm) : 9.299 0.008 N/A Volume (cm) : 0.93 0.001 N/A % Reduction  in Area: 56.10% 100.00% N/A % Reduction in Volume: 56.20% 100.00% N/A Classification: Full Thickness Without Full Thickness Without N/A Exposed Support Structures Exposed Support Structures Exudate Amount: Large None Present N/A Exudate Type: Serous N/A N/A Exudate Color: amber N/A N/A Wound Margin: Flat and Intact Indistinct, nonvisible N/A Leinweber, Fares ([id number]) Granulation Amount: Large (67-100%) None Present (0%) N/A Granulation Quality: Pink N/A N/A Necrotic Amount: None Present (0%) None Present (0%) N/A Exposed Structures: Fascia: No Fascia: No N/A Fat Layer (Subcutaneous Fat Layer (Subcutaneous Tissue) Exposed: No Tissue) Exposed: No Tendon: No Tendon: No Muscle: No Muscle: No Joint: No Joint: No Bone: No Bone: No Limited to Skin Breakdown Limited to Skin Breakdown Epithelialization: Small (1-33%) Large (67-100%) N/A Treatment Notes Electronic Signature(s) Signed: 10/20/2018 4:36:42 PM By: Montey Hora Entered By: Montey Hora on 10/20/2018 08:38:59 Stankowski, Zayd ([id number]) -------------------------------------------------------------------------------- Multi-Disciplinary Care Plan Details Patient Name: Corey James Date of Service: 10/20/2018 8:00 AM Medical Record Number: [id number] Patient Account Number: [id number] Date of Birth/Sex: 10-23-1955 (63 y.o. M) Treating RN: Montey Hora Primary Care Velvie Thomaston: [name] Other Clinician: Referring Veretta Sabourin: [name] Treating Terek Bee/Extender: Melburn Hake, HOYT Weeks in Treatment: 2 Active Inactive Electronic Signature(s) Signed: 11/22/2018 6:22:48 PM By: Gretta Cool, BSN, RN, CWS, Kim RN, BSN Signed: 01/19/2019 1:03:37 PM By: Montey Hora Previous Signature: 10/20/2018 4:36:42 PM Version By: Montey Hora Entered By: Gretta Cool BSN, RN, CWS, Kim on 11/22/2018 18:22:48 Blackston, Essa ([id number]) -------------------------------------------------------------------------------- Pain Assessment Details Patient Name: Corey James Date of Service: 10/20/2018 8:00 AM Medical Record Number: [id number] Patient Account Number: [id number] Date of Birth/Sex: 1955-12-28 (63 y.o. M) Treating RN: Montey Hora Primary Care Ratasha Fabre: [name] Other Clinician: Referring Braylon Grenda: [name] Treating Halley Kincer/Extender: Melburn Hake, HOYT Weeks in Treatment: 2 Active Problems Location of Pain Severity and Description of Pain Patient Has Paino Yes Site Locations Rate the pain. Current Pain Level: 4 Pain Management and Medication Current Pain Management: Electronic Signature(s) Signed: 10/20/2018 4:36:42 PM By: Montey Hora Signed: 10/23/2018 3:48:49 PM By: Lorine Bears RCP, RRT, CHT Entered By: Lorine Bears on 10/20/2018 08:11:39 Connell, Cejay ([id number]) -------------------------------------------------------------------------------- Patient/Caregiver Education Details Patient Name: Corey James Date of Service: 10/20/2018 8:00 AM Medical Record Number: [id number] Patient Account Number: [id number] Date of Birth/Gender: 1955/05/28 (63 y.o. M) Treating RN: Montey Hora Primary Care Physician: [name] Other Clinician: Referring Physician: [name] Treating Physician/Extender: Sharalyn Ink in Treatment: 2 Education Assessment Education Provided To: Patient Education Topics  Provided Wound/Skin Impairment: Handouts: Other: keep groin wound dry Methods: Demonstration, Explain/Verbal Responses: State content correctly Electronic Signature(s) Signed: 10/20/2018 4:36:42 PM By: Montey Hora Entered By: Montey Hora on 10/20/2018 09:08:30 [place]bury, Madoc ([id number]) -------------------------------------------------------------------------------- Wound Assessment Details Patient Name: Corey James Date of Service: 10/20/2018 8:00 AM Medical Record Number: [id number] Patient Account Number: [id number] Date of Birth/Sex: Oct 07, 1955 (63 y.o. M) Treating RN: Montey Hora Primary Care Rett Stehlik: [name] Other Clinician: Referring Shynia Daleo: [name] Treating Dona Klemann/Extender: Melburn Hake, HOYT Weeks in Treatment: 2 Wound Status Wound Number: 1 Primary Etiology: Cellulitis Wound Location: Left Groin Wound Status: Open Wounding Event: Surgical Injury Comorbid History: Hypertension, Hepatitis C Date Acquired: 07/07/2018 Weeks Of Treatment: 2 Clustered Wound: No Photos Wound Measurements Length: (cm) 1.6 Width: (cm) 7.4 Depth: (cm) 0.1 Area: (cm) 9.299 Volume: (cm) 0.93 % Reduction in Area: 56.1% % Reduction in Volume: 56.2% Epithelialization: Small (1-33%) Tunneling: No Undermining: No Wound Description Full Thickness Without Exposed Support Classification: Structures Wound Margin: Flat and Intact Exudate Large Amount: Exudate Type: Serous Exudate Color:  amber Foul Odor After Cleansing: No Slough/Fibrino No Wound Bed Granulation Amount: Large (67-100%) Exposed Structure Granulation Quality: Pink Fascia Exposed: No Necrotic Amount: None Present (0%) Fat Layer (Subcutaneous Tissue) Exposed: No Tendon Exposed: No Muscle Exposed: No Joint Exposed: No Bone Exposed: No Limited to Skin Carlin Attridge, Arik ([id number]) Electronic Signature(s) Signed: 10/20/2018 4:36:42 PM By: Montey Hora Entered By: Montey Hora on 10/20/2018  08:30:22 Corsi, Galdino ([id number]) -------------------------------------------------------------------------------- Wound Assessment Details Patient Name: Corey James Date of Service: 10/20/2018 8:00 AM Medical Record Number: [id number] Patient Account Number: [id number] Date of Birth/Sex: [birth year] (63 y.o. M) Treating RN: Montey Hora Primary Care Auther Lyerly: [name] Other Clinician: Referring Charese Abundis: [name] Treating Aisa Schoeppner/Extender: Melburn Hake, HOYT Weeks in Treatment: 2 Wound Status Wound Number: 2 Primary Etiology: Cellulitis Wound Location: Left, Circumferential Lower Leg Wound Status: Open Wounding Event: Gradually Appeared Comorbid History: Hypertension, Hepatitis C Date Acquired: 09/04/2018 Weeks Of Treatment: 2 Clustered Wound: No Photos Wound Measurements Length: (cm) 0.1 Width: (cm) 0.1 Depth: (cm) 0.1 Area: (cm) 0.008 Volume: (cm) 0.001 % Reduction in Area: 100% % Reduction in Volume: 100% Epithelialization: Large (67-100%) Tunneling: No Undermining: No Wound Description Full Thickness Without Exposed Support Foul Odo Classification: Structures Slough/F Wound Margin: Indistinct, nonvisible Exudate None Present Amount: r After Cleansing: No ibrino Yes Wound Bed Granulation Amount: None Present (0%) Exposed Structure Necrotic Amount: None Present (0%) Fascia Exposed: No Fat Layer (Subcutaneous Tissue) Exposed: No Tendon Exposed: No Muscle Exposed: No Joint Exposed: No Bone Exposed: No Limited to Skin Breakdown Electronic Signature(s) HARTOG, Jahmai ([id number]) Signed: 10/20/2018 4:36:42 PM By: Montey Hora Entered By: Montey Hora on 10/20/2018 08:31:58 Hankerson, Geoff ([id number]) -------------------------------------------------------------------------------- [place] Details Patient Name: Corey James Date of Service: 10/20/2018 8:00 AM Medical Record Number: [id number] Patient Account Number: [id number] Date of Birth/Sex:  02/25/1956 (63 y.o. M) Treating RN: Montey Hora Primary Care Quintavis Brands: [name] Other Clinician: Referring Shannan Garfinkel: [name] Treating Momoko Slezak/Extender: Melburn Hake, HOYT Weeks in Treatment: 2 Vital Signs Time Taken: 08:11 Temperature (F): 97.8 Height (in): 69 Pulse (bpm): 94 Weight (lbs): 336 Respiratory Rate (breaths/min): 22 Body Mass Index (BMI): 49.6 Blood Pressure (mmHg): 160/70 Reference Range: 80 - 120 mg / dl Electronic Signature(s) Signed: 10/23/2018 3:48:49 PM By: Lorine Bears RCP, RRT, CHT Entered By: Lorine Bears on 10/20/2018 08:16:17

## 2018-10-24 ENCOUNTER — Other Ambulatory Visit: Payer: Self-pay

## 2018-10-24 ENCOUNTER — Telehealth: Payer: Self-pay

## 2018-10-24 ENCOUNTER — Ambulatory Visit: Payer: Self-pay

## 2018-10-24 DIAGNOSIS — L02419 Cutaneous abscess of limb, unspecified: Secondary | ICD-10-CM

## 2018-10-24 DIAGNOSIS — Z6841 Body Mass Index (BMI) 40.0 and over, adult: Secondary | ICD-10-CM

## 2018-10-24 NOTE — Chronic Care Management (AMB) (Signed)
  Care Management   Note  10/24/2018 Name: Corey James MRN: [id number] DOB: 08/10/1955  Care Coordination: Successful telephone encounter to [name], 63 year old malewho sees Corey Ensign, FNP for primary care. Ms. Corey James the CCM team to consult the patient forfinancial and community resources and has been engaged with CCM LCSW [name]. Ms Corey James requested CCM RN CM contact patient secondary to his ongoing leg infection for possible education and care coordination.Patient's last office visit was 09/14/2018 after hospitalization for cellulitis of left lower leg. RN CM was unable to reach patient 10/19/2018 at 9:00 for initial telephone assessment as scheduled. Today RN CM was about to reach patient and new appointment scheduled . Plan: Appointment rescheduled for 10/30/2018 at 10:00    Corey Singleton E. Rollene Rotunda, RN, BSN Nurse Care Coordinator [facility] Care Management  [phone]

## 2018-10-26 ENCOUNTER — Encounter (INDEPENDENT_AMBULATORY_CARE_PROVIDER_SITE_OTHER): Payer: Medicaid Other | Admitting: Vascular Surgery

## 2018-10-26 ENCOUNTER — Other Ambulatory Visit (INDEPENDENT_AMBULATORY_CARE_PROVIDER_SITE_OTHER): Payer: Self-pay | Admitting: Nurse Practitioner

## 2018-10-26 ENCOUNTER — Encounter (INDEPENDENT_AMBULATORY_CARE_PROVIDER_SITE_OTHER): Payer: Medicaid Other

## 2018-10-26 DIAGNOSIS — L03116 Cellulitis of left lower limb: Secondary | ICD-10-CM

## 2018-10-26 DIAGNOSIS — I872 Venous insufficiency (chronic) (peripheral): Secondary | ICD-10-CM

## 2018-10-27 ENCOUNTER — Encounter (INDEPENDENT_AMBULATORY_CARE_PROVIDER_SITE_OTHER): Payer: Medicaid Other | Admitting: Nurse Practitioner

## 2018-10-27 ENCOUNTER — Ambulatory Visit: Payer: Medicaid Other | Admitting: Physician Assistant

## 2018-10-27 ENCOUNTER — Encounter (INDEPENDENT_AMBULATORY_CARE_PROVIDER_SITE_OTHER): Payer: Medicaid Other

## 2018-10-30 ENCOUNTER — Other Ambulatory Visit: Payer: Self-pay

## 2018-10-30 ENCOUNTER — Ambulatory Visit: Payer: Self-pay

## 2018-10-30 ENCOUNTER — Ambulatory Visit (INDEPENDENT_AMBULATORY_CARE_PROVIDER_SITE_OTHER): Payer: Medicaid Other | Admitting: Surgery

## 2018-10-30 ENCOUNTER — Encounter: Payer: Self-pay | Admitting: Surgery

## 2018-10-30 VITALS — BP 154/85 | HR 76 | Temp 97.7°F | Ht 69.0 in | Wt 337.0 lb

## 2018-10-30 DIAGNOSIS — M5136 Other intervertebral disc degeneration, lumbar region: Secondary | ICD-10-CM | POA: Diagnosis not present

## 2018-10-30 DIAGNOSIS — L97221 Non-pressure chronic ulcer of left calf limited to breakdown of skin: Secondary | ICD-10-CM

## 2018-10-30 DIAGNOSIS — I872 Venous insufficiency (chronic) (peripheral): Secondary | ICD-10-CM

## 2018-10-30 DIAGNOSIS — M1610 Unilateral primary osteoarthritis, unspecified hip: Secondary | ICD-10-CM | POA: Diagnosis not present

## 2018-10-30 NOTE — Chronic Care Management (AMB) (Signed)
  Care Management   Note  10/30/2018 Name: Corey James MRN: [id number] DOB: 27-Apr-1956  Care Coordination: Successful telephone call to [name], for his initial assessment and goal setting appointment as previously scheduled for 10:15. 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  Plan: Will await call back    Cowan Pilar E. Rollene Rotunda, RN, BSN Nurse Care Coordinator [facility] Care Management  [phone]

## 2018-10-30 NOTE — Patient Instructions (Signed)
Return as needed. Wash the area daily.

## 2018-10-30 NOTE — Progress Notes (Signed)
Outpatient Surgical Follow Up  10/30/2018  Alexandre Faries is an 63 y.o. male.   Chief Complaint  Patient presents with  . 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  He comes for a wound check.  Is leg has completely healed.  Denies any fevers any chills.  Does have a chronic narcotic abuse issues and was asking me for narcotics or steroids.  Apparently at [facility] clinic he was dismissed due to inappropriate behavior.  Past Medical History:  Diagnosis Date  . Back pain   . Cellulitis   . Disc degeneration, lumbosacral   . Hepatitis C     Past Surgical History:  Procedure Laterality Date  . APPLICATION OF WOUND VAC Left 07/14/2018   Procedure: WOUND VAC CHANGE;  Surgeon: Olean Ree, MD;  Location: [facility] ORS;  Service: General;  Laterality: Left;  . INCISION AND DRAINAGE ABSCESS Left 07/11/2018   Procedure: INCISION AND DRAINAGE ABSCESS-LEFT THIGH;  Surgeon: [name], MD;  Location: [facility] ORS;  Service: General;  Laterality: Left;  . WOUND DEBRIDEMENT Left 07/14/2018   Procedure: POSSIBLE DEBRIDEMENT WOUND-LEFT THIGH;  Surgeon: Olean Ree, MD;  Location: [facility] ORS;  Service: General;  Laterality: Left;    Family History  Problem Relation Age of Onset  . Dementia Mother   . Alzheimer's disease Mother   . CVA Father   . Cancer - Colon Father   . Heart attack Brother   . Hypertension Brother     Social History:  reports that he has been smoking cigarettes. He started smoking about 7 weeks ago. He has a 40.00 pack-year smoking history. He uses smokeless tobacco. He reports current alcohol use. He reports previous drug use. Drug: Marijuana.  Allergies:  Allergies  Allergen Reactions  . Penicillins Itching and Rash    Did it involve swelling of the face/tongue/throat, SOB, or low BP? Unknown Did it  involve sudden or severe rash/hives, skin peeling, or any reaction on the inside of your mouth or nose? Yes Did you need to seek medical attention at a hospital or doctor's office? Unknown When did it last happen?Unknown If all above answers are "NO", may proceed with cephalosporin use.     Medications reviewed.    ROS Full ROS performed and is otherwise negative other than what is stated in HPI   BP (!) 154/85   Pulse 76   Temp 97.7 F (36.5 C) (Skin)   Ht 5\' 9"  (1.753 m)   Wt (!) 337 lb (152.9 kg)   SpO2 93%   BMI 49.77 kg/m   Physical Exam Vitals signs and nursing note reviewed.  Constitutional:      Appearance: He is obese. He is not ill-appearing.  Pulmonary:     Effort: Pulmonary effort is normal. No respiratory distress.  Musculoskeletal: Normal range of motion.        General: Swelling present.     Comments: Resolved left lower extremity ulcerations.  Scars on the left thigh there is some hyper granulation tissue there is no cellulitis or evidence of soft tissue infection or abscess  Skin:    General: Skin is warm and dry.     Capillary Refill: Capillary refill takes less than 2 seconds.  Neurological:     General: No focal deficit present.  Mental Status: He is oriented to person, place, and time.  Psychiatric:        Mood and Affect: Mood normal.        Thought Content: Thought content normal.        Judgment: Judgment normal.      Assessment/Plan: Resolved cellulitis and ulceration of the lower leg.  As far as his chronic pain he has been for a narcotic or steroids and I gently refused to give him any narcotics at this time.  I encouraged him to seek advice from primary care provider.  I normally do not prescribe steroids either. He does have an upcoming pain clinic visit. No surgical issues at this time.  RTC as needed   Caroleen Hamman, MD [facility] General Surgeon

## 2018-10-30 NOTE — Chronic Care Management (AMB) (Signed)
    Care Management   Unsuccessful Call Note 10/30/2018 Name: Corey James MRN: [id number] DOB: 28-Oct-1955    Corey James is a 63 year old malewho sees [name], FNP for primary care. Ms. [name] the CCM team to consult the patient for chronic care management and care coordination specifically related to his uninsured status and need for community resources. Referral was placed4/23/2020 during PCP visit for Hospital discharge follow up secondary to cellulitis of left lower leg.   Was unable to reach patient via telephone this afternoon to reschedule initial assessment appointment. I have left HIPAA compliant voicemail asking patient to return my call. (unsuccessful outreach #2).   Plan: Will follow-up within 7 business days via telephone.      Clydene Burack E. Rollene Rotunda, RN, BSN Nurse Care Coordinator [facility] Care Management  [phone]

## 2018-11-02 ENCOUNTER — Ambulatory Visit: Payer: Medicaid Other | Admitting: Infectious Diseases

## 2018-11-02 ENCOUNTER — Other Ambulatory Visit: Payer: Self-pay

## 2018-11-03 ENCOUNTER — Telehealth: Payer: Self-pay

## 2018-11-03 ENCOUNTER — Ambulatory Visit: Payer: Self-pay

## 2018-11-03 DIAGNOSIS — L02419 Cutaneous abscess of limb, unspecified: Secondary | ICD-10-CM

## 2018-11-03 DIAGNOSIS — Z6841 Body Mass Index (BMI) 40.0 and over, adult: Secondary | ICD-10-CM

## 2018-11-03 NOTE — Chronic Care Management (AMB) (Signed)
    Care Management   Unsuccessful Call Note 10/30/2018 Name: Corey James     MRN: [id number]       DOB: 1955-08-05   Corey James a 63year old malewho seesEmily Uvaldo James, FNPfor primary care. Ms. Corey James the CCM team to consult the patient forchronic care management and care coordination specifically related to his uninsured status and need for community resources.Referral was placed4/23/2020 during PCP visit for Hospital discharge follow up secondary to cellulitis of left lower leg.  Was unable to reach patient via telephone this afternoon to reschedule initial assessment appointment. Ihave left HIPAA compliant voicemail asking patient to return my call. (unsuccessful outreach #3).  Plan: CCM Team has been unsuccessful contacting/maintaining contact with Corey James. Multiple messages have been left requesting call back. Outreach attempts will be discontinued however CCM RN CM will remain available if assistance is needed in the future.   Corey James E. Corey Rotunda, RN, BSN Nurse Care Coordinator [facility] / [facility] Care Management  [phone]

## 2018-11-03 NOTE — Chronic Care Management (AMB) (Signed)
  Care Management   Note  11/03/2018 Name: Corey James MRN: [id number] DOB: [birth year]  Care Coordination: Incoming call from patient in response to voicemail left by RN CM this morning. Patient states his wounds are almost healed. He appreciates RN CM following up but feels his is not in need of services at this time. He has been provided contact information for RN CM and LCSW Chrystal Land and encouraged to call if additional needs arise.    Zionah Criswell E. Rollene Rotunda, RN, BSN Nurse Care Coordinator [facility] / [facility] Care Management  [phone]

## 2018-11-06 ENCOUNTER — Telehealth: Payer: Self-pay | Admitting: Gastroenterology

## 2018-11-06 NOTE — Telephone Encounter (Signed)
Pt left vm stating he has not heard from [facility] he asked that Ginger call them and then call pt

## 2018-11-06 NOTE — Telephone Encounter (Signed)
Pt left vm for Ginger to receive a call  From her

## 2018-11-07 DIAGNOSIS — M545 Low back pain: Secondary | ICD-10-CM | POA: Diagnosis not present

## 2018-11-08 DIAGNOSIS — Z79899 Other long term (current) drug therapy: Secondary | ICD-10-CM | POA: Diagnosis not present

## 2018-11-09 ENCOUNTER — Ambulatory Visit: Payer: Medicaid Other | Admitting: Family Medicine

## 2018-11-09 ENCOUNTER — Ambulatory Visit: Payer: Medicaid Other | Admitting: Infectious Diseases

## 2018-11-15 DIAGNOSIS — M5416 Radiculopathy, lumbar region: Secondary | ICD-10-CM | POA: Diagnosis not present

## 2018-12-01 ENCOUNTER — Telehealth: Payer: Self-pay

## 2018-12-01 NOTE — Telephone Encounter (Signed)
Patient called this RNCM stating that "yall have turned me in to the collection agency". I asked that he provide copy of bill however, out of frustration he has destroyed the bill. He has had Medicaid coverage since January but feels these are DOS prior to coverage.  I explained that I would need to have a bill in order to review for resources. I also advised that he reach out to his DSS case worker- he agreed.

## 2018-12-05 ENCOUNTER — Ambulatory Visit: Payer: Medicaid Other | Admitting: Gastroenterology

## 2018-12-05 ENCOUNTER — Encounter (INDEPENDENT_AMBULATORY_CARE_PROVIDER_SITE_OTHER): Payer: Self-pay

## 2018-12-05 ENCOUNTER — Encounter: Payer: Self-pay | Admitting: Gastroenterology

## 2018-12-05 ENCOUNTER — Other Ambulatory Visit: Payer: Self-pay

## 2018-12-05 VITALS — BP 134/76 | HR 112 | Temp 97.9°F | Ht 69.0 in | Wt 336.8 lb

## 2018-12-05 DIAGNOSIS — B182 Chronic viral hepatitis C: Secondary | ICD-10-CM | POA: Diagnosis not present

## 2018-12-05 NOTE — Progress Notes (Signed)
Primary Care Physician: [name], FNP  Primary Gastroenterologist:  Dr. Lucilla Lame  Chief Complaint  Patient presents with  . follow up Hep C    treatment completed    HPI: Corey James is a 63 y.o. male here for follow-up of his hepatitis C.  The patient finished his treatment and states that he did not have any side effects of the treatment.  The patient had labs done in May that showed his liver enzymes to be normal.  He denies any abdominal pain nausea vomiting fevers or chills.  Current Outpatient Medications  Medication Sig Dispense Refill  . acetaminophen (TYLENOL) 500 MG tablet Take 1 tablet (500 mg total) by mouth every 6 (six) hours as needed for mild pain or fever. 30 tablet 0  . cyclobenzaprine (FLEXERIL) 10 MG tablet Take 10 mg by mouth as directed.     . docusate sodium (COLACE) 100 MG capsule Take 100 mg by mouth 2 (two) times daily.    [name] gabapentin (NEURONTIN) 300 MG capsule Take 300-600 mg by mouth See admin instructions. Take 1 capsule (300mg ) by mouth every morning and up to 2 capsules (600mg ) by mouth at bedtime    . HYDROcodone-acetaminophen (NORCO) 7.5-325 MG tablet Take 1 tablet by mouth every 4 (four) hours as needed for severe pain.     [name] ALPRAZolam (XANAX) 0.5 MG tablet AFTER CONSENT IS SIGNED TAKE 2 TABS. REPEAT ONLY AFTER DISCUSSED WITH STAFF DO NOT TAKE WITH OPIOID.    [name] silver sulfADIAZINE (SILVADENE) 1 % cream Apply to affected area daily (Patient not taking: Reported on 12/05/2018) 50 g 1  . sulfamethoxazole-trimethoprim (BACTRIM DS) 800-160 MG tablet Take 1 tablet by mouth every 12 (twelve) hours. (Patient not taking: Reported on 12/05/2018) 20 tablet 0  . sulfamethoxazole-trimethoprim (BACTRIM DS) 800-160 MG tablet Take 1 tablet by mouth 2 (two) times daily. (Patient not taking: Reported on 12/05/2018) 28 tablet 0   No current facility-administered medications for this visit.     Allergies as of 12/05/2018 - Review Complete 12/05/2018  Allergen  Reaction Noted  . Penicillins Itching and Rash 07/03/2011    ROS:  General: Negative for anorexia, weight loss, fever, chills, fatigue, weakness. ENT: Negative for hoarseness, difficulty swallowing , nasal congestion. CV: Negative for chest pain, angina, palpitations, dyspnea on exertion, peripheral edema.  Respiratory: Negative for dyspnea at rest, dyspnea on exertion, cough, sputum, wheezing.  GI: See history of present illness. GU:  Negative for dysuria, hematuria, urinary incontinence, urinary frequency, nocturnal urination.  Endo: Negative for unusual weight change.    Physical Examination:   BP 134/76   Pulse (!) 112   Temp 97.9 F (36.6 C) (Oral)   Ht 5\' 9"  (1.753 m)   Wt (!) 336 lb 12.8 oz (152.8 kg)   BMI 49.74 kg/m   General: Well-nourished, Obese in no acute distress.  Eyes: No icterus. Conjunctivae pink. Mouth: Oropharyngeal mucosa moist and pink , no lesions erythema or exudate. Lungs: Clear to auscultation bilaterally. Non-labored. Heart: Regular rate and rhythm, no murmurs rubs or gallops.  Abdomen: Bowel sounds are normal, nontender, nondistended, no hepatosplenomegaly or masses, no abdominal bruits or hernia , no rebound or guarding.   Extremities: No lower extremity edema. No clubbing or deformities. Neuro: Alert and oriented x 3.  Grossly intact. Skin: Warm and dry, no jaundice.   Psych: Alert and cooperative, normal mood and affect.  Labs:    Imaging Studies: No results found.  Assessment and Plan:  Corey James is a 63 y.o. y/o male who comes in after being treated for hepatitis C.  The patient had no side effects from the hepatitis C.  The patient's most recent labs while on the medication showed normalization of his liver enzymes.  The patient has been told that we will check labs now but true clearance and sustained viral response is only determined with labs after 1 year's time.  The patient has been offered a colonoscopy since he has never had a  colonoscopy but refuses a colonoscopy at this time.    Lucilla Lame, MD. Mar[facility] Regal   Note: This dictation was prepared with Dragon dictation along with smaller phrase technology. Any transcriptional errors that result from this process are unintentional.

## 2018-12-06 DIAGNOSIS — M5416 Radiculopathy, lumbar region: Secondary | ICD-10-CM | POA: Diagnosis not present

## 2018-12-06 DIAGNOSIS — Z79899 Other long term (current) drug therapy: Secondary | ICD-10-CM | POA: Diagnosis not present

## 2018-12-06 DIAGNOSIS — Z5181 Encounter for therapeutic drug level monitoring: Secondary | ICD-10-CM | POA: Diagnosis not present

## 2018-12-08 LAB — HEPATIC FUNCTION PANEL
ALT: 16 IU/L (ref 0–44)
AST: 16 IU/L (ref 0–40)
Albumin: 4.1 g/dL (ref 3.8–4.8)
Alkaline Phosphatase: 65 IU/L (ref 39–117)
Bilirubin Total: 0.5 mg/dL (ref 0.0–1.2)
Bilirubin, Direct: 0.14 mg/dL (ref 0.00–0.40)
Total Protein: 7.2 g/dL (ref 6.0–8.5)

## 2018-12-08 LAB — HCV RNA QUANT: Hepatitis C Quantitation: NOT DETECTED IU/mL

## 2018-12-11 ENCOUNTER — Telehealth: Payer: Self-pay

## 2018-12-11 NOTE — Telephone Encounter (Signed)
-----   Message from Lucilla Lame, MD sent at 12/11/2018  7:05 AM EDT ----- Let the patient know that his liver enzymes are back to normal and his hepatitis C virus was undetectable at this time.

## 2018-12-11 NOTE — Telephone Encounter (Signed)
Left vm with results

## 2018-12-12 DIAGNOSIS — M5416 Radiculopathy, lumbar region: Secondary | ICD-10-CM | POA: Diagnosis not present

## 2018-12-19 ENCOUNTER — Other Ambulatory Visit: Payer: Self-pay | Admitting: Infectious Diseases

## 2018-12-19 MED ORDER — CEPHALEXIN 500 MG PO CAPS: 500.0000 mg | ORAL_CAPSULE | Freq: Two times a day (BID) | ORAL | 3 refills | Status: DC

## 2018-12-22 ENCOUNTER — Telehealth: Payer: Self-pay | Admitting: Licensed Clinical Social Worker

## 2018-12-22 ENCOUNTER — Ambulatory Visit (INDEPENDENT_AMBULATORY_CARE_PROVIDER_SITE_OTHER): Payer: Medicaid Other | Admitting: Physician Assistant

## 2018-12-22 ENCOUNTER — Other Ambulatory Visit: Payer: Self-pay

## 2018-12-22 ENCOUNTER — Encounter: Payer: Self-pay | Admitting: Physician Assistant

## 2018-12-22 DIAGNOSIS — S80812A Abrasion, left lower leg, initial encounter: Secondary | ICD-10-CM

## 2018-12-22 NOTE — Progress Notes (Addendum)
[facility] NOTE  12/22/2018  History of Present Illness: Corey James is a 63 y.o. male known to our practice who has a history of necrotizing infection s/p I&D in February of 2020 with subsequent wound vac placement. He initially did well and developed significant cellulitis of his entire LLE in April of 2020. He did not require and subsequent debridements. Ultimately did well after following with ID and wound care outpatient.   He represents to the clinic this morning, he reports that about 10 days ago he "bumped" his left lower leg on something. Since that time he has had an abrasion to his anterior mid-shin. There has been no erythema and he has remained afebrile. He did notice some clear, thin non-odorous drainage from this which started yesterday. He has not taken anything for pain. No other acute issues. He continues to keep his previous left thigh incision clean and dry daily. No other complaints. He wanted to make sure there was no infection given his history. He was prescribed a course of Keflex buy ID on 07/28.   Past Medical History: Past Medical History:  Diagnosis Date  . Back pain   . Cellulitis   . Disc degeneration, lumbosacral   . Hepatitis C      Past Surgical History: Past Surgical History:  Procedure Laterality Date  . APPLICATION OF WOUND VAC Left 07/14/2018   Procedure: WOUND VAC CHANGE;  Surgeon: Olean Ree, MD;  Location: [facility] ORS;  Service: General;  Laterality: Left;  . INCISION AND DRAINAGE ABSCESS Left 07/11/2018   Procedure: INCISION AND DRAINAGE ABSCESS-LEFT THIGH;  Surgeon: [name], MD;  Location: [facility] ORS;  Service: General;  Laterality: Left;  . WOUND DEBRIDEMENT Left 07/14/2018   Procedure: POSSIBLE DEBRIDEMENT WOUND-LEFT THIGH;  Surgeon: Olean Ree, MD;  Location: [facility] ORS;  Service: General;  Laterality: Left;    Home Medications: Prior to Admission medications   Medication Sig Start Date End Date Taking?  Authorizing Provider  acetaminophen (TYLENOL) 500 MG tablet Take 1 tablet (500 mg total) by mouth every 6 (six) hours as needed for mild pain or fever. 07/15/18  Yes Gouru, Illene Silver, MD  cephALEXin (KEFLEX) 500 MG capsule Take 1 capsule (500 mg total) by mouth 2 (two) times daily. 12/19/18  Yes Tsosie Billing, MD  cyclobenzaprine (FLEXERIL) 10 MG tablet Take 10 mg by mouth as directed.  08/18/18  Yes [provider]  docusate sodium (COLACE) 100 MG capsule Take 100 mg by mouth 2 (two) times daily.   Yes [provider]  gabapentin (NEURONTIN) 300 MG capsule Take 300-600 mg by mouth See admin instructions. Take 1 capsule (300mg ) by mouth every morning and up to 2 capsules (600mg ) by mouth at bedtime   Yes [provider]  silver sulfADIAZINE (SILVADENE) 1 % cream Apply to affected area daily 10/09/18 10/09/19 Yes Pabon, Marjory Lies, MD    Allergies: Allergies  Allergen Reactions  . Penicillins Itching and Rash    Did it involve swelling of the face/tongue/throat, SOB, or low BP? Unknown Did it involve sudden or severe rash/hives, skin peeling, or any reaction on the inside of your mouth or nose? Yes Did you need to seek medical attention at a hospital or doctor's office? Unknown When did it last happen?Unknown If all above answers are "NO", may proceed with cephalosporin use.     Review of Systems: Review of Systems  Constitutional: Negative for chills and fever.  Skin:       +  Abrasion (Left Shin)  All other systems reviewed and are negative.   Physical Exam BP (!) 175/81   Pulse 85   Temp (!) 97.1 F (36.2 C) (Temporal)   Ht 5\' 9"  (1.753 m)   Wt (!) 335 lb (152 kg)   SpO2 94%   BMI 49.47 kg/m  CONSTITUTIONAL: Well appearing male, NAD HEENT:  Normocephalic, atraumatic, extraocular motion intact. RESPIRATORY: Normal respiratory effort without pathologic use of accessory muscles. NEUROLOGIC:  Motor and sensation is grossly normal.  Cranial nerves  are grossly intact. PSYCH:  Alert and oriented to person, place and time. Affect is normal. SKIN: Small 3 cm abrasion to the left anterior shin, no erythema, non-tender. It is draining clear serous fluid (consistent with lymphedema). No fluctuance, purulence, or crepitus. No evidence of abscess of necrotizing infection. Left medial thigh incision is well healed, does appear to have iintertrigo MUSCULOSKELETAL: 2+ pitting edema bilaterally to level of knee, venous stasis changes bilaterally    Left Anterior Shin Abrasion (12/22/2018):      Labs/Imaging: No pertinent labs/imaging to this visit  Assessment and Plan: This is a 63 y.o. male with abrasion to left anterior lower leg without evidence of infection or abscess.    - No evidence of infection, no indication for surgical intervention  - Continue ABx as per ID recommendations  - recommend dry dressing as needed  - discussed symptoms/symptoms of infection/abscess  - Continue to keep left thigh incision clean and dry  - Encouraged to RTC PRN or call with questions  Face-to-face time spent with the patient and care providers was 15 minutes, with more than 50% of the time spent counseling, educating, and coordinating care of the patient.    -- Edison Simon, PA-C [facility] 12/22/2018, 10:12 AM [phone] M-F: 7am - 4pm

## 2018-12-22 NOTE — Patient Instructions (Signed)
Keep a dressing over the area until the drainage stops.  Continue to clean the thigh wound daily as well.

## 2018-12-22 NOTE — Telephone Encounter (Signed)
Spoke with the patient he states that he went by his surgeon's office and was evaluated. Patient states that the surgeon said the area looked fine and no indication of infection.

## 2018-12-22 NOTE — Telephone Encounter (Signed)
Patient called stating that he hit his leg on something a few days ago and now it is red and draining. No pain or fever. Patient wants to know what he should do. He is still taking the antibiotics that Dr. Delaine Lame prescribed.

## 2018-12-29 DIAGNOSIS — M5416 Radiculopathy, lumbar region: Secondary | ICD-10-CM | POA: Diagnosis not present

## 2019-01-02 ENCOUNTER — Telehealth: Payer: Self-pay | Admitting: Emergency Medicine

## 2019-01-02 NOTE — Telephone Encounter (Signed)
Copied from [place]. Topic: Referral - Request for Referral >> Dec 29, 2018 10:12 AM Nils Flack wrote: Has patient seen PCP for this complaint? No. *If NO, is insurance requiring patient see PCP for this issue before PCP can refer them? Referral for which specialty: bariatrics  Preferred provider/office: [place] Reason for referral: by pass surgery

## 2019-01-02 NOTE — Telephone Encounter (Signed)
Would you like to see patient for virtual or in office appointment for these referrals

## 2019-01-02 NOTE — Telephone Encounter (Signed)
We've discussed this in the past - no need for visit.  Referral is placed.

## 2019-01-03 DIAGNOSIS — M5416 Radiculopathy, lumbar region: Secondary | ICD-10-CM | POA: Diagnosis not present

## 2019-01-10 ENCOUNTER — Other Ambulatory Visit: Payer: Self-pay | Admitting: Licensed Clinical Social Worker

## 2019-01-10 NOTE — Telephone Encounter (Signed)
error 

## 2019-02-01 DIAGNOSIS — Z6841 Body Mass Index (BMI) 40.0 and over, adult: Secondary | ICD-10-CM | POA: Diagnosis not present

## 2019-02-14 DIAGNOSIS — S46912A Strain of unspecified muscle, fascia and tendon at shoulder and upper arm level, left arm, initial encounter: Secondary | ICD-10-CM | POA: Diagnosis not present

## 2019-02-14 DIAGNOSIS — M25512 Pain in left shoulder: Secondary | ICD-10-CM | POA: Diagnosis not present

## 2019-02-19 DIAGNOSIS — S61327A Laceration with foreign body of left little finger with damage to nail, initial encounter: Secondary | ICD-10-CM | POA: Diagnosis not present

## 2019-02-22 DIAGNOSIS — S61451A Open bite of right hand, initial encounter: Secondary | ICD-10-CM | POA: Diagnosis not present

## 2019-02-22 DIAGNOSIS — S61316D Laceration without foreign body of right little finger with damage to nail, subsequent encounter: Secondary | ICD-10-CM | POA: Diagnosis not present

## 2019-02-24 DIAGNOSIS — M5416 Radiculopathy, lumbar region: Secondary | ICD-10-CM | POA: Diagnosis not present

## 2019-02-26 DIAGNOSIS — S61316D Laceration without foreign body of right little finger with damage to nail, subsequent encounter: Secondary | ICD-10-CM | POA: Diagnosis not present

## 2019-03-01 ENCOUNTER — Ambulatory Visit: Payer: Self-pay | Admitting: *Deleted

## 2019-03-01 ENCOUNTER — Encounter: Payer: Self-pay | Admitting: *Deleted

## 2019-03-01 NOTE — Progress Notes (Signed)
This encounter was created in error - please disregard.

## 2019-03-01 NOTE — Chronic Care Management (AMB) (Signed)
  Care Management   Social Work Note  03/01/2019 Name: Rockwell Zeldin MRN: [id number] DOB: 08-Jun-1955  Deandre Ballin is a 63 y.o. year old male who sees [name], FNP for primary care.   This CCM social worker received a call from patient requesting a return call. Phone call returned to patient, however he was not reached. HIPPA complaint voicemail left requesting a return call.    Outpatient Encounter Medications as of 03/01/2019  Medication Sig  . acetaminophen (TYLENOL) 500 MG tablet Take 1 tablet (500 mg total) by mouth every 6 (six) hours as needed for mild pain or fever.  . cephALEXin (KEFLEX) 500 MG capsule Take 1 capsule (500 mg total) by mouth 2 (two) times daily.  . cyclobenzaprine (FLEXERIL) 10 MG tablet Take 10 mg by mouth as directed.   . docusate sodium (COLACE) 100 MG capsule Take 100 mg by mouth 2 (two) times daily.  [name] gabapentin (NEURONTIN) 300 MG capsule Take 300-600 mg by mouth See admin instructions. Take 1 capsule (300mg ) by mouth every morning and up to 2 capsules (600mg ) by mouth at bedtime  . silver sulfADIAZINE (SILVADENE) 1 % cream Apply to affected area daily   No facility-administered encounter medications on file as of 03/01/2019.     Goals Addressed   None     Follow Up Plan: Client will return call once message is received  Elliot Gurney, [place]/[facility] Care Management [phone]

## 2019-03-02 DIAGNOSIS — Z6841 Body Mass Index (BMI) 40.0 and over, adult: Secondary | ICD-10-CM | POA: Diagnosis not present

## 2019-03-21 DIAGNOSIS — M5416 Radiculopathy, lumbar region: Secondary | ICD-10-CM | POA: Diagnosis not present

## 2019-03-27 DIAGNOSIS — M5416 Radiculopathy, lumbar region: Secondary | ICD-10-CM | POA: Diagnosis not present

## 2019-04-02 DIAGNOSIS — Z6841 Body Mass Index (BMI) 40.0 and over, adult: Secondary | ICD-10-CM | POA: Diagnosis not present

## 2019-04-08 ENCOUNTER — Emergency Department
Admission: EM | Admit: 2019-04-08 | Discharge: 2019-04-08 | Disposition: A | Discharge status: Home / Self Care | Payer: Medicaid Other | Attending: Emergency Medicine | Admitting: Emergency Medicine

## 2019-04-08 ENCOUNTER — Other Ambulatory Visit: Payer: Self-pay

## 2019-04-08 DIAGNOSIS — L539 Erythematous condition, unspecified: Secondary | ICD-10-CM | POA: Insufficient documentation

## 2019-04-08 DIAGNOSIS — L03116 Cellulitis of left lower limb: Secondary | ICD-10-CM | POA: Diagnosis not present

## 2019-04-08 DIAGNOSIS — F17210 Nicotine dependence, cigarettes, uncomplicated: Secondary | ICD-10-CM | POA: Insufficient documentation

## 2019-04-08 DIAGNOSIS — M79605 Pain in left leg: Secondary | ICD-10-CM | POA: Diagnosis present

## 2019-04-08 LAB — URINALYSIS, COMPLETE (UACMP) WITH MICROSCOPIC
Bacteria, UA: NONE SEEN
Bilirubin Urine: NEGATIVE
Glucose, UA: NEGATIVE mg/dL
Hgb urine dipstick: NEGATIVE
Ketones, ur: NEGATIVE mg/dL
Leukocytes,Ua: NEGATIVE
Nitrite: NEGATIVE
Protein, ur: NEGATIVE mg/dL
Specific Gravity, Urine: 1.026 (ref 1.005–1.030)
WBC, UA: NONE SEEN WBC/hpf (ref 0–5)
pH: 6 (ref 5.0–8.0)

## 2019-04-08 LAB — COMPREHENSIVE METABOLIC PANEL
ALT: 16 U/L (ref 0–44)
AST: 15 U/L (ref 15–41)
Albumin: 3.7 g/dL (ref 3.5–5.0)
Alkaline Phosphatase: 57 U/L (ref 38–126)
Anion gap: 11 (ref 5–15)
BUN: 20 mg/dL (ref 8–23)
CO2: 25 mmol/L (ref 22–32)
Calcium: 9.1 mg/dL (ref 8.9–10.3)
Chloride: 102 mmol/L (ref 98–111)
Creatinine, Ser: 1.03 mg/dL (ref 0.61–1.24)
GFR calc Af Amer: >60 mL/min (ref >60)
GFR calc non Af Amer: >60 mL/min (ref >60)
Glucose, Bld: 139 mg/dL — ABNORMAL HIGH (ref 70–99)
Potassium: 4.2 mmol/L (ref 3.5–5.1)
Sodium: 138 mmol/L (ref 135–145)
Total Bilirubin: 0.9 mg/dL (ref 0.3–1.2)
Total Protein: 7.2 g/dL (ref 6.5–8.1)

## 2019-04-08 LAB — CBC WITH DIFFERENTIAL/PLATELET
Abs Immature Granulocytes: 0.06 10*3/uL (ref 0.00–0.07)
Basophils Absolute: 0 10*3/uL (ref 0.0–0.1)
Basophils Relative: 0 %
Eosinophils Absolute: 0.1 10*3/uL (ref 0.0–0.5)
Eosinophils Relative: 1 %
HCT: 46.6 % (ref 39.0–52.0)
Hemoglobin: 15.5 g/dL (ref 13.0–17.0)
Immature Granulocytes: 0 %
Lymphocytes Relative: 6 %
Lymphs Abs: 0.9 10*3/uL (ref 0.7–4.0)
MCH: 31.2 pg (ref 26.0–34.0)
MCHC: 33.3 g/dL (ref 30.0–36.0)
MCV: 93.8 fL (ref 80.0–100.0)
Monocytes Absolute: 0.4 10*3/uL (ref 0.1–1.0)
Monocytes Relative: 3 %
Neutro Abs: 14 10*3/uL — ABNORMAL HIGH (ref 1.7–7.7)
Neutrophils Relative %: 90 %
Platelets: 151 10*3/uL (ref 150–400)
RBC: 4.97 MIL/uL (ref 4.22–5.81)
RDW: 12.4 % (ref 11.5–15.5)
WBC: 15.6 10*3/uL — ABNORMAL HIGH (ref 4.0–10.5)
nRBC: 0 % (ref 0.0–0.2)

## 2019-04-08 LAB — LACTIC ACID, PLASMA
Lactic Acid, Venous: 1.3 mmol/L (ref 0.5–1.9)
Lactic Acid, Venous: 0.9 mmol/L (ref 0.5–1.9)

## 2019-04-08 MED ORDER — CLINDAMYCIN PHOSPHATE 600 MG/50ML IV SOLN
600.0000 mg | Freq: Once | INTRAVENOUS | Status: AC
  Administered 2019-04-08: 600 mg via INTRAVENOUS
  Filled 2019-04-08 (×2): qty 50

## 2019-04-08 MED ORDER — CLINDAMYCIN HCL 300 MG PO CAPS: 300.0000 mg | ORAL_CAPSULE | Freq: Three times a day (TID) | ORAL | 0 refills | Status: AC

## 2019-04-08 NOTE — ED Provider Notes (Signed)
[facility] Emergency Department Provider Note   ____________________________________________    I have reviewed the triage vital signs and the nursing notes.   HISTORY  Chief Complaint Cellulitis     HPI Corey James is a 63 y.o. male with a history of left leg cellulitis who is on Keflex twice daily daily reports over the last day he has noticed worsening pain in his left leg and now has redness extending up his thigh.  He has apparently had this before and does see Dr. Steva Ready of infectious disease.  Reports he has been taking his Keflex as prescribed also took some clindamycin that he had today.  Denies fevers.  No nausea or vomiting.  Burning pain in his leg  Past Medical History:  Diagnosis Date  . Back pain   . Cellulitis   . Disc degeneration, lumbosacral   . Hepatitis C     Patient Active Problem List   Diagnosis Date Noted  . Cellulitis 10/03/2018  . Sepsis ([place]) 09/05/2018  . Chronic bilateral low back pain with right-sided sciatica 08/31/2018  . Class 3 severe obesity due to excess calories with serious comorbidity and body mass index (BMI) of 50.0 to 59.9 in adult ([place]) 08/31/2018  . Nocturia 08/31/2018  . Necrotizing soft tissue infection   . Abscess of left lower extremity   . Thigh abscess 07/06/2018  . Arthritis of hip 06/09/2018  . DDD (degenerative disc disease), lumbar 06/09/2018  . Morbid obesity with BMI of 45.0-49.9, adult ([place]) 06/09/2018    Past Surgical History:  Procedure Laterality Date  . APPLICATION OF WOUND VAC Left 07/14/2018   Procedure: WOUND VAC CHANGE;  Surgeon: Olean Ree, MD;  Location: [facility] ORS;  Service: General;  Laterality: Left;  . INCISION AND DRAINAGE ABSCESS Left 07/11/2018   Procedure: INCISION AND DRAINAGE ABSCESS-LEFT THIGH;  Surgeon: [name], MD;  Location: [facility] ORS;  Service: General;  Laterality: Left;  . WOUND DEBRIDEMENT Left 07/14/2018   Procedure: POSSIBLE DEBRIDEMENT  WOUND-LEFT THIGH;  Surgeon: Olean Ree, MD;  Location: [facility] ORS;  Service: General;  Laterality: Left;    Prior to Admission medications   Medication Sig Start Date End Date Taking? Authorizing Provider  acetaminophen (TYLENOL) 500 MG tablet Take 1 tablet (500 mg total) by mouth every 6 (six) hours as needed for mild pain or fever. 07/15/18   Gouru, Illene Silver, MD  cephALEXin (KEFLEX) 500 MG capsule Take 1 capsule (500 mg total) by mouth 2 (two) times daily. 12/19/18   Tsosie Billing, MD  clindamycin (CLEOCIN) 300 MG capsule Take 1 capsule (300 mg total) by mouth 3 (three) times daily for 10 days. 04/08/19 04/18/19  Lavonia Drafts, MD  cyclobenzaprine (FLEXERIL) 10 MG tablet Take 10 mg by mouth as directed.  08/18/18   [provider]  docusate sodium (COLACE) 100 MG capsule Take 100 mg by mouth 2 (two) times daily.    [provider]  gabapentin (NEURONTIN) 300 MG capsule Take 300-600 mg by mouth See admin instructions. Take 1 capsule (300mg ) by mouth every morning and up to 2 capsules (600mg ) by mouth at bedtime    [provider]  silver sulfADIAZINE (SILVADENE) 1 % cream Apply to affected area daily 10/09/18 10/09/19  [name], MD     Allergies Penicillins  Family History  Problem Relation Age of Onset  . Dementia Mother   . Alzheimer's disease Mother   . CVA Father   . Cancer - Colon Father   .  Heart attack Brother   . Hypertension Brother     Social History Social History   Tobacco Use  . Smoking status: Current Every Day Smoker    Packs/day: 1.00    Years: 40.00    Pack years: 40.00    Types: Cigarettes    Start date: 09/06/2018  . Smokeless tobacco: Current User  . Tobacco comment: Wellbutrin  Substance Use Topics  . Alcohol use: Yes    Comment: 1/5 of vodka per month   . Drug use: Not Currently    Types: Marijuana    Review of Systems  Constitutional: No fever/chills Eyes: No visual changes.  ENT: No sore throat.  Cardiovascular: Denies chest pain. Respiratory: Denies shortness of breath. Gastrointestinal: No abdominal pain.   Genitourinary: Negative for dysuria. Musculoskeletal: As above Skin: Negative for rash. Neurological: Negative for headaches   ____________________________________________   PHYSICAL EXAM:  VITAL SIGNS: ED Triage Vitals  Enc Vitals Group     BP 04/08/19 1739 (!) 167/83     Pulse Rate 04/08/19 1739 (!) 118     Resp --      Temp 04/08/19 1739 99.2 F (37.3 C)     Temp Source 04/08/19 1739 Oral     SpO2 04/08/19 1739 97 %     Weight 04/08/19 1740 (!) 147.4 kg (325 lb)     Height 04/08/19 1740 1.753 m (5\' 9" )     Head Circumference --      Peak Flow --      Pain Score 04/08/19 1745 6     Pain Loc --      Pain Edu? --      Excl. in [place]? --     Constitutional: Alert and oriented.  Nose: No congestion/rhinnorhea. Mouth/Throat: Mucous membranes are moist.    Cardiovascular: Normal rate, regular rhythm. Good peripheral circulation. Respiratory: Normal respiratory effort.  No retractions.  Gastrointestinal: Soft and nontender. No distention.    Musculoskeletal: Left leg with erythema to the lower leg extending up the medial thigh to the level of the groin Neurologic:  Normal speech and language. No gross focal neurologic deficits are appreciated.  Skin:  Skin is warm, dry.  As above Psychiatric: Mood and affect are normal. Speech and behavior are normal.  ____________________________________________   LABS (all labs ordered are listed, but only abnormal results are displayed)  Labs Reviewed  COMPREHENSIVE METABOLIC PANEL - Abnormal; Notable for the following components:      Result Value   Glucose, Bld 139 (*)    All other components within normal limits  CBC WITH DIFFERENTIAL/PLATELET - Abnormal; Notable for the following components:   WBC 15.6 (*)    Neutro Abs 14.0 (*)    All other components within normal limits  URINALYSIS, COMPLETE (UACMP) WITH  MICROSCOPIC - Abnormal; Notable for the following components:   Color, Urine YELLOW (*)    APPearance CLEAR (*)    All other components within normal limits  LACTIC ACID, PLASMA  LACTIC ACID, PLASMA   ____________________________________________  EKG  None ____________________________________________  RADIOLOGY None ____________________________________________   PROCEDURES  Procedure(s) performed: No  Procedures   Critical Care performed: No ____________________________________________   INITIAL IMPRESSION / ASSESSMENT AND PLAN / ED COURSE  Pertinent labs & imaging results that were available during my care of the patient were reviewed by me and considered in my medical decision making (see chart for details).  Patient presents with left leg cellulitis in the setting of taking chronic Keflex.  White blood cell count is 15.6, tachycardic, lactic is normal.  I recommended strongly to the patient that he be admitted to the hospital however he says that he cannot because he has a dog that he needs to take care of and he patently refuses to stay in the hospital.  He understands that this infection could progress to sepsis which can cause death and significant morbidity.  He states that he will follow-up closely with his infectious disease doctor I did convince him to stay for a dose of IV antibiotics .  Patient assures me that he will return tomorrow if he is not feeling better    ____________________________________________   FINAL CLINICAL IMPRESSION(S) / ED DIAGNOSES  Final diagnoses:  Cellulitis of left lower extremity        Note:  This document was prepared using Dragon voice recognition software and may include unintentional dictation errors.   Lavonia Drafts, MD 04/08/19 2222

## 2019-04-08 NOTE — ED Notes (Signed)
Pt refusing monitors and bp cuff. States he does not need it since his only problem is his legs. Pt encouraged to keep monitors on.

## 2019-04-08 NOTE — ED Notes (Signed)
Pt states he wants to leave 

## 2019-04-08 NOTE — ED Notes (Signed)
Pt refusing to leave monitors on for dc vitals

## 2019-04-08 NOTE — ED Notes (Signed)
Pt states he needs to get home and take care of his dog and wants to speak to his doctor tomorrow. Pt states he does not want to stay in hospital.

## 2019-04-08 NOTE — ED Notes (Signed)
Pt refusing to keep monitors on. States he wants this RN to give him his meds and he wants to go home. States he wants appointment made with his doctor. Pt encouraged to keep monitors on and stay for treatment.

## 2019-04-08 NOTE — ED Triage Notes (Signed)
Pt states today he noticed L leg began oozing clear liquid. Hx cellulitis. Pt denies leg swelling. Took keflex (every day twice a day) and clindamycin (had some extra laying around so took 2). A&O, in wheelchair.

## 2019-04-10 ENCOUNTER — Other Ambulatory Visit: Payer: Self-pay

## 2019-04-10 ENCOUNTER — Encounter: Payer: Self-pay | Admitting: Infectious Diseases

## 2019-04-10 ENCOUNTER — Ambulatory Visit: Payer: Medicaid Other | Attending: Infectious Diseases | Admitting: Infectious Diseases

## 2019-04-10 VITALS — BP 124/83 | HR 107 | Temp 97.9°F | Resp 16 | Ht 69.0 in | Wt 320.0 lb

## 2019-04-10 DIAGNOSIS — G8929 Other chronic pain: Secondary | ICD-10-CM

## 2019-04-10 DIAGNOSIS — L03116 Cellulitis of left lower limb: Secondary | ICD-10-CM | POA: Diagnosis not present

## 2019-04-10 DIAGNOSIS — Z872 Personal history of diseases of the skin and subcutaneous tissue: Secondary | ICD-10-CM

## 2019-04-10 DIAGNOSIS — Z880 Allergy status to penicillin: Secondary | ICD-10-CM

## 2019-04-10 DIAGNOSIS — Z6841 Body Mass Index (BMI) 40.0 and over, adult: Secondary | ICD-10-CM

## 2019-04-10 DIAGNOSIS — F17211 Nicotine dependence, cigarettes, in remission: Secondary | ICD-10-CM

## 2019-04-10 DIAGNOSIS — M549 Dorsalgia, unspecified: Secondary | ICD-10-CM

## 2019-04-10 DIAGNOSIS — E669 Obesity, unspecified: Secondary | ICD-10-CM

## 2019-04-10 DIAGNOSIS — Z792 Long term (current) use of antibiotics: Secondary | ICD-10-CM

## 2019-04-10 MED ORDER — CEPHALEXIN 500 MG PO CAPS: 500.0000 mg | ORAL_CAPSULE | Freq: Two times a day (BID) | ORAL | 3 refills | Status: DC

## 2019-04-10 MED ORDER — SACCHAROMYCES BOULARDII 250 MG PO CAPS: 250.0000 mg | ORAL_CAPSULE | Freq: Every day | ORAL | 3 refills | Status: DC

## 2019-04-10 NOTE — Patient Instructions (Signed)
You are here for cellulitis of the left leg following a cut on the skin- you were given clindamycin in the ED. Today you are feeling much better. Would recommend after you complete clindamycin restart suppressive keflex 500mg  twice a day.o not take both together now- take a probiotic or yogurt every day. Use compression bandage or stocking on the leg. Keep the leg elevated while sitting .follow up as needed

## 2019-04-10 NOTE — Progress Notes (Signed)
NAME: Corey James  DOB: [birth year]  MRN: [id number]  Date/Time: 04/10/2019 11:00 AM  ? Corey James is a 63 y.o. male with a history of cellulitis left leg, bilateral leg edema, chronic back pain is here because of recent left leg inflammation. Patient since May 2020 has been on suppressive therapy with Keflex and for recurrent cellulitis in the past.  A few days ago he sustained a cut to his left leg when he was working  and noted that the leg was getting swollen with pain and redness.  The redness was extending up to his thigh.  He did not have any fever.  He went to the ED on 04/08/2019.  His white count was 15.6 but his lactate was normal there was some tachycardia he was asked to get admitted but patient refused and left the ED on clindamycin. He is here to see me today. He is doing much better he says He has no fever or chills He has been taking his Keflex regularly until he sustained a cut. He does not wear compression stockings. Patient has a complicated left leg infection since February 2020.  He was initially hospitalized at [facility] between February 13 until February 22 and had a necrotizing soft tissue infection of the left groin underwent excisional debridement.  At that time the culture was staph aureus and Streptococcus E.  He received intravenous ceftriaxone and clindamycin while in the hospital and sent home with wound VAC and p.o. Omnicef and Flagyl for 10 days. He was readmitted to the hospital on 09/04/2018 with cellulitis of the left leg and left thigh.  During that hospitalization he had a temperature of 101.5 with increasing WBC.  He was started on vancomycin and ceftriaxone.  The [place] culture from the small groin wound has Streptococcus C.  He was discharged on Keflex and clindamycin. He then followed up with wound clinic and had another admission to the hospital on 10/03/2018 because of redness of the leg.  During that hospitalization he was treated with IV antibiotics and then  he was discharged on p.o. medication.  I saw him both as inpatient and outpatient and in May 2020 decided to put him on long-term suppressive therapy with Keflex.  He is not had any hospitalizations since then.  He did go to the ED on 15th with left leg pain. Patient says he has quit smoking. He has been considering bariatric surgery to reduce weight. He received steroid injections to his back and his last was in August. Past Medical History:  Diagnosis Date  . Back pain   . Cellulitis   . Disc degeneration, lumbosacral   . Hepatitis C     Past Surgical History:  Procedure Laterality Date  . APPLICATION OF WOUND VAC Left 07/14/2018   Procedure: WOUND VAC CHANGE;  Surgeon: Olean Ree, MD;  Location: [facility] ORS;  Service: General;  Laterality: Left;  . INCISION AND DRAINAGE ABSCESS Left 07/11/2018   Procedure: INCISION AND DRAINAGE ABSCESS-LEFT THIGH;  Surgeon: [name], MD;  Location: [facility] ORS;  Service: General;  Laterality: Left;  . WOUND DEBRIDEMENT Left 07/14/2018   Procedure: POSSIBLE DEBRIDEMENT WOUND-LEFT THIGH;  Surgeon: Olean Ree, MD;  Location: [facility] ORS;  Service: General;  Laterality: Left;    Social History   Socioeconomic History  . Marital status: Widowed    Spouse name: Not on file  . Number of children: Not on file  . Years of education: 23  . Highest education level: High school graduate  Occupational History  . Occupation: Risk manager: [employer]  Social Needs  . Financial resource strain: Not hard at all  . Food insecurity    Worry: Never true    Inability: Never true  . Transportation needs    Medical: No    Non-medical: No  Tobacco Use  . Smoking status: Current Every Day Smoker    Packs/day: 1.00    Years: 40.00    Pack years: 40.00    Types: Cigarettes    Start date: 09/06/2018  . Smokeless tobacco: Current User  . Tobacco comment: Wellbutrin  Substance and Sexual Activity  . Alcohol use: Yes    Comment: 1/5 of  vodka per month   . Drug use: Not Currently    Types: Marijuana  . Sexual activity: Yes    Partners: Female    Birth control/protection: None  Lifestyle  . Physical activity    Days per week: 2 days    Minutes per session: 10 min  . Stress: Not at all  Relationships  . Social connections    Talks on phone: More than three times a week    Gets together: More than three times a week    Attends religious service: Never    Active member of club or organization: No    Attends meetings of clubs or organizations: Never    Relationship status: Widowed  . Intimate partner violence    Fear of current or ex partner: No    Emotionally abused: No    Physically abused: No    Forced sexual activity: No  Other Topics Concern  . Not on file  Social History Narrative  . Not on file    Family History  Problem Relation Age of Onset  . Dementia Mother   . Alzheimer's disease Mother   . CVA Father   . Cancer - Colon Father   . Heart attack Brother   . Hypertension Brother    Allergies  Allergen Reactions  . Penicillins Itching and Rash    Did it involve swelling of the face/tongue/throat, SOB, or low BP? Unknown Did it involve sudden or severe rash/hives, skin peeling, or any reaction on the inside of your mouth or nose? Yes Did you need to seek medical attention at a hospital or doctor's office? Unknown When did it last happen?Unknown If all above answers are "NO", may proceed with cephalosporin use.     ? Current Outpatient Medications  Medication Sig Dispense Refill  . cephALEXin (KEFLEX) 500 MG capsule Take 1 capsule (500 mg total) by mouth 2 (two) times daily. 60 capsule 3  . clindamycin (CLEOCIN) 300 MG capsule Take 1 capsule (300 mg total) by mouth 3 (three) times daily for 10 days. 30 capsule 0  . docusate sodium (COLACE) 100 MG capsule Take 100 mg by mouth 2 (two) times daily.    . naproxen sodium (ALEVE) 220 MG tablet Take 220 mg by mouth.    .  oxyCODONE-acetaminophen (PERCOCET/ROXICET) 5-325 MG tablet Take by mouth every 4 (four) hours as needed for severe pain.    . silver sulfADIAZINE (SILVADENE) 1 % cream Apply to affected area daily 50 g 1   No current facility-administered medications for this visit.      Abtx:  Anti-infectives (From admission, onward)   None      REVIEW OF SYSTEMS:  Const: negative fever, negative chills, negative weight loss Eyes: negative diplopia or visual changes, negative eye pain  ENT: negative coryza, negative sore throat Resp: negative cough, hemoptysis, dyspnea Cards: negative for chest pain, palpitations, lower extremity edema GU: negative for frequency, dysuria and hematuria GI: Negative for abdominal pain, diarrhea, bleeding, constipation Skin: negative for rash and pruritus Heme: negative for easy bruising and gum/nose bleeding MS: negative for myalgias, arthralgias, back pain and muscle weakness Neurolo:negative for headaches, dizziness, vertigo, memory problems  Psych: negative for feelings of anxiety, depression  Endocrine: negative for thyroid, diabetes Allergy/Immunology- negative for any medication or food allergies ? Pertinent Positives include : Objective:  VITALS:  BP 124/83 (BP Location: Left Arm, Patient Position: Sitting, Cuff Size: Large)   Pulse (!) 107   Temp 97.9 F (36.6 C) (Oral)   Resp 16   Ht 5\' 9"  (1.753 m)   Wt (!) 320 lb (145.2 kg)   SpO2 95%   BMI 47.26 kg/m  PHYSICAL EXAM:  General: Alert, cooperative, no distress, BMI of more than 35 Head: Normocephalic, without obvious abnormality, atraumatic. Eyes: Conjunctivae clear, anicteric sclerae. Pupils are equal Not examined due to mask Neck: Supple, symmetrical, no adenopathy, thyroid: non tender no carotid bruit and no JVD. Back: No CVA tenderness. Lungs: Clear to auscultation bilaterally. No Wheezing or Rhonchi. No rales. Heart: Regular rate and rhythm, no murmur, rub or gallop. Abdomen: Soft,  non-tender,not distended. Bowel sounds normal. No masses Extremities:     Left leg is swollen, erythema is much improved No blisters or wounds Skin: No rashes or lesions. Or bruising Lymph: Cervical, supraclavicular normal. Neurologic: Grossly non-focal Pertinent Labs Lab Results CBC    Component Value Date/Time   WBC 15.6 (H) 04/08/2019 1749   RBC 4.97 04/08/2019 1749   HGB 15.5 04/08/2019 1749   HCT 46.6 04/08/2019 1749   PLT 151 04/08/2019 1749   MCV 93.8 04/08/2019 1749   MCH 31.2 04/08/2019 1749   MCHC 33.3 04/08/2019 1749   RDW 12.4 04/08/2019 1749   LYMPHSABS 0.9 04/08/2019 1749   MONOABS 0.4 04/08/2019 1749   EOSABS 0.1 04/08/2019 1749   BASOSABS 0.0 04/08/2019 1749    CMP Latest Ref Rng & Units 04/08/2019 12/05/2018 10/03/2018  Glucose 70 - 99 mg/dL 139(H) - 138(H)  BUN 8 - 23 mg/dL 20 - 16  Creatinine 0.61 - 1.24 mg/dL 1.03 - 1.13  Sodium 135 - 145 mmol/L 138 - 138  Potassium 3.5 - 5.1 mmol/L 4.2 - 3.9  Chloride 98 - 111 mmol/L 102 - 101  CO2 22 - 32 mmol/L 25 - 27  Calcium 8.9 - 10.3 mg/dL 9.1 - 9.1  Total Protein 6.5 - 8.1 g/dL 7.2 7.2 8.9(H)  Total Bilirubin 0.3 - 1.2 mg/dL 0.9 0.5 0.4  Alkaline Phos 38 - 126 U/L 57 65 88  AST 15 - 41 U/L 15 16 14(L)  ALT 0 - 44 U/L 16 16 18       Microbiology: No results found for this or any previous visit (from the past 240 hour(s)).   ? Impression/Recommendation ? 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  Has been on p.o. Keflex maintenance therapy since May 2020 which prevented him from getting hospitalized.  Prior to that he has been in the hospital at least 3 different times.  He  is much improved since he has been on suppressive Keflex therapy.  The recent setback was due to a cut that he sustained to his leg. He is currently on clindamycin 3 times a day which she will finish this week.  Then he  will restart Keflex 500 mg p.o. twice daily along with probiotic. Asked  patient to wear compression stockings.  But he is finding it difficult to put one on.  So today I did a compression wrap and told him to use that instead of stocking. Asked him to keep the leg elevated while sitting at home.  The previous groin area wound has healed completely There is no intertrigo.  Obesity with BMI > 50  patient is considering bariatric surgery ? ____Discussed the management with the patient in great detail. We will follow as needed   note:  This document was prepared using Dragon voice recognition software and may include unintentional dictation errors.

## 2019-04-23 DIAGNOSIS — Z6841 Body Mass Index (BMI) 40.0 and over, adult: Secondary | ICD-10-CM | POA: Diagnosis not present

## 2019-05-04 ENCOUNTER — Ambulatory Visit (INDEPENDENT_AMBULATORY_CARE_PROVIDER_SITE_OTHER): Payer: Medicaid Other | Admitting: Physician Assistant

## 2019-05-04 ENCOUNTER — Other Ambulatory Visit: Payer: Self-pay

## 2019-05-04 ENCOUNTER — Encounter: Payer: Self-pay | Admitting: Physician Assistant

## 2019-05-04 VITALS — BP 175/90 | HR 79 | Temp 97.6°F | Resp 14 | Ht 69.0 in | Wt 327.8 lb

## 2019-05-04 DIAGNOSIS — B372 Candidiasis of skin and nail: Secondary | ICD-10-CM | POA: Diagnosis not present

## 2019-05-04 MED ORDER — NYSTATIN-TRIAMCINOLONE 100000-0.1 UNIT/GM-% EX OINT: 1.0000 "application " | TOPICAL_OINTMENT | Freq: Two times a day (BID) | CUTANEOUS | 0 refills | Status: DC

## 2019-05-04 NOTE — Patient Instructions (Signed)
Please pick up your medication at the pharmacy.   Please wash the area with soap and water. Apply the ointment twice daily and place a dry dressing over the area.   Please keep the area dry.

## 2019-05-04 NOTE — Progress Notes (Signed)
[facility] SURGICAL CLINIC NOTE  05/04/2019  History of Present Illness: Corey James is a 63 y.o. male known to our service with a history of chronic LLE. He is followed by ID for this and is on 500 mg Keflex BID as prophylaxis. Last ED visit was 11/15 for LLE cellulitis which did well on Clindamycin.   Today, he presents to our office with complaints of redness to the left groin. He notes that this intermittently will get red and irritated. No fever, chills, or drainage. His LLE erythema from a month ago has resolved. He is wearing his compression stockings and taking his prophylactic ABx. He does not that he typically does not dry the area of concern in his left groin well. Otherwise no complaints.    Past Medical History: Past Medical History:  Diagnosis Date  . Back pain   . Cellulitis   . Disc degeneration, lumbosacral   . Hepatitis C      Past Surgical History: Past Surgical History:  Procedure Laterality Date  . APPLICATION OF WOUND VAC Left 07/14/2018   Procedure: WOUND VAC CHANGE;  Surgeon: Olean Ree, MD;  Location: [facility] ORS;  Service: General;  Laterality: Left;  . INCISION AND DRAINAGE ABSCESS Left 07/11/2018   Procedure: INCISION AND DRAINAGE ABSCESS-LEFT THIGH;  Surgeon: [name], MD;  Location: [facility] ORS;  Service: General;  Laterality: Left;  . WOUND DEBRIDEMENT Left 07/14/2018   Procedure: POSSIBLE DEBRIDEMENT WOUND-LEFT THIGH;  Surgeon: Olean Ree, MD;  Location: [facility] ORS;  Service: General;  Laterality: Left;    Home Medications: Prior to Admission medications   Medication Sig Start Date End Date Taking? Authorizing Provider  cephALEXin (KEFLEX) 500 MG capsule Take 1 capsule (500 mg total) by mouth 2 (two) times daily. 04/10/19   Tsosie Billing, MD  docusate sodium (COLACE) 100 MG capsule Take 100 mg by mouth 2 (two) times daily.    [provider]  naproxen sodium (ALEVE) 220 MG tablet Take 220 mg by mouth.     [provider]  oxyCODONE-acetaminophen (PERCOCET/ROXICET) 5-325 MG tablet Take by mouth every 4 (four) hours as needed for severe pain.    [provider]  saccharomyces boulardii (FLORASTOR) 250 MG capsule Take 1 capsule (250 mg total) by mouth daily. 04/10/19   Tsosie Billing, MD  silver sulfADIAZINE (SILVADENE) 1 % cream Apply to affected area daily 10/09/18 10/09/19  [name], MD    Allergies: Allergies  Allergen Reactions  . Penicillins Itching and Rash    Did it involve swelling of the face/tongue/throat, SOB, or low BP? Unknown Did it involve sudden or severe rash/hives, skin peeling, or any reaction on the inside of your mouth or nose? Yes Did you need to seek medical attention at a hospital or doctor's office? Unknown When did it last happen?Unknown If all above answers are "NO", may proceed with cephalosporin use.     Review of Systems: Review of Systems  Constitutional: Negative for chills and fever.  Respiratory: Negative for cough and shortness of breath.   Cardiovascular: Negative for chest pain and palpitations.  Gastrointestinal: Negative for diarrhea, nausea and vomiting.  Musculoskeletal: Negative for joint pain and myalgias.  Skin: Positive for rash. Negative for itching.  All other systems reviewed and are negative.   Physical Exam There were no vitals taken for this visit.  Physical Exam Vitals and nursing note reviewed. Exam conducted with a chaperone present.  Constitutional:      General: He is not  in acute distress.    Appearance: Normal appearance. He is obese. He is not ill-appearing.  HENT:     Head: Normocephalic and atraumatic.  Eyes:     General: No scleral icterus.    Conjunctiva/sclera: Conjunctivae normal.  Pulmonary:     Effort: Pulmonary effort is normal. No respiratory distress.  Genitourinary:    Comments: Deferred Musculoskeletal:        General: No swelling or tenderness. Normal range of  motion.     Right lower leg: Edema present.     Left lower leg: Edema present.  Skin:    General: Skin is warm and dry.       Neurological:     General: No focal deficit present.     Mental Status: He is alert and oriented to person, place, and time.  Psychiatric:        Mood and Affect: Mood normal.        Behavior: Behavior normal.     Labs/Imaging: No pertinent imaging studies   Assessment and Plan: This is a 63 y.o. male with what appears to be candidal intertrigo to the left groin   - Discussed local skin care including keeping the area clean, dry, free of moister +/- placing gauze between skin folds.   - Will give Nystatin cream; if does not resolve can try PO antifungals  - Continue prophylactic ABx as prescribed by ID  - Continue compression stocking for venous edema  - return precautions discussed  - follow up prn; call with questions/concerns  Face-to-face time spent with the patient and care providers was 10 minutes, with more than 50% of the time spent counseling, educating, and coordinating care of the patient.    -- Edison Simon, PA-C [facility] 05/04/2019, 9:25 AM [phone] M-F: 7am - 4pm

## 2019-05-07 ENCOUNTER — Other Ambulatory Visit: Payer: Self-pay | Admitting: Physician Assistant

## 2019-05-07 ENCOUNTER — Other Ambulatory Visit: Payer: Self-pay

## 2019-05-07 ENCOUNTER — Telehealth: Payer: Self-pay | Admitting: *Deleted

## 2019-05-07 MED ORDER — NYSTATIN 100000 UNIT/GM EX CREA: 1.0000 "application " | TOPICAL_CREAM | Freq: Two times a day (BID) | CUTANEOUS | 0 refills | Status: AC

## 2019-05-07 MED ORDER — NYSTATIN 100000 UNIT/GM EX POWD: Freq: Four times a day (QID) | CUTANEOUS | 0 refills | Status: DC

## 2019-05-07 NOTE — Telephone Encounter (Signed)
Patient called and stated that he wants you to call her back in regards to the ointment nystatin, he stated that insurance is not wanting to cover it and he wants to know what he needs to do. Please call and advise

## 2019-05-07 NOTE — Telephone Encounter (Signed)
Spoke with Edison Simon PA-C about prescription. He has changed his prescription to plain Nystatin cream. Patient is aware of new prescription.

## 2019-05-16 DIAGNOSIS — M25551 Pain in right hip: Secondary | ICD-10-CM | POA: Diagnosis not present

## 2019-05-16 DIAGNOSIS — M533 Sacrococcygeal disorders, not elsewhere classified: Secondary | ICD-10-CM | POA: Diagnosis not present

## 2019-05-16 DIAGNOSIS — M5416 Radiculopathy, lumbar region: Secondary | ICD-10-CM | POA: Diagnosis not present

## 2019-05-28 DIAGNOSIS — Z6841 Body Mass Index (BMI) 40.0 and over, adult: Secondary | ICD-10-CM | POA: Diagnosis not present

## 2019-06-06 ENCOUNTER — Telehealth: Payer: Self-pay

## 2019-06-06 ENCOUNTER — Other Ambulatory Visit: Payer: Self-pay | Admitting: Infectious Diseases

## 2019-06-06 MED ORDER — CLINDAMYCIN HCL 300 MG PO CAPS: 300.0000 mg | ORAL_CAPSULE | Freq: Three times a day (TID) | ORAL | 0 refills | Status: DC

## 2019-06-06 NOTE — Telephone Encounter (Signed)
Patient called reporting start of cellulitis in [place] again[place]. Requesting Abx. I reached out to Ravishankar and she sent in following:  sent 300mg  capsule 1 three times a day for 7 days. He should get an over the counter probiotic to take it with it- thx  Dr. Delaine Lame

## 2019-06-12 DIAGNOSIS — M7062 Trochanteric bursitis, left hip: Secondary | ICD-10-CM | POA: Diagnosis not present

## 2019-06-12 DIAGNOSIS — M67451 Ganglion, right hip: Secondary | ICD-10-CM | POA: Diagnosis not present

## 2019-06-12 DIAGNOSIS — M160 Bilateral primary osteoarthritis of hip: Secondary | ICD-10-CM | POA: Diagnosis not present

## 2019-06-12 DIAGNOSIS — M25451 Effusion, right hip: Secondary | ICD-10-CM | POA: Diagnosis not present

## 2019-06-12 DIAGNOSIS — M87051 Idiopathic aseptic necrosis of right femur: Secondary | ICD-10-CM | POA: Diagnosis not present

## 2019-06-12 DIAGNOSIS — M7061 Trochanteric bursitis, right hip: Secondary | ICD-10-CM | POA: Diagnosis not present

## 2019-06-12 DIAGNOSIS — R601 Generalized edema: Secondary | ICD-10-CM | POA: Diagnosis not present

## 2019-06-19 DIAGNOSIS — M9320 Osteochondritis dissecans of unspecified site: Secondary | ICD-10-CM | POA: Diagnosis not present

## 2019-06-19 DIAGNOSIS — M1611 Unilateral primary osteoarthritis, right hip: Secondary | ICD-10-CM | POA: Diagnosis not present

## 2019-06-22 ENCOUNTER — Other Ambulatory Visit: Payer: Self-pay

## 2019-06-22 ENCOUNTER — Ambulatory Visit: Payer: Medicaid Other | Admitting: Family Medicine

## 2019-06-22 ENCOUNTER — Telehealth: Payer: Self-pay | Admitting: Family Medicine

## 2019-06-22 ENCOUNTER — Encounter: Payer: Self-pay | Admitting: Family Medicine

## 2019-06-22 VITALS — BP 130/78 | HR 92 | Temp 98.2°F | Resp 18 | Ht 69.0 in | Wt 325.4 lb

## 2019-06-22 DIAGNOSIS — Z6841 Body Mass Index (BMI) 40.0 and over, adult: Secondary | ICD-10-CM | POA: Diagnosis not present

## 2019-06-22 DIAGNOSIS — L0390 Cellulitis, unspecified: Secondary | ICD-10-CM

## 2019-06-22 DIAGNOSIS — M25551 Pain in right hip: Secondary | ICD-10-CM | POA: Diagnosis not present

## 2019-06-22 DIAGNOSIS — M533 Sacrococcygeal disorders, not elsewhere classified: Secondary | ICD-10-CM | POA: Diagnosis not present

## 2019-06-22 DIAGNOSIS — Z01818 Encounter for other preprocedural examination: Secondary | ICD-10-CM

## 2019-06-22 DIAGNOSIS — Z87891 Personal history of nicotine dependence: Secondary | ICD-10-CM

## 2019-06-22 DIAGNOSIS — M5416 Radiculopathy, lumbar region: Secondary | ICD-10-CM | POA: Diagnosis not present

## 2019-06-22 DIAGNOSIS — M1610 Unilateral primary osteoarthritis, unspecified hip: Secondary | ICD-10-CM

## 2019-06-22 NOTE — Telephone Encounter (Signed)
Left message for patient on voicemail to follow up with Dr.Ran

## 2019-06-22 NOTE — Telephone Encounter (Signed)
Tiffany from [facility] called back.  States that surgeon wants to precede with surgery even through BMI is 48.0 - please complete surgical clearance form.

## 2019-06-22 NOTE — Telephone Encounter (Signed)
Spoke to Gerster at [facility] surgery will speak to Dr. Marica Otter about patients BMI over 48.0 and call office back

## 2019-06-22 NOTE — Telephone Encounter (Signed)
Please let patient know that he needs to schedule appointment for pre-op consult with Dr. Gwenevere Ghazi office to discuss his history of recurrent cellulitis and how best to proceed.  If he needs help with obtaining appointment, please assist.

## 2019-06-22 NOTE — Progress Notes (Addendum)
Name: Corey James   MRN: [id number]    DOB: 11-Feb-1956   Date:06/22/2019       Progress Note  Subjective  Chief Complaint  Chief Complaint  Patient presents with  . Hip Pain    fell off roof 7ft in October  . surgical clearance    right hip replacement schedule    HPI  Pt presents for presurgical clearance after not being seen by our clinic since April 2020.  OA Of RIGHT hip: He has been seeing Dr. Harlow Mares for care.  Last visit 06/19/19 with plan for right anterior approach for total hip arthroplasty.  He is here today for surgical clearance.  Obesity/Prediabetes: Has been seeing [facility], last visit 05/29/19. Most recent A1C was done with [facility] weight clinic 02/01/2019 - 5.7%.  Working hard on his diet, but has not been able to exercise secondary to pain.    Recurrent Cellulitis: He had been seeing Dr. Delaine Lame (ID) and [facility]) for care of recurrent cellultitis.  He has not had any issues in the last month (did have candida intertrigo 05/04/2019 which has since resolved.  LAst visit witih Dr. Delaine Lame was 04/10/2019 and she recommended to continue taking keflex 500mg  BID ongoing.   Cardiac: Denies any cardiac history, no chest pain, shortness of breath, palpitations.  Respiratory: He used to smoke 1ppd x40 years, quit 2 months ago.  He states has been tested for OSA about a year ago and was told he does not have OSA.  Does not use CPAP.   Patient Active Problem List   Diagnosis Date Noted  . Cellulitis 10/03/2018  . Sepsis ([place]) 09/05/2018  . Chronic bilateral low back pain with right-sided sciatica 08/31/2018  . Class 3 severe obesity due to excess calories with serious comorbidity and body mass index (BMI) of 50.0 to 59.9 in adult ([place]) 08/31/2018  . Nocturia 08/31/2018  . Necrotizing soft tissue infection   . Abscess of left lower extremity   . Thigh abscess 07/06/2018  . Arthritis of hip 06/09/2018  . DDD (degenerative disc  disease), lumbar 06/09/2018  . Morbid obesity with BMI of 45.0-49.9, adult ([place]) 06/09/2018    Past Surgical History:  Procedure Laterality Date  . APPLICATION OF WOUND VAC Left 07/14/2018   Procedure: WOUND VAC CHANGE;  Surgeon: Olean Ree, MD;  Location: [facility] ORS;  Service: General;  Laterality: Left;  . INCISION AND DRAINAGE ABSCESS Left 07/11/2018   Procedure: INCISION AND DRAINAGE ABSCESS-LEFT THIGH;  Surgeon: [name], MD;  Location: [facility] ORS;  Service: General;  Laterality: Left;  . WOUND DEBRIDEMENT Left 07/14/2018   Procedure: POSSIBLE DEBRIDEMENT WOUND-LEFT THIGH;  Surgeon: Olean Ree, MD;  Location: [facility] ORS;  Service: General;  Laterality: Left;    Family History  Problem Relation Age of Onset  . Dementia Mother   . Alzheimer's disease Mother   . CVA Father   . Cancer - Colon Father   . Heart attack Brother   . Hypertension Brother     Social History   Socioeconomic History  . Marital status: Widowed    Spouse name: Not on file  . Number of children: Not on file  . Years of education: 1  . Highest education level: High school graduate  Occupational History  . Occupation: Risk manager: [employer]  Tobacco Use  . Smoking status: Current Every Day Smoker    Packs/day: 1.00    Years: 40.00  Pack years: 40.00    Types: Cigarettes    Start date: 09/06/2018  . Smokeless tobacco: Current User  . Tobacco comment: Wellbutrin  Substance and Sexual Activity  . Alcohol use: Yes    Comment: 1/5 of vodka per month   . Drug use: Not Currently    Types: Marijuana  . Sexual activity: Yes    Partners: Female    Birth control/protection: None  Other Topics Concern  . Not on file  Social History Narrative  . Not on file   Social Determinants of Health   Financial Resource Strain: Low Risk   . Difficulty of Paying Living Expenses: Not hard at all  Food Insecurity: No Food Insecurity  . Worried About Charity fundraiser in the  Last Year: Never true  . Ran Out of Food in the Last Year: Never true  Transportation Needs: No Transportation Needs  . Lack of Transportation (Medical): No  . Lack of Transportation (Non-Medical): No  Physical Activity: Insufficiently Active  . Days of Exercise per Week: 2 days  . Minutes of Exercise per Session: 10 min  Stress: No Stress Concern Present  . Feeling of Stress : Not at all  Social Connections: Moderately Isolated  . Frequency of Communication with Friends and Family: More than three times a week  . Frequency of Social Gatherings with Friends and Family: More than three times a week  . Attends Religious Services: Never  . Active Member of Clubs or Organizations: No  . Attends Archivist Meetings: Never  . Marital Status: Widowed  Intimate Partner Violence: Not At Risk  . Fear of Current or Ex-Partner: No  . Emotionally Abused: No  . Physically Abused: No  . Sexually Abused: No     Current Outpatient Medications:  .  docusate sodium (COLACE) 100 MG capsule, Take 100 mg by mouth 2 (two) times daily., Disp: , Rfl:  .  naproxen sodium (ALEVE) 220 MG tablet, Take 220 mg by mouth., Disp: , Rfl:  .  nystatin cream (MYCOSTATIN), Apply 1 application topically 2 (two) times daily., Disp: 30 g, Rfl: 0 .  oxyCODONE-acetaminophen (PERCOCET/ROXICET) 5-325 MG tablet, Take by mouth every 4 (four) hours as needed for severe pain., Disp: , Rfl:  .  saccharomyces boulardii (FLORASTOR) 250 MG capsule, Take 1 capsule (250 mg total) by mouth daily., Disp: 30 capsule, Rfl: 3 .  cephALEXin (KEFLEX) 500 MG capsule, Take 1 capsule (500 mg total) by mouth 2 (two) times daily. (Patient not taking: Reported on 06/22/2019), Disp: 60 capsule, Rfl: 3  Allergies  Allergen Reactions  . Penicillins Itching and Rash    Did it involve swelling of the face/tongue/throat, SOB, or low BP? Unknown Did it involve sudden or severe rash/hives, skin peeling, or any reaction on the inside of your  mouth or nose? Yes Did you need to seek medical attention at a hospital or doctor's office? Unknown When did it last happen?Unknown If all above answers are "NO", may proceed with cephalosporin use.     I personally reviewed active problem list, medication list, allergies, health maintenance, notes from last encounter, lab results with the patient/caregiver today.   ROS  Constitutional: Negative for fever or weight change.  Respiratory: Negative for cough and shortness of breath.   Cardiovascular: Negative for chest pain or palpitations.  Gastrointestinal: Negative for abdominal pain, no bowel changes.  Musculoskeletal: Negative for gait problem or joint swelling.  Skin: Negative for rash.  Neurological: Negative for dizziness or  headache.  No other specific complaints in a complete review of systems (except as listed in HPI above).  Objective  Vitals:   06/22/19 0854  BP: 130/78  Pulse: 92  Resp: 18  Temp: 98.2 F (36.8 C)  TempSrc: Temporal  SpO2: 95%  Weight: (!) 325 lb 6.4 oz (147.6 kg)  Height: 5\' 9"  (1.753 m)    Body mass index is 48.05 kg/m.  Physical Exam  Constitutional: Patient appears well-developed and well-nourished. No distress.  HENT: Head: Normocephalic and atraumatic.  Mouth/Throat: Oropharynx is clear and moist. No oropharyngeal exudate or tonsillar swelling. Mallampati Score of 2-3 Eyes: Conjunctivae and EOM are normal. No scleral icterus.  Neck: Normal range of motion. Neck supple. No JVD present. No thyromegaly present.  Cardiovascular: Normal rate, regular rhythm and normal heart sounds.  No murmur heard. No BLE edema. Pulmonary/Chest: Effort normal and breath sounds normal. No respiratory distress. Abdominal: Soft. Bowel sounds are normal, no distension. There is no tenderness. No masses. Musculoskeletal: Normal range of motion, no joint effusions. No gross deformities Neurological: Pt is alert and oriented to person, place, and time. No  cranial nerve deficit. Coordination, balance, strength, speech and gait are normal.  Skin: Skin is warm and dry. No rash noted. No erythema.  Psychiatric: Patient has a normal mood and affect. behavior is normal. Judgment and thought content normal.  No results found for this or any previous visit (from the past 72 hour(s)).   PHQ2/9: Depression screen [facility] 2/9 06/22/2019 09/20/2018 09/14/2018 08/31/2018  Decreased Interest 0 0 0 1  Down, Depressed, Hopeless 0 0 0 0  PHQ - 2 Score 0 0 0 1  Altered sleeping 0 - 0 3  Tired, decreased energy 0 - 0 3  Change in appetite 0 - 0 2  Feeling bad or failure about yourself  0 - 0 0  Trouble concentrating 0 - 0 0  Moving slowly or fidgety/restless 0 - 0 0  Suicidal thoughts 0 - 0 0  PHQ-9 Score 0 - 0 9  Difficult doing work/chores Not difficult at all - Not difficult at all Somewhat difficult   PHQ-2/9 Result is negative.    Fall Risk: Fall Risk  06/22/2019 05/04/2019 12/22/2018 12/22/2018 10/09/2018  Falls in the past year? 1 0 0 0 0  Number falls in past yr: 0 - - 0 -  Injury with Fall? 1 - - 0 -  Follow up Falls evaluation completed - - - -   Assessment & Plan  1. Pre-op evaluation - CBC with Differential/Platelet; Future - Hemoglobin A1c; Future - Lipid panel; Future - EKG 12-Lead - sinus rhythm; will obtain lipids today to better stratify his cardiac risk. - DG Chest 2 View; Future - Comprehensive metabolic panel; Future - Urinalysis, Routine w reflex microscopic; Future  2. Arthritis of hip - Seeing Dr. Harlow Mares for planned total right hip arthroplasty  3. Recurrent cellulitis - I did send message to Dr. Delaine Lame for guidance on minimizing risk of infection.  I am concerned that surgical procedure could potentially result in infection of the incision and/or of the new joint.  Advised to continue Keflex 500mg  BID for now and will reach back out to the patient once I have response from Dr. Delaine Lame.  He verbalizes understanding of  this concern and his increased risk for infection. - CBC with Differential/Platelet; Future - Hemoglobin A1c; Future - Comprehensive metabolic panel; Future  4. History of tobacco abuse - He is congratulated on quitting 2 months  ago.  I advised he is still at increased risk for poor outcome with anesthesia because of this history.  I have ordered CXR per orders.   5. Morbid obesity with BMI of 45.0-49.9, adult ([place]) - He does not meet the recommended BMI of <40 for surgical clearance and I did advise him of this.  He verbalizes understanding and states that he would "rather take the risk and get this surgery done".  I Discussed increased risk of poor anesthesia outcome in detail with him and he does verbalize understanding.   At this point, I am awaiting laboratory and imaging results, along with response from his infectious disease physician for recommendation.  I did advise him that based on his BMI, long smoking history with only recent cessation, and recent history of hospitalizations for recurrent cellulitis, I am not comfortable clearing him for surgery today.

## 2019-06-25 NOTE — Telephone Encounter (Signed)
FYI

## 2019-06-25 NOTE — Telephone Encounter (Signed)
Left message for patient

## 2019-06-25 NOTE — Telephone Encounter (Signed)
We will just need to wait on clearance/any recommendations from infectious disease Dr. Delaine Lame - I did recommend he reach out to her office for appointment - please make sure patient is aware he needs to schedule this.

## 2019-06-26 ENCOUNTER — Ambulatory Visit: Payer: Medicaid Other | Admitting: Infectious Diseases

## 2019-06-26 NOTE — Telephone Encounter (Signed)
Patient called in to inform office he has an appointment with Dr.Ravishankar 06/28/19 at 10:00.

## 2019-06-27 DIAGNOSIS — Z6841 Body Mass Index (BMI) 40.0 and over, adult: Secondary | ICD-10-CM | POA: Diagnosis not present

## 2019-06-28 ENCOUNTER — Encounter: Payer: Self-pay | Admitting: Infectious Diseases

## 2019-06-28 ENCOUNTER — Other Ambulatory Visit: Payer: Self-pay

## 2019-06-28 ENCOUNTER — Ambulatory Visit: Payer: Medicaid Other | Attending: Infectious Diseases | Admitting: Infectious Diseases

## 2019-06-28 VITALS — BP 159/92 | HR 88 | Temp 98.2°F | Ht 69.0 in | Wt 326.0 lb

## 2019-06-28 DIAGNOSIS — Z872 Personal history of diseases of the skin and subcutaneous tissue: Secondary | ICD-10-CM

## 2019-06-28 DIAGNOSIS — Z800 Family history of malignant neoplasm of digestive organs: Secondary | ICD-10-CM | POA: Insufficient documentation

## 2019-06-28 DIAGNOSIS — F17211 Nicotine dependence, cigarettes, in remission: Secondary | ICD-10-CM

## 2019-06-28 DIAGNOSIS — Z823 Family history of stroke: Secondary | ICD-10-CM | POA: Diagnosis not present

## 2019-06-28 DIAGNOSIS — Z01818 Encounter for other preprocedural examination: Secondary | ICD-10-CM | POA: Insufficient documentation

## 2019-06-28 DIAGNOSIS — Z6841 Body Mass Index (BMI) 40.0 and over, adult: Secondary | ICD-10-CM | POA: Insufficient documentation

## 2019-06-28 DIAGNOSIS — M25551 Pain in right hip: Secondary | ICD-10-CM

## 2019-06-28 DIAGNOSIS — Z79899 Other long term (current) drug therapy: Secondary | ICD-10-CM | POA: Insufficient documentation

## 2019-06-28 DIAGNOSIS — L0390 Cellulitis, unspecified: Secondary | ICD-10-CM

## 2019-06-28 DIAGNOSIS — Z87891 Personal history of nicotine dependence: Secondary | ICD-10-CM | POA: Insufficient documentation

## 2019-06-28 DIAGNOSIS — Z880 Allergy status to penicillin: Secondary | ICD-10-CM | POA: Insufficient documentation

## 2019-06-28 DIAGNOSIS — Z820 Family history of epilepsy and other diseases of the nervous system: Secondary | ICD-10-CM | POA: Insufficient documentation

## 2019-06-28 DIAGNOSIS — Z8249 Family history of ischemic heart disease and other diseases of the circulatory system: Secondary | ICD-10-CM | POA: Diagnosis not present

## 2019-06-28 DIAGNOSIS — Z8619 Personal history of other infectious and parasitic diseases: Secondary | ICD-10-CM | POA: Insufficient documentation

## 2019-06-28 DIAGNOSIS — E669 Obesity, unspecified: Secondary | ICD-10-CM | POA: Diagnosis not present

## 2019-06-28 DIAGNOSIS — R600 Localized edema: Secondary | ICD-10-CM | POA: Diagnosis not present

## 2019-06-28 NOTE — Progress Notes (Signed)
NAME: Corey James  DOB: 01-13-1956  MRN: [id number]  Date/Time: 06/28/2019 10:37 AM  REQUESTING PROVIDER: [name] Subjective:  REASON FOR CONSULT: clearance to have rt hip replacement ? Corey James is a 64 y.o. male with obesity recurrent cellulitis left leg,left groin necrotizing skin and soft tissue infection in feb 2020 bilateral leg edema, rt hip pain is here to get clearance from ID perspective to have rt hip replacement  He is currently on suppressive keflex treatment and his left leg with compression stocking looks the best it has ever been  He has a complicated history of left leg infection He was initially hospitalized at [facility] between February 13 until February 22/ 2020  and had a necrotizing soft tissue infection of the left groin underwent excisional debridement.  At that time the culture was staph aureus and Streptococcus E.  He received intravenous ceftriaxone and clindamycin while in the hospital and sent home with wound VAC and p.o. Omnicef and Flagyl for 10 days. He was readmitted to the hospital on 09/04/2018 with cellulitis of the left leg and left thigh.  During that hospitalization he had a temperature of 101.5 with increasing WBC.  He was started on vancomycin and ceftriaxone.  The [place] culture from the small groin wound has Streptococcus C.  He was discharged on Keflex and clindamycin. He then followed up with wound clinic and had another admission to the hospital on 10/03/2018 because of redness of the leg.  During that hospitalization he was treated with IV antibiotics and then he was discharged on p.o. medication.  I saw him both as inpatient and outpatient and in May 2020 decided to put him on long-term suppressive therapy with Keflex.  He is not had any hospitalizations since then. He had a an ED visit on 04/08/2019.  Pt saw Dr.Bowers ortho because of severe hip pain and inability to walk and THR is planned for 08/02/19  He says he quit smoking 2 months ago   Past  Medical History:  Diagnosis Date  . Back pain   . Cellulitis   . Disc degeneration, lumbosacral   . Hepatitis C    ( RNA negative) Past Surgical History:  Procedure Laterality Date  . APPLICATION OF WOUND VAC Left 07/14/2018   Procedure: WOUND VAC CHANGE;  Surgeon: Olean Ree, MD;  Location: [facility] ORS;  Service: General;  Laterality: Left;  . INCISION AND DRAINAGE ABSCESS Left 07/11/2018   Procedure: INCISION AND DRAINAGE ABSCESS-LEFT THIGH;  Surgeon: [name], MD;  Location: [facility] ORS;  Service: General;  Laterality: Left;  . WOUND DEBRIDEMENT Left 07/14/2018   Procedure: POSSIBLE DEBRIDEMENT WOUND-LEFT THIGH;  Surgeon: Olean Ree, MD;  Location: [facility] ORS;  Service: General;  Laterality: Left;    Social History   Socioeconomic History  . Marital status: Widowed    Spouse name: Not on file  . Number of children: Not on file  . Years of education: 10  . Highest education level: High school graduate  Occupational History  . Occupation: Risk manager: [employer]  Tobacco Use  . Smoking status: Former Smoker    Packs/day: 1.00    Years: 40.00    Pack years: 40.00    Start date: 09/06/2018    Quit date: 04/27/2019    Years since quitting: 0.1  . Smokeless tobacco: Former Systems developer  . Tobacco comment: Wellbutrin  Substance and Sexual Activity  . Alcohol use: Yes    Comment: 1/5 of vodka per month   .  Drug use: Not Currently    Types: Marijuana  . Sexual activity: Yes    Partners: Female    Birth control/protection: None  Other Topics Concern  . Not on file  Social History Narrative  . Not on file   Social Determinants of Health   Financial Resource Strain: Low Risk   . Difficulty of Paying Living Expenses: Not hard at all  Food Insecurity: No Food Insecurity  . Worried About Charity fundraiser in the Last Year: Never true  . Ran Out of Food in the Last Year: Never true  Transportation Needs: No Transportation Needs  . Lack of Transportation  (Medical): No  . Lack of Transportation (Non-Medical): No  Physical Activity: Insufficiently Active  . Days of Exercise per Week: 2 days  . Minutes of Exercise per Session: 10 min  Stress: No Stress Concern Present  . Feeling of Stress : Not at all  Social Connections: Moderately Isolated  . Frequency of Communication with Friends and Family: More than three times a week  . Frequency of Social Gatherings with Friends and Family: More than three times a week  . Attends Religious Services: Never  . Active Member of Clubs or Organizations: No  . Attends Archivist Meetings: Never  . Marital Status: Widowed  Intimate Partner Violence: Not At Risk  . Fear of Current or Ex-Partner: No  . Emotionally Abused: No  . Physically Abused: No  . Sexually Abused: No    Family History  Problem Relation Age of Onset  . Dementia Mother   . Alzheimer's disease Mother   . CVA Father   . Cancer - Colon Father   . Heart attack Brother   . Hypertension Brother    Allergies  Allergen Reactions  . Penicillins Itching and Rash    Did it involve swelling of the face/tongue/throat, SOB, or low BP? Unknown Did it involve sudden or severe rash/hives, skin peeling, or any reaction on the inside of your mouth or nose? Yes Did you need to seek medical attention at a hospital or doctor's office? Unknown When did it last happen?Unknown If all above answers are "NO", may proceed with cephalosporin use.    ? Current Outpatient Medications  Medication Sig Dispense Refill  . cephALEXin (KEFLEX) 500 MG capsule Take 1 capsule (500 mg total) by mouth 2 (two) times daily. 60 capsule 3  . docusate sodium (COLACE) 100 MG capsule Take 100 mg by mouth 2 (two) times daily.    . naproxen sodium (ALEVE) 220 MG tablet Take 220 mg by mouth.    . nystatin cream (MYCOSTATIN) Apply 1 application topically 2 (two) times daily. 30 g 0  . oxyCODONE-acetaminophen (PERCOCET/ROXICET) 5-325 MG tablet Take by mouth  every 4 (four) hours as needed for severe pain.    [name] saccharomyces boulardii (FLORASTOR) 250 MG capsule Take 1 capsule (250 mg total) by mouth daily. 30 capsule 3   No current facility-administered medications for this visit.     Abtx:  Anti-infectives (From admission, onward)   None      REVIEW OF SYSTEMS:  Const: negative fever, negative chills, negative weight loss Eyes: negative diplopia or visual changes, negative eye pain ENT: negative coryza, negative sore throat Resp: negative cough, hemoptysis, has SOB on exertion Cards: negative for chest pain, palpitations, lower extremity edema GU: negative for frequency, dysuria and hematuria GI: Negative for abdominal pain, diarrhea, bleeding, constipation Skin: negative for rash and pruritus Heme: negative for easy bruising and  gum/nose bleeding MS: rt hip pain Neurolo:negative for headaches, dizziness, vertigo, memory problems  Psych: negative for feelings of anxiety, depression  Endocrine: negative for thyroid, diabetes Allergy/Immunology- negative for any medication or food allergies ?  Objective:  VITALS:  BP (!) 159/92   Pulse 88   Temp 98.2 F (36.8 C) (Oral)   Ht 5\' 9"  (1.753 m)   Wt (!) 326 lb (147.9 kg)   SpO2 94%   BMI 48.14 kg/m  PHYSICAL EXAM:  General: Alert, cooperative, no distress, appears stated age. Obese  Head: Normocephalic, without obvious abnormality, atraumatic. Eyes: Conjunctivae clear, anicteric sclerae. Pupils are equal ENT did not examine Neck: Supple, Back: No CVA tenderness. Lungs: Clear to auscultation bilaterally. No Wheezing or Rhonchi. No rales. Heart: Regular rate and rhythm, no murmur, rub or gallop. Abdomen: Soft, non-tender,not distended. Bowel sounds normal. No masses Extremities: atraumatic, no cyanosis. No edema. No clubbing Leg not swollen Skin looks good except for dryness and scars on his arms     inguinal skin- fine- no wounds  Skin: No rashes or lesions. Or bruising  Lymph: Cervical, supraclavicular normal. Neurologic: Grossly non-focal Pertinent Labs Lab Results CBC    Component Value Date/Time   WBC 15.6 (H) 04/08/2019 1749   RBC 4.97 04/08/2019 1749   HGB 15.5 04/08/2019 1749   HCT 46.6 04/08/2019 1749   PLT 151 04/08/2019 1749   MCV 93.8 04/08/2019 1749   MCH 31.2 04/08/2019 1749   MCHC 33.3 04/08/2019 1749   RDW 12.4 04/08/2019 1749   LYMPHSABS 0.9 04/08/2019 1749   MONOABS 0.4 04/08/2019 1749   EOSABS 0.1 04/08/2019 1749   BASOSABS 0.0 04/08/2019 1749    CMP Latest Ref Rng & Units 04/08/2019 12/05/2018 10/03/2018  Glucose 70 - 99 mg/dL 139(H) - 138(H)  BUN 8 - 23 mg/dL 20 - 16  Creatinine 0.61 - 1.24 mg/dL 1.03 - 1.13  Sodium 135 - 145 mmol/L 138 - 138  Potassium 3.5 - 5.1 mmol/L 4.2 - 3.9  Chloride 98 - 111 mmol/L 102 - 101  CO2 22 - 32 mmol/L 25 - 27  Calcium 8.9 - 10.3 mg/dL 9.1 - 9.1  Total Protein 6.5 - 8.1 g/dL 7.2 7.2 8.9(H)  Total Bilirubin 0.3 - 1.2 mg/dL 0.9 0.5 0.4  Alkaline Phos 38 - 126 U/L 57 65 88  AST 15 - 41 U/L 15 16 14(L)  ALT 0 - 44 U/L 16 16 18      ? Impression/Recommendation Pt here to get ID clearance for rt hip replacement  he has chronic edema legs and recurrent cellulitis and soft tissue infection on suppressive keflex for the past 6 months and doingwell  No evidence of cellulitis or swelling-  Continue compressionon stockings Continue keflex 500mg  BID  Preop  Protocol a week before surgery with nasal swab for MRSA/MSSA PCR Will need CHG baths ( hibiclens 4%) and mupirocin nasal ointment BID for 7 days This is ortho /joint replacement protocol and it should be followed  During surgery prophylaxis with cefazolin and may need vanco as well ? Will let Dr. Harlow Mares know as well ? ? ___________________________________________________ Discussed with patient,

## 2019-06-28 NOTE — Patient Instructions (Signed)
You are here referred by your PCP to get clearance before your rt hip replacement surgery You have had chronici edema legs left > rt with recurrent wounds and cellulitis with staph aureus and now you are on suppressive keflex therapy and stockings- your left leg looks the best it has ever been Continue to wear the compression stockings,keflex 500mg  PO BID A week before the surgery your orthopedic doctor will give you hibicleans to take shower with and mupirocin for nasal application and will also do a nasal swab to check FOR MRSA. Do not smoke Do not get your skin scratched or hit against your cars in the garage. Moisturize your skin in order to avoid tears

## 2019-07-09 ENCOUNTER — Other Ambulatory Visit
Admission: RE | Admit: 2019-07-09 | Discharge: 2019-07-09 | Disposition: A | Discharge status: Home / Self Care | Payer: Medicaid Other | Source: Ambulatory Visit | Attending: Family Medicine | Admitting: Family Medicine

## 2019-07-09 ENCOUNTER — Ambulatory Visit
Admission: RE | Admit: 2019-07-09 | Discharge: 2019-07-09 | Disposition: A | Discharge status: Home / Self Care | Payer: Medicaid Other | Source: Ambulatory Visit | Attending: Family Medicine | Admitting: Family Medicine

## 2019-07-09 ENCOUNTER — Other Ambulatory Visit: Payer: Self-pay

## 2019-07-09 DIAGNOSIS — Z01818 Encounter for other preprocedural examination: Secondary | ICD-10-CM | POA: Insufficient documentation

## 2019-07-09 LAB — HEMOGLOBIN A1C
Hgb A1c MFr Bld: 5.4 % (ref 4.8–5.6)
Mean Plasma Glucose: 108.28 mg/dL

## 2019-07-09 LAB — CBC WITH DIFFERENTIAL/PLATELET
Abs Immature Granulocytes: 0.02 10*3/uL (ref 0.00–0.07)
Basophils Absolute: 0.1 10*3/uL (ref 0.0–0.1)
Basophils Relative: 1 %
Eosinophils Absolute: 0.2 10*3/uL (ref 0.0–0.5)
Eosinophils Relative: 3 %
HCT: 49.8 % (ref 39.0–52.0)
Hemoglobin: 16.8 g/dL (ref 13.0–17.0)
Immature Granulocytes: 0 %
Lymphocytes Relative: 31 %
Lymphs Abs: 2 10*3/uL (ref 0.7–4.0)
MCH: 30.6 pg (ref 26.0–34.0)
MCHC: 33.7 g/dL (ref 30.0–36.0)
MCV: 90.7 fL (ref 80.0–100.0)
Monocytes Absolute: 0.4 10*3/uL (ref 0.1–1.0)
Monocytes Relative: 7 %
Neutro Abs: 3.8 10*3/uL (ref 1.7–7.7)
Neutrophils Relative %: 58 %
Platelets: 159 10*3/uL (ref 150–400)
RBC: 5.49 MIL/uL (ref 4.22–5.81)
RDW: 12.2 % (ref 11.5–15.5)
WBC: 6.4 10*3/uL (ref 4.0–10.5)
nRBC: 0 % (ref 0.0–0.2)

## 2019-07-09 LAB — URINALYSIS, ROUTINE W REFLEX MICROSCOPIC
Bacteria, UA: NONE SEEN
Bilirubin Urine: NEGATIVE
Glucose, UA: NEGATIVE mg/dL
Hgb urine dipstick: NEGATIVE
Ketones, ur: 5 mg/dL — AB
Leukocytes,Ua: NEGATIVE
Nitrite: NEGATIVE
Protein, ur: 30 mg/dL — AB
Specific Gravity, Urine: 1.033 — ABNORMAL HIGH (ref 1.005–1.030)
pH: 5 (ref 5.0–8.0)

## 2019-07-09 LAB — LIPID PANEL
Cholesterol: 204 mg/dL — ABNORMAL HIGH (ref 0–200)
HDL: 39 mg/dL — ABNORMAL LOW (ref >40)
LDL Cholesterol: 145 mg/dL — ABNORMAL HIGH (ref 0–99)
Total CHOL/HDL Ratio: 5.2 RATIO
Triglycerides: 100 mg/dL (ref <150)
VLDL: 20 mg/dL (ref 0–40)

## 2019-07-09 LAB — COMPREHENSIVE METABOLIC PANEL
ALT: 14 U/L (ref 0–44)
AST: 16 U/L (ref 15–41)
Albumin: 4.1 g/dL (ref 3.5–5.0)
Alkaline Phosphatase: 67 U/L (ref 38–126)
Anion gap: 8 (ref 5–15)
BUN: 16 mg/dL (ref 8–23)
CO2: 27 mmol/L (ref 22–32)
Calcium: 9.1 mg/dL (ref 8.9–10.3)
Chloride: 100 mmol/L (ref 98–111)
Creatinine, Ser: 0.84 mg/dL (ref 0.61–1.24)
GFR calc Af Amer: >60 mL/min (ref >60)
GFR calc non Af Amer: >60 mL/min (ref >60)
Glucose, Bld: 116 mg/dL — ABNORMAL HIGH (ref 70–99)
Potassium: 4.4 mmol/L (ref 3.5–5.1)
Sodium: 135 mmol/L (ref 135–145)
Total Bilirubin: 0.7 mg/dL (ref 0.3–1.2)
Total Protein: 7.7 g/dL (ref 6.5–8.1)

## 2019-07-11 ENCOUNTER — Other Ambulatory Visit: Payer: Self-pay | Admitting: Family Medicine

## 2019-07-11 DIAGNOSIS — R9389 Abnormal findings on diagnostic imaging of other specified body structures: Secondary | ICD-10-CM

## 2019-07-11 MED ORDER — ROSUVASTATIN CALCIUM 20 MG PO TABS: 20.0000 mg | ORAL_TABLET | Freq: Every day | ORAL | 1 refills | Status: DC

## 2019-07-11 NOTE — Progress Notes (Signed)
Called [facility] and spoke with pharmacist who said he already received this electronically.

## 2019-07-18 DIAGNOSIS — M5416 Radiculopathy, lumbar region: Secondary | ICD-10-CM | POA: Diagnosis not present

## 2019-07-18 DIAGNOSIS — M169 Osteoarthritis of hip, unspecified: Secondary | ICD-10-CM | POA: Diagnosis not present

## 2019-07-18 DIAGNOSIS — M533 Sacrococcygeal disorders, not elsewhere classified: Secondary | ICD-10-CM | POA: Diagnosis not present

## 2019-07-18 DIAGNOSIS — M25551 Pain in right hip: Secondary | ICD-10-CM | POA: Diagnosis not present

## 2019-07-18 DIAGNOSIS — G894 Chronic pain syndrome: Secondary | ICD-10-CM | POA: Diagnosis not present

## 2019-07-20 ENCOUNTER — Ambulatory Visit: Payer: Self-pay | Admitting: *Deleted

## 2019-07-20 DIAGNOSIS — Z598 Other problems related to housing and economic circumstances: Secondary | ICD-10-CM

## 2019-07-20 DIAGNOSIS — Z599 Problem related to housing and economic circumstances, unspecified: Secondary | ICD-10-CM

## 2019-07-20 NOTE — Chronic Care Management (AMB) (Signed)
   Care Management   Social Work Note  07/20/2019 Name: Corey James MRN: [id number] DOB: March 16, 1956  Corey James is a 64 y.o. year old male who sees Corey Hartshorn, FNP for primary care. The CCM team was consulted for assistance with [name] .   Phone call from patient requesting assistance with applying for subsidized housing. Housing resources to will be mailed to patient's home and will be reviewed with patient once received.  SDOH (Social Determinants of Health) assessments performed: No     Outpatient Encounter Medications as of 07/20/2019  Medication Sig  . cephALEXin (KEFLEX) 500 MG capsule Take 1 capsule (500 mg total) by mouth 2 (two) times daily.  [name] docusate sodium (COLACE) 100 MG capsule Take 100 mg by mouth 2 (two) times daily.  . naproxen sodium (ALEVE) 220 MG tablet Take 220 mg by mouth.  . nystatin cream (MYCOSTATIN) Apply 1 application topically 2 (two) times daily.  [name] oxyCODONE-acetaminophen (PERCOCET/ROXICET) 5-325 MG tablet Take by mouth every 4 (four) hours as needed for severe pain.  . rosuvastatin (CRESTOR) 20 MG tablet Take 1 tablet (20 mg total) by mouth daily.  [name] saccharomyces boulardii (FLORASTOR) 250 MG capsule Take 1 capsule (250 mg total) by mouth daily.   No facility-administered encounter medications on file as of 07/20/2019.    Goals Addressed            This Visit's Progress   . "I do not have insurance to pay my hospital bills"completed (pt-stated)       Current Barriers:  . Financial constraints . Medication procurement  Clinical Social Work Clinical Goal(s):  [name] Over the next 30 days, client will work with SW to address concerns related to filing for hardship related to medical bills and continued medical treatment   Interventions:  . Patient discussed now being approved  for Medicaid and plans to continue to follow up with current primary care provider. . Reminded patient to contact the business office to provide Medicaid number  to address outstanding medical bills . Patient discussed recent discharge from the hopsital with leg healing well . Patient discussed previous depressed mood due to financial issues, emotional support and supportive counseling provided. . Patient discussed continued plan to pursue Gastric Bypass Surgery at [facility] and is in the process of scheduling the initial virtual appointment    Patient Self Care Activities:  . Performs ADL's independently . Performs IADL's independently . Calls provider office for new concerns or questions  Please see past updates related to this goal by clicking on the "Past Updates" button in the selected goal      . "I need information on subsidized housing" (pt-stated)       [place] (see longtitudinal plan of care for additional care plan information)  Current Barriers:  . Housing barriers  Clinical Social Work Clinical Goal(s):  [name] Over the next 90 days, patient will follow up with the housing resources provided* as directed by SW  Interventions: . Patient interviewed and appropriate assessments performed . Provided patient with information about available housing options  Patient Self Care Activities:  . Performs ADL's independently . Performs IADL's independently . Knowledge deficit regarding subsidized housing options  Initial goal documentation        Follow Up Plan: SW will follow up with patient by phone over the next 2 weeks   Darris Carachure, [place] Worker  [facility] Care Management [phone]

## 2019-07-20 NOTE — Patient Instructions (Signed)
Thank you allowing the Chronic Care Management Team to be a part of your care! It was a pleasure speaking with you today!  1. Please call this social worker with any questions you may have regarding your community resource needs.  CCM (Chronic Care Management) Team   Neldon Labella RN, BSN Nurse Care Coordinator  [phone]  Ruben Reason PharmD  Clinical Pharmacist  [phone]   Elliot Gurney, LCSW Clinical Social Worker [phone]  Goals Addressed            This Visit's Progress   . "I do not have insurance to pay my hospital bills"completed (pt-stated)       Current Barriers:  . Financial constraints . Medication procurement  Clinical Social Work Clinical Goal(s):  [name] Over the next 30 days, client will work with SW to address concerns related to filing for hardship related to medical bills and continued medical treatment   Interventions:  . Patient discussed now being approved  for Medicaid and plans to continue to follow up with current primary care provider. . Reminded patient to contact the business office to provide Medicaid number to address outstanding medical bills . Patient discussed recent discharge from the hopsital with leg healing well . Patient discussed previous depressed mood due to financial issues, emotional support and supportive counseling provided. . Patient discussed continued plan to pursue Gastric Bypass Surgery at [facility] and is in the process of scheduling the initial virtual appointment    Patient Self Care Activities:  . Performs ADL's independently . Performs IADL's independently . Calls provider office for new concerns or questions  Please see past updates related to this goal by clicking on the "Past Updates" button in the selected goal      . "I need information on subsidized housing" (pt-stated)       [place] (see longtitudinal plan of care for additional care plan information)  Current Barriers:  . Housing  barriers  Clinical Social Work Clinical Goal(s):  [name] Over the next 90 days, patient will follow up with the housing resources provided* as directed by SW  Interventions: . Patient interviewed and appropriate assessments performed . Provided patient with information about available housing options  Patient Self Care Activities:  . Performs ADL's independently . Performs IADL's independently . Knowledge deficit regarding subsidized housing options  Initial goal documentation         The patient verbalized understanding of instructions provided today and declined a print copy of patient instruction materials.   Telephone follow up appointment with care management team member scheduled for: 07/22/19

## 2019-07-23 ENCOUNTER — Telehealth: Payer: Self-pay

## 2019-07-23 ENCOUNTER — Telehealth: Payer: Self-pay | Admitting: *Deleted

## 2019-07-23 ENCOUNTER — Ambulatory Visit: Payer: Self-pay | Admitting: *Deleted

## 2019-07-23 DIAGNOSIS — Z598 Other problems related to housing and economic circumstances: Secondary | ICD-10-CM

## 2019-07-23 DIAGNOSIS — Z599 Problem related to housing and economic circumstances, unspecified: Secondary | ICD-10-CM

## 2019-07-23 NOTE — Patient Instructions (Signed)
Thank you allowing the Chronic Care Management Team to be a part of your care! It was a pleasure speaking with you today!  1. Please call this social worker once housing resources are received.  CCM (Chronic Care Management) Team   Neldon Labella,  RN, BSN Nurse Care Coordinator  [phone]  Ruben Reason PharmD  Clinical Pharmacist  [phone]   Elliot Gurney, LCSW Clinical Social Worker [phone]  Goals Addressed            This Visit's Progress   . "I need information on subsidized housing" (pt-stated)       [place] (see longtitudinal plan of care for additional care plan information)  Current Barriers:  . Housing barriers  Clinical Social Work Clinical Goal(s):  [name] Over the next 90 days, patient will follow up with the housing resources provided* as directed by SW  Interventions: . Patient interviewed and appropriate assessments performed . Patient informed that housing resources have been mailed to patient's home for his review . Discussed that once housing resources are received, they can be reviewed with this social worker to develop a plan.   Patient Self Care Activities:  . Performs ADL's independently . Performs IADL's independently . Knowledge deficit regarding subsidized housing options  Please see past updates related to this goal by clicking on the "Past Updates" button in the selected goal          The patient verbalized understanding of instructions provided today and declined a print copy of patient instruction materials.   Telephone follow up appointment with care management team member scheduled for: 08/02/19

## 2019-07-23 NOTE — Chronic Care Management (AMB) (Signed)
  Care Management    Clinical Social Work Follow Up Note  07/23/2019 Name: Corey James MRN: [id number] DOB: 01/10/1956  Corey James is a 64 y.o. year old male who is a primary care patient of [name], FNP. The CCM team was consulted for assistance with [name] .   Review of patient status, including review of consultants reports, other relevant assessments, and collaboration with appropriate care team members and the patient's provider was performed as part of comprehensive patient evaluation and provision of chronic care management services.    SDOH (Social Determinants of Health) assessments performed: No    Advanced Directives Status: <no information> See Care Plan for related entries.   Outpatient Encounter Medications as of 07/23/2019  Medication Sig  . cephALEXin (KEFLEX) 500 MG capsule Take 1 capsule (500 mg total) by mouth 2 (two) times daily.  [name] docusate sodium (COLACE) 100 MG capsule Take 100 mg by mouth 2 (two) times daily.  . naproxen sodium (ALEVE) 220 MG tablet Take 220 mg by mouth.  . nystatin cream (MYCOSTATIN) Apply 1 application topically 2 (two) times daily.  [name] oxyCODONE-acetaminophen (PERCOCET/ROXICET) 5-325 MG tablet Take by mouth every 4 (four) hours as needed for severe pain.  . rosuvastatin (CRESTOR) 20 MG tablet Take 1 tablet (20 mg total) by mouth daily.  [name] saccharomyces boulardii (FLORASTOR) 250 MG capsule Take 1 capsule (250 mg total) by mouth daily.   No facility-administered encounter medications on file as of 07/23/2019.     Goals Addressed            This Visit's Progress   . "I need information on subsidized housing" (pt-stated)       [place] (see longtitudinal plan of care for additional care plan information)  Current Barriers:  . Housing barriers  Clinical Social Work Clinical Goal(s):  [name] Over the next 90 days, patient will follow up with the housing resources provided* as directed by SW  Interventions: . Patient  interviewed and appropriate assessments performed . Patient informed that housing resources have been mailed to patient's home for his review . Discussed that once housing resources are received, they can be reviewed with this social worker to develop a plan.   Patient Self Care Activities:  . Performs ADL's independently . Performs IADL's independently . Knowledge deficit regarding subsidized housing options  Please see past updates related to this goal by clicking on the "Past Updates" button in the selected goal          Follow Up Plan: SW will follow up with patient by phone over the next 7-14 business days    Elliot Gurney, [place] Worker  [facility] Care Management [phone]

## 2019-07-24 DIAGNOSIS — M25551 Pain in right hip: Secondary | ICD-10-CM | POA: Diagnosis not present

## 2019-07-24 DIAGNOSIS — M25651 Stiffness of right hip, not elsewhere classified: Secondary | ICD-10-CM | POA: Diagnosis not present

## 2019-08-02 ENCOUNTER — Telehealth: Payer: Self-pay | Admitting: *Deleted

## 2019-08-02 ENCOUNTER — Ambulatory Visit: Payer: Self-pay | Admitting: *Deleted

## 2019-08-02 DIAGNOSIS — G894 Chronic pain syndrome: Secondary | ICD-10-CM | POA: Diagnosis not present

## 2019-08-02 DIAGNOSIS — M87851 Other osteonecrosis, right femur: Secondary | ICD-10-CM | POA: Diagnosis not present

## 2019-08-02 DIAGNOSIS — M87051 Idiopathic aseptic necrosis of right femur: Secondary | ICD-10-CM | POA: Diagnosis not present

## 2019-08-02 DIAGNOSIS — T402X5A Adverse effect of other opioids, initial encounter: Secondary | ICD-10-CM | POA: Diagnosis not present

## 2019-08-02 DIAGNOSIS — K5903 Drug induced constipation: Secondary | ICD-10-CM | POA: Diagnosis not present

## 2019-08-02 DIAGNOSIS — Z6841 Body Mass Index (BMI) 40.0 and over, adult: Secondary | ICD-10-CM | POA: Diagnosis not present

## 2019-08-02 NOTE — Chronic Care Management (AMB) (Signed)
    Care Management   Unsuccessful Call Note 08/02/2019 Name: Corey James MRN: [id number] DOB: [birth year]  Patient is a 64 year old male who sees [name], FNP for primary care. [name], FNP asked the CCM team to consult the patient for housing resources.     This social worker was unable to reach patient via telephone today to follow up on housing resources previously provided . I have left HIPAA compliant voicemail asking patient to return my call. (unsuccessful outreach #1).   Plan: Will follow-up within 7 business days via telephone.     Elliot Gurney, [place] Administrator, [facility] Care Management [phone]

## 2019-08-03 DIAGNOSIS — M1611 Unilateral primary osteoarthritis, right hip: Secondary | ICD-10-CM | POA: Diagnosis not present

## 2019-08-07 DIAGNOSIS — R600 Localized edema: Secondary | ICD-10-CM | POA: Diagnosis not present

## 2019-08-07 DIAGNOSIS — Z87891 Personal history of nicotine dependence: Secondary | ICD-10-CM | POA: Diagnosis not present

## 2019-08-07 DIAGNOSIS — E785 Hyperlipidemia, unspecified: Secondary | ICD-10-CM | POA: Diagnosis not present

## 2019-08-07 DIAGNOSIS — Z471 Aftercare following joint replacement surgery: Secondary | ICD-10-CM | POA: Diagnosis not present

## 2019-08-07 DIAGNOSIS — M519 Unspecified thoracic, thoracolumbar and lumbosacral intervertebral disc disorder: Secondary | ICD-10-CM | POA: Diagnosis not present

## 2019-08-07 DIAGNOSIS — Z96641 Presence of right artificial hip joint: Secondary | ICD-10-CM | POA: Diagnosis not present

## 2019-08-07 DIAGNOSIS — Z79891 Long term (current) use of opiate analgesic: Secondary | ICD-10-CM | POA: Diagnosis not present

## 2019-08-08 DIAGNOSIS — M25551 Pain in right hip: Secondary | ICD-10-CM | POA: Diagnosis not present

## 2019-08-08 DIAGNOSIS — M533 Sacrococcygeal disorders, not elsewhere classified: Secondary | ICD-10-CM | POA: Diagnosis not present

## 2019-08-08 DIAGNOSIS — G894 Chronic pain syndrome: Secondary | ICD-10-CM | POA: Diagnosis not present

## 2019-08-08 DIAGNOSIS — M169 Osteoarthritis of hip, unspecified: Secondary | ICD-10-CM | POA: Diagnosis not present

## 2019-08-08 DIAGNOSIS — M5416 Radiculopathy, lumbar region: Secondary | ICD-10-CM | POA: Diagnosis not present

## 2019-08-09 ENCOUNTER — Telehealth: Payer: Self-pay | Admitting: *Deleted

## 2019-08-09 ENCOUNTER — Ambulatory Visit: Payer: Self-pay | Admitting: *Deleted

## 2019-08-09 NOTE — Chronic Care Management (AMB) (Signed)
    Care Management   Unsuccessful Call Note 08/09/2019 Name: Corey James MRN: [id number] DOB: [birth year]  Patient is a 64 year old male who sees [name], FNP for primary care. [name], FNP asked the CCM team to consult the patient for [facility].     This social worker was unable to reach patient via telephone today to follow up on housing resources previously provided. I have left HIPAA compliant voicemail asking patient to return my call. (unsuccessful outreach #2).   Plan: Will follow-up within 7 business days via telephone.     Elliot Gurney, [place] Administrator, [facility] Care Management [phone]

## 2019-08-16 ENCOUNTER — Telehealth: Payer: Self-pay | Admitting: *Deleted

## 2019-08-16 ENCOUNTER — Ambulatory Visit: Payer: Self-pay | Admitting: *Deleted

## 2019-08-16 NOTE — Chronic Care Management (AMB) (Signed)
   Chronic Care Management   Unsuccessful Call Note 08/16/2019 Name: Corey James MRN: [id number] DOB: [birth year]  Patient is a 64 year old male who sees [name], FNP  for primary care. [name], FNP asked the CCM team to consult the patient for [facility].   This social worker was unable to reach patient via telephone today for follow up call regarding housing resources mailed to her. I have left HIPAA compliant voicemail asking patient to return my call. (unsuccessful outreach #3).   Plan: This Education officer, museum will not make any additional outreach calls to patient, however will be happy to engage patient upon his return call.     Elliot Gurney, [place] Administrator, [facility] Care Management [phone]

## 2019-08-29 DIAGNOSIS — M25559 Pain in unspecified hip: Secondary | ICD-10-CM | POA: Diagnosis not present

## 2019-08-29 DIAGNOSIS — M169 Osteoarthritis of hip, unspecified: Secondary | ICD-10-CM | POA: Diagnosis not present

## 2019-08-29 DIAGNOSIS — G894 Chronic pain syndrome: Secondary | ICD-10-CM | POA: Diagnosis not present

## 2019-08-29 DIAGNOSIS — M5416 Radiculopathy, lumbar region: Secondary | ICD-10-CM | POA: Diagnosis not present

## 2019-08-29 DIAGNOSIS — M533 Sacrococcygeal disorders, not elsewhere classified: Secondary | ICD-10-CM | POA: Diagnosis not present

## 2019-08-29 DIAGNOSIS — M25551 Pain in right hip: Secondary | ICD-10-CM | POA: Diagnosis not present

## 2019-09-11 DIAGNOSIS — Z96641 Presence of right artificial hip joint: Secondary | ICD-10-CM | POA: Diagnosis not present

## 2019-09-26 DIAGNOSIS — Z5181 Encounter for therapeutic drug level monitoring: Secondary | ICD-10-CM | POA: Diagnosis not present

## 2019-09-26 DIAGNOSIS — M25559 Pain in unspecified hip: Secondary | ICD-10-CM | POA: Diagnosis not present

## 2019-09-26 DIAGNOSIS — M5416 Radiculopathy, lumbar region: Secondary | ICD-10-CM | POA: Diagnosis not present

## 2019-09-26 DIAGNOSIS — M25551 Pain in right hip: Secondary | ICD-10-CM | POA: Diagnosis not present

## 2019-09-26 DIAGNOSIS — Z96641 Presence of right artificial hip joint: Secondary | ICD-10-CM | POA: Diagnosis not present

## 2019-09-26 DIAGNOSIS — M533 Sacrococcygeal disorders, not elsewhere classified: Secondary | ICD-10-CM | POA: Diagnosis not present

## 2019-09-26 DIAGNOSIS — Z79899 Other long term (current) drug therapy: Secondary | ICD-10-CM | POA: Diagnosis not present

## 2019-09-26 DIAGNOSIS — G894 Chronic pain syndrome: Secondary | ICD-10-CM | POA: Diagnosis not present

## 2019-09-26 DIAGNOSIS — M169 Osteoarthritis of hip, unspecified: Secondary | ICD-10-CM | POA: Diagnosis not present

## 2019-10-25 DIAGNOSIS — M25551 Pain in right hip: Secondary | ICD-10-CM | POA: Diagnosis not present

## 2019-10-25 DIAGNOSIS — Z96641 Presence of right artificial hip joint: Secondary | ICD-10-CM | POA: Diagnosis not present

## 2019-10-25 DIAGNOSIS — M5416 Radiculopathy, lumbar region: Secondary | ICD-10-CM | POA: Diagnosis not present

## 2019-10-25 DIAGNOSIS — Z79899 Other long term (current) drug therapy: Secondary | ICD-10-CM | POA: Diagnosis not present

## 2019-10-25 DIAGNOSIS — G894 Chronic pain syndrome: Secondary | ICD-10-CM | POA: Diagnosis not present

## 2019-10-25 DIAGNOSIS — M533 Sacrococcygeal disorders, not elsewhere classified: Secondary | ICD-10-CM | POA: Diagnosis not present

## 2019-10-25 DIAGNOSIS — M25559 Pain in unspecified hip: Secondary | ICD-10-CM | POA: Diagnosis not present

## 2019-10-25 DIAGNOSIS — M169 Osteoarthritis of hip, unspecified: Secondary | ICD-10-CM | POA: Diagnosis not present

## 2019-11-06 ENCOUNTER — Ambulatory Visit: Payer: Self-pay | Admitting: *Deleted

## 2019-11-06 DIAGNOSIS — Z599 Problem related to housing and economic circumstances, unspecified: Secondary | ICD-10-CM

## 2019-11-06 DIAGNOSIS — M5136 Other intervertebral disc degeneration, lumbar region: Secondary | ICD-10-CM

## 2019-11-06 NOTE — Chronic Care Management (AMB) (Addendum)
  Chronic Care Management   Social Work Note  11/06/2019 Name: Corey James MRN: [id number] DOB: [birth year]  Corey James is a 64 y.o. year old male who sees [name], FNP for primary care. The CCM team was consulted for assistance with [name] .   Phone call from patient stating that he needs information on housing. Per patient, he is experiencing conflict with his significant other and would like to move to affordable housing. This social worker explained that moving into income based housing is a process and may take several months. Confirmed with patient that he has received the housing resources previously mailed to him and stronlgy encouraged that he make contact with the resources provided for possible housing.  SDOH (Social Determinants of Health) assessments performed: No     Outpatient Encounter Medications as of 11/06/2019  Medication Sig  . cephALEXin (KEFLEX) 500 MG capsule Take 1 capsule (500 mg total) by mouth 2 (two) times daily.  [name] docusate sodium (COLACE) 100 MG capsule Take 100 mg by mouth 2 (two) times daily.  . naproxen sodium (ALEVE) 220 MG tablet Take 220 mg by mouth.  . nystatin cream (MYCOSTATIN) Apply 1 application topically 2 (two) times daily.  [name] oxyCODONE-acetaminophen (PERCOCET/ROXICET) 5-325 MG tablet Take by mouth every 4 (four) hours as needed for severe pain.  . rosuvastatin (CRESTOR) 20 MG tablet Take 1 tablet (20 mg total) by mouth daily.  [name] saccharomyces boulardii (FLORASTOR) 250 MG capsule Take 1 capsule (250 mg total) by mouth daily.   No facility-administered encounter medications on file as of 11/06/2019.    Goals Addressed              This Visit's Progress   .  "I need information on subsidized housing" completed (pt-stated)        [place] (see longtitudinal plan of care for additional care plan information)  Current Barriers:  . Housing barriers  Clinical Social Work Clinical Goal(s):  [name] Over the next 90 days,  patient will follow up with the housing resources provided* as directed by SW  Interventions: . Patient interviewed and appropriate assessments performed . Patient discussed need to move due to conflicts with significant other . Emotional support provided, confirmed that patient had received the housing resources previously provided . Affordable housing application process discussed, patient encouraged to begin application process due to long waiting list involved . Patient verbalized understanding of this plan and will call this social worker with questions.   Patient Self Care Activities:  . Performs ADL's independently . Performs IADL's independently . Knowledge deficit regarding subsidized housing options  Please see past updates related to this goal by clicking on the "Past Updates" button in the selected goal         Follow Up Plan: Client will follow up with housing resources provided previously    [name], [place] Worker  [facility] Care [facility] [phone]

## 2019-11-06 NOTE — Patient Instructions (Signed)
Thank you allowing the Chronic Care Management Team to be a part of your care! It was a pleasure speaking with you today!  1. Please review housing resources previously provided to begin application process for housing  CCM (Chronic Care Management) Team   Neldon Labella RN, BSN Nurse Care Coordinator  [phone]  [place], [place] Social Worker [phone]  Goals Addressed              This Visit's Progress   .  "I need information on subsidized housing" completed (pt-stated)        [place] (see longtitudinal plan of care for additional care plan information)  Current Barriers:  . Housing barriers  Clinical Social Work Clinical Goal(s):  [name] Over the next 90 days, patient will follow up with the housing resources provided* as directed by SW  Interventions: . Patient interviewed and appropriate assessments performed . Patient discussed need to move due to conflicts with significant other . Emotional support provided, confirmed that patient had received the housing resources previously provided . Affordable housing application process discussed, patient encouraged to begin application process due to long waiting list involved . Patient verbalized understanding of this plan and will call this social worker with questions.   Patient Self Care Activities:  . Performs ADL's independently . Performs IADL's independently . Knowledge deficit regarding subsidized housing options  Please see past updates related to this goal by clicking on the "Past Updates" button in the selected goal          The patient verbalized understanding of instructions provided today and declined a print copy of patient instruction materials.   No further follow up required: patient will conitnue to review housing resources provided

## 2019-11-06 NOTE — Chronic Care Management (AMB) (Signed)
  Chronic Care Management   Social Work Note  11/06/2019 Name: Corey James MRN: [id number] DOB: 12-23-1955  Corey James is a 64 y.o. year old male who sees [name], FNP for primary care. The CCM team was consulted for assistance with [name] .   SDOH (Social Determinants of Health) assessments performed: No   Follow up phone call to patient. Per patient, things have seemed to calm down with girlfriend and the need to move is no longer an issue. Per patient, conflicts are a common occurrence. Creating space during those times highly encouraged as well as follow up with resources provided.     Outpatient Encounter Medications as of 11/06/2019  Medication Sig  . cephALEXin (KEFLEX) 500 MG capsule Take 1 capsule (500 mg total) by mouth 2 (two) times daily.  [name] docusate sodium (COLACE) 100 MG capsule Take 100 mg by mouth 2 (two) times daily.  . naproxen sodium (ALEVE) 220 MG tablet Take 220 mg by mouth.  . nystatin cream (MYCOSTATIN) Apply 1 application topically 2 (two) times daily.  [name] oxyCODONE-acetaminophen (PERCOCET/ROXICET) 5-325 MG tablet Take by mouth every 4 (four) hours as needed for severe pain.  . rosuvastatin (CRESTOR) 20 MG tablet Take 1 tablet (20 mg total) by mouth daily.  [name] saccharomyces boulardii (FLORASTOR) 250 MG capsule Take 1 capsule (250 mg total) by mouth daily.   No facility-administered encounter medications on file as of 11/06/2019.    Goals Addressed   None     Follow Up Plan: Client will follow up with housing resources provided.  Elliot Gurney, [place] Administrator, [facility] [phone]

## 2019-11-07 ENCOUNTER — Other Ambulatory Visit: Payer: Self-pay

## 2019-11-07 MED ORDER — ROSUVASTATIN CALCIUM 20 MG PO TABS: 20.0000 mg | ORAL_TABLET | Freq: Every day | ORAL | 0 refills | Status: DC

## 2019-11-14 DIAGNOSIS — Z79899 Other long term (current) drug therapy: Secondary | ICD-10-CM | POA: Diagnosis not present

## 2019-11-14 DIAGNOSIS — M25559 Pain in unspecified hip: Secondary | ICD-10-CM | POA: Diagnosis not present

## 2019-11-14 DIAGNOSIS — G894 Chronic pain syndrome: Secondary | ICD-10-CM | POA: Diagnosis not present

## 2019-11-14 DIAGNOSIS — M25551 Pain in right hip: Secondary | ICD-10-CM | POA: Diagnosis not present

## 2019-11-14 DIAGNOSIS — M533 Sacrococcygeal disorders, not elsewhere classified: Secondary | ICD-10-CM | POA: Diagnosis not present

## 2019-11-14 DIAGNOSIS — M169 Osteoarthritis of hip, unspecified: Secondary | ICD-10-CM | POA: Diagnosis not present

## 2019-11-14 DIAGNOSIS — Z96641 Presence of right artificial hip joint: Secondary | ICD-10-CM | POA: Diagnosis not present

## 2019-11-14 DIAGNOSIS — M5416 Radiculopathy, lumbar region: Secondary | ICD-10-CM | POA: Diagnosis not present

## 2019-12-14 ENCOUNTER — Ambulatory Visit: Payer: Medicaid Other | Admitting: Internal Medicine

## 2019-12-26 DIAGNOSIS — G894 Chronic pain syndrome: Secondary | ICD-10-CM | POA: Diagnosis not present

## 2020-01-01 ENCOUNTER — Ambulatory Visit: Payer: Medicaid Other | Admitting: Internal Medicine

## 2020-01-09 DIAGNOSIS — Z79899 Other long term (current) drug therapy: Secondary | ICD-10-CM | POA: Diagnosis not present

## 2020-01-09 DIAGNOSIS — M25551 Pain in right hip: Secondary | ICD-10-CM | POA: Diagnosis not present

## 2020-01-09 DIAGNOSIS — M169 Osteoarthritis of hip, unspecified: Secondary | ICD-10-CM | POA: Diagnosis not present

## 2020-01-09 DIAGNOSIS — Z96641 Presence of right artificial hip joint: Secondary | ICD-10-CM | POA: Diagnosis not present

## 2020-01-09 DIAGNOSIS — G894 Chronic pain syndrome: Secondary | ICD-10-CM | POA: Diagnosis not present

## 2020-01-09 DIAGNOSIS — M533 Sacrococcygeal disorders, not elsewhere classified: Secondary | ICD-10-CM | POA: Diagnosis not present

## 2020-01-09 DIAGNOSIS — M25559 Pain in unspecified hip: Secondary | ICD-10-CM | POA: Diagnosis not present

## 2020-01-09 DIAGNOSIS — M5416 Radiculopathy, lumbar region: Secondary | ICD-10-CM | POA: Diagnosis not present

## 2020-03-12 DIAGNOSIS — Z79891 Long term (current) use of opiate analgesic: Secondary | ICD-10-CM | POA: Diagnosis not present

## 2020-03-12 DIAGNOSIS — Z5181 Encounter for therapeutic drug level monitoring: Secondary | ICD-10-CM | POA: Diagnosis not present

## 2020-03-20 DIAGNOSIS — M65322 Trigger finger, left index finger: Secondary | ICD-10-CM | POA: Diagnosis not present

## 2020-03-20 DIAGNOSIS — G5603 Carpal tunnel syndrome, bilateral upper limbs: Secondary | ICD-10-CM | POA: Diagnosis not present

## 2020-04-02 ENCOUNTER — Other Ambulatory Visit: Payer: Self-pay

## 2020-04-02 DIAGNOSIS — Z1211 Encounter for screening for malignant neoplasm of colon: Secondary | ICD-10-CM

## 2020-04-02 MED ORDER — SUPREP BOWEL PREP KIT 17.5-3.13-1.6 GM/177ML PO SOLN: 1.0000 | ORAL | 0 refills | Status: DC

## 2020-04-18 ENCOUNTER — Other Ambulatory Visit: Admission: RE | Admit: 2020-04-18 | Payer: Medicaid Other | Source: Ambulatory Visit

## 2020-04-21 ENCOUNTER — Telehealth: Payer: Self-pay | Admitting: Gastroenterology

## 2020-04-21 NOTE — Telephone Encounter (Signed)
Patient didn't go for Covid Testing and patient wants to reschedule procedure..Patient's wife stated that no one told him to go get covid tested or where to go.Marland KitchenCMA was informed.

## 2020-04-21 NOTE — Telephone Encounter (Signed)
Patient states he forgot to go get his covid test and now his colonoscopy for tomorrow 11.29.21 with Dr. Allen Norris has been canceled. Pt would like a cb to resch.

## 2020-04-21 NOTE — Telephone Encounter (Signed)
Called patient to get procedure rescheduled. LVM offering two dates to reschedule colonoscopy.

## 2020-04-22 ENCOUNTER — Other Ambulatory Visit: Payer: Self-pay

## 2020-04-22 ENCOUNTER — Encounter: Admission: RE | Payer: Self-pay | Source: Home / Self Care

## 2020-04-22 ENCOUNTER — Ambulatory Visit: Admission: RE | Admit: 2020-04-22 | Payer: Medicaid Other | Source: Home / Self Care | Admitting: Gastroenterology

## 2020-04-22 DIAGNOSIS — Z1211 Encounter for screening for malignant neoplasm of colon: Secondary | ICD-10-CM | POA: Insufficient documentation

## 2020-04-22 DIAGNOSIS — K219 Gastro-esophageal reflux disease without esophagitis: Secondary | ICD-10-CM | POA: Insufficient documentation

## 2020-04-22 SURGERY — COLONOSCOPY WITH PROPOFOL
Anesthesia: General

## 2020-04-22 NOTE — Telephone Encounter (Signed)
Patient calling back to resch procedure. Patient states he gave his new number to [facility], but it wasn't put in the system, so we were calling the wrong number. Please call pt back at [phone].

## 2020-04-22 NOTE — Progress Notes (Signed)
Procedure has been reschedule and updated instructions will be mailed out.

## 2020-04-29 ENCOUNTER — Other Ambulatory Visit: Payer: Self-pay

## 2020-04-29 ENCOUNTER — Encounter: Payer: Self-pay | Admitting: Gastroenterology

## 2020-04-29 ENCOUNTER — Ambulatory Visit (INDEPENDENT_AMBULATORY_CARE_PROVIDER_SITE_OTHER): Payer: Medicaid Other | Admitting: Gastroenterology

## 2020-04-29 VITALS — BP 166/111 | HR 77 | Temp 98.3°F | Wt 324.0 lb

## 2020-04-29 DIAGNOSIS — T402X5A Adverse effect of other opioids, initial encounter: Secondary | ICD-10-CM | POA: Insufficient documentation

## 2020-04-29 DIAGNOSIS — Z9889 Other specified postprocedural states: Secondary | ICD-10-CM | POA: Insufficient documentation

## 2020-04-29 DIAGNOSIS — K219 Gastro-esophageal reflux disease without esophagitis: Secondary | ICD-10-CM

## 2020-04-29 DIAGNOSIS — B182 Chronic viral hepatitis C: Secondary | ICD-10-CM | POA: Diagnosis not present

## 2020-04-29 DIAGNOSIS — G894 Chronic pain syndrome: Secondary | ICD-10-CM | POA: Insufficient documentation

## 2020-04-29 DIAGNOSIS — M87059 Idiopathic aseptic necrosis of unspecified femur: Secondary | ICD-10-CM | POA: Insufficient documentation

## 2020-04-29 DIAGNOSIS — K5903 Drug induced constipation: Secondary | ICD-10-CM | POA: Insufficient documentation

## 2020-04-29 DIAGNOSIS — M25559 Pain in unspecified hip: Secondary | ICD-10-CM | POA: Insufficient documentation

## 2020-04-29 DIAGNOSIS — E785 Hyperlipidemia, unspecified: Secondary | ICD-10-CM | POA: Insufficient documentation

## 2020-04-29 MED ORDER — PANTOPRAZOLE SODIUM 40 MG PO TBEC: 40.0000 mg | DELAYED_RELEASE_TABLET | Freq: Every day | ORAL | 6 refills | Status: DC

## 2020-04-29 NOTE — Progress Notes (Signed)
Primary Care Physician: Towanda Malkin, MD  Primary Gastroenterologist:  Dr. Lucilla Lame  Chief Complaint  Patient presents with  . Hepatitis C  . Gastroesophageal Reflux    Patient stated that he has GERD.    HPI: Corey James is a 64 y.o. male here for follow-up after being treated for hepatitis C.  The patient has had normal liver enzymes as of late.  There is no complaints at the present time.  The patient reports that he has been having some problems with reflux.  He states that it is mostly in the morning and throughout the day.  There is no report of any food getting stuck on the way down.  He also denies any unexplained weight loss.  The patient was supposed to have an EGD and colonoscopy this month but did not go for his Covid test so it has been rescheduled for January 4.  Past Medical History:  Diagnosis Date  . Back pain   . Cellulitis   . Disc degeneration, lumbosacral   . Hepatitis C     Current Outpatient Medications  Medication Sig Dispense Refill  . cephALEXin (KEFLEX) 500 MG capsule Take 1 capsule (500 mg total) by mouth 2 (two) times daily. 60 capsule 3  . docusate sodium (COLACE) 100 MG capsule Take 100 mg by mouth 2 (two) times daily.    . Na Sulfate-K Sulfate-Mg Sulf (SUPREP BOWEL PREP KIT) 17.5-3.13-1.6 GM/177ML SOLN Take 1 kit by mouth as directed. 354 mL 0  . naproxen sodium (ALEVE) 220 MG tablet Take 220 mg by mouth.    . nystatin cream (MYCOSTATIN) Apply 1 application topically 2 (two) times daily. 30 g 0  . oxyCODONE-acetaminophen (PERCOCET/ROXICET) 5-325 MG tablet Take by mouth every 4 (four) hours as needed for severe pain.    . pantoprazole (PROTONIX) 40 MG tablet Take 1 tablet (40 mg total) by mouth daily. 30 tablet 6  . rosuvastatin (CRESTOR) 20 MG tablet Take 1 tablet (20 mg total) by mouth daily. 90 tablet 0  . saccharomyces boulardii (FLORASTOR) 250 MG capsule Take 1 capsule (250 mg total) by mouth daily. 30 capsule 3   No current  facility-administered medications for this visit.    Allergies as of 04/29/2020 - Review Complete 04/29/2020  Allergen Reaction Noted  . Penicillins Itching and Rash 07/03/2011    ROS:  General: Negative for anorexia, weight loss, fever, chills, fatigue, weakness. ENT: Negative for hoarseness, difficulty swallowing , nasal congestion. CV: Negative for chest pain, angina, palpitations, dyspnea on exertion, peripheral edema.  Respiratory: Negative for dyspnea at rest, dyspnea on exertion, cough, sputum, wheezing.  GI: See history of present illness. GU:  Negative for dysuria, hematuria, urinary incontinence, urinary frequency, nocturnal urination.  Endo: Negative for unusual weight change.    Physical Examination:   BP (!) 166/111   Pulse 77   Temp 98.3 F (36.8 C) (Oral)   Wt (!) 324 lb (147 kg)   BMI 47.85 kg/m   General: Well-nourished, well-developed in no acute distress.  Eyes: No icterus. Conjunctivae pink. Lungs: Clear to auscultation bilaterally. Non-labored. Heart: Regular rate and rhythm, no murmurs rubs or gallops.  Abdomen: Bowel sounds are normal, nontender, nondistended, no hepatosplenomegaly or masses, no abdominal bruits or hernia , no rebound or guarding.   Extremities: No lower extremity edema. No clubbing or deformities. Neuro: Alert and oriented x 3.  Grossly intact. Skin: Warm and dry, no jaundice.   Psych: Alert and cooperative, normal mood  and affect.  Labs:    Imaging Studies: No results found.  Assessment and Plan:   Corey James is a 64 y.o. y/o male who comes in today with a history of hepatitis C and he has completed treatment.  The patient will have his labs checked for liver enzymes and viral load.  The patient is more than a year out from treatment.  The patient will also be set up for an EGD for his GERD and will be started on Protonix.  He will also be set up for colonoscopy for screening purposes.  These have been prescheduled for January  4.  The patient has been explained the plan and agrees with it.     Lucilla Lame, MD. Marval Regal    Note: This dictation was prepared with Dragon dictation along with smaller phrase technology. Any transcriptional errors that result from this process are unintentional.

## 2020-04-30 LAB — HCV RNA QUANT

## 2020-05-01 LAB — HEPATIC FUNCTION PANEL
ALT: 15 IU/L (ref 0–44)
AST: 15 IU/L (ref 0–40)
Albumin: 4.4 g/dL (ref 3.8–4.8)
Alkaline Phosphatase: 85 IU/L (ref 44–121)
Bilirubin Total: 0.5 mg/dL (ref 0.0–1.2)
Bilirubin, Direct: 0.14 mg/dL (ref 0.00–0.40)
Total Protein: 7.3 g/dL (ref 6.0–8.5)

## 2020-05-01 LAB — HCV RNA QUANT: Hepatitis C Quantitation: NOT DETECTED IU/mL

## 2020-05-02 ENCOUNTER — Telehealth: Payer: Self-pay | Admitting: Gastroenterology

## 2020-05-02 ENCOUNTER — Telehealth: Payer: Self-pay

## 2020-05-02 NOTE — Telephone Encounter (Signed)
Pt has been rescheduled for his colonoscopy on 05/08/20.

## 2020-05-02 NOTE — Telephone Encounter (Signed)
-----   Message from Lucilla Lame, MD sent at 05/01/2020 10:46 AM EST ----- With the patient know that the hepatitis C viral load was negative and his liver enzymes were normal.

## 2020-05-02 NOTE — Telephone Encounter (Signed)
Pt called ans states that his symptoms are worsening, wants to know if procedure can be done sooner than 05/27/20

## 2020-05-02 NOTE — Telephone Encounter (Signed)
Pt notified of lab results

## 2020-05-06 ENCOUNTER — Other Ambulatory Visit: Payer: Self-pay

## 2020-05-06 ENCOUNTER — Other Ambulatory Visit
Admission: RE | Admit: 2020-05-06 | Discharge: 2020-05-06 | Disposition: A | Discharge status: Home / Self Care | Payer: Medicaid Other | Source: Ambulatory Visit | Attending: Gastroenterology | Admitting: Gastroenterology

## 2020-05-06 DIAGNOSIS — Z20822 Contact with and (suspected) exposure to covid-19: Secondary | ICD-10-CM | POA: Diagnosis not present

## 2020-05-06 DIAGNOSIS — Z01812 Encounter for preprocedural laboratory examination: Secondary | ICD-10-CM | POA: Insufficient documentation

## 2020-05-06 LAB — SARS CORONAVIRUS 2 (TAT 6-24 HRS): SARS Coronavirus 2: NEGATIVE

## 2020-05-08 ENCOUNTER — Ambulatory Visit: Payer: Medicaid Other | Admitting: Anesthesiology

## 2020-05-08 ENCOUNTER — Other Ambulatory Visit: Payer: Self-pay

## 2020-05-08 ENCOUNTER — Ambulatory Visit
Admission: RE | Admit: 2020-05-08 | Discharge: 2020-05-08 | Disposition: A | Discharge status: Home / Self Care | Payer: Medicaid Other | Attending: Gastroenterology | Admitting: Gastroenterology

## 2020-05-08 ENCOUNTER — Encounter
Admission: RE | Disposition: A | Discharge status: Home / Self Care | Payer: Self-pay | Source: Home / Self Care | Attending: Gastroenterology

## 2020-05-08 ENCOUNTER — Encounter: Payer: Self-pay | Admitting: Gastroenterology

## 2020-05-08 DIAGNOSIS — K219 Gastro-esophageal reflux disease without esophagitis: Secondary | ICD-10-CM

## 2020-05-08 DIAGNOSIS — Z87891 Personal history of nicotine dependence: Secondary | ICD-10-CM | POA: Diagnosis not present

## 2020-05-08 DIAGNOSIS — E785 Hyperlipidemia, unspecified: Secondary | ICD-10-CM | POA: Diagnosis not present

## 2020-05-08 DIAGNOSIS — D123 Benign neoplasm of transverse colon: Secondary | ICD-10-CM | POA: Diagnosis not present

## 2020-05-08 DIAGNOSIS — K635 Polyp of colon: Secondary | ICD-10-CM | POA: Diagnosis not present

## 2020-05-08 DIAGNOSIS — D122 Benign neoplasm of ascending colon: Secondary | ICD-10-CM | POA: Insufficient documentation

## 2020-05-08 DIAGNOSIS — T184XXA Foreign body in colon, initial encounter: Secondary | ICD-10-CM | POA: Insufficient documentation

## 2020-05-08 DIAGNOSIS — X58XXXA Exposure to other specified factors, initial encounter: Secondary | ICD-10-CM | POA: Insufficient documentation

## 2020-05-08 DIAGNOSIS — Z1211 Encounter for screening for malignant neoplasm of colon: Secondary | ICD-10-CM | POA: Insufficient documentation

## 2020-05-08 DIAGNOSIS — R12 Heartburn: Secondary | ICD-10-CM | POA: Insufficient documentation

## 2020-05-08 DIAGNOSIS — D125 Benign neoplasm of sigmoid colon: Secondary | ICD-10-CM | POA: Diagnosis not present

## 2020-05-08 DIAGNOSIS — Z79899 Other long term (current) drug therapy: Secondary | ICD-10-CM | POA: Insufficient documentation

## 2020-05-08 HISTORY — PX: ESOPHAGOGASTRODUODENOSCOPY (EGD) WITH PROPOFOL: SHX5813

## 2020-05-08 HISTORY — PX: COLONOSCOPY WITH PROPOFOL: SHX5780

## 2020-05-08 SURGERY — COLONOSCOPY WITH PROPOFOL
Anesthesia: General

## 2020-05-08 MED ORDER — PROPOFOL 10 MG/ML IV BOLUS
INTRAVENOUS | Status: DC | PRN
  Administered 2020-05-08: 70 mg via INTRAVENOUS

## 2020-05-08 MED ORDER — GLYCOPYRROLATE 0.2 MG/ML IJ SOLN
INTRAMUSCULAR | Status: DC | PRN
  Administered 2020-05-08: .2 mg via INTRAVENOUS

## 2020-05-08 MED ORDER — PROPOFOL 500 MG/50ML IV EMUL
INTRAVENOUS | Status: DC | PRN
  Administered 2020-05-08: 140 ug/kg/min via INTRAVENOUS

## 2020-05-08 MED ORDER — ESOMEPRAZOLE MAGNESIUM 40 MG PO CPDR: 40.0000 mg | DELAYED_RELEASE_CAPSULE | Freq: Every day | ORAL | 1 refills | Status: DC

## 2020-05-08 MED ORDER — LIDOCAINE HCL (CARDIAC) PF 100 MG/5ML IV SOSY
PREFILLED_SYRINGE | INTRAVENOUS | Status: DC | PRN
  Administered 2020-05-08: 100 mg via INTRAVENOUS

## 2020-05-08 MED ORDER — SODIUM CHLORIDE 0.9 % IV SOLN: INTRAVENOUS | Status: DC

## 2020-05-08 NOTE — H&P (Addendum)
Corey Lame, MD [address] [place],  [address] Phone: [phone] Fax : [phone]  Primary Care Physician:  Towanda Malkin, MD Primary Gastroenterologist:  Dr. Allen Norris  Pre-Procedure History & Physical: HPI:  Corey James is a 64 y.o. male is here for a screening colonoscopy and EGD .   Past Medical History:  Diagnosis Date  . Back pain   . Cellulitis   . Disc degeneration, lumbosacral   . Hepatitis C     Past Surgical History:  Procedure Laterality Date  . APPLICATION OF WOUND VAC Left 07/14/2018   Procedure: WOUND VAC CHANGE;  Surgeon: Olean Ree, MD;  Location: [facility] ORS;  Service: General;  Laterality: Left;  . INCISION AND DRAINAGE ABSCESS Left 07/11/2018   Procedure: INCISION AND DRAINAGE ABSCESS-LEFT THIGH;  Surgeon: [name], MD;  Location: [facility] ORS;  Service: General;  Laterality: Left;  . WOUND DEBRIDEMENT Left 07/14/2018   Procedure: POSSIBLE DEBRIDEMENT WOUND-LEFT THIGH;  Surgeon: Olean Ree, MD;  Location: [facility] ORS;  Service: General;  Laterality: Left;    Prior to Admission medications   Medication Sig Start Date End Date Taking? Authorizing Provider  docusate sodium (COLACE) 100 MG capsule Take 100 mg by mouth 2 (two) times daily.   Yes [provider]  oxyCODONE-acetaminophen (PERCOCET/ROXICET) 5-325 MG tablet Take by mouth every 4 (four) hours as needed for severe pain.   Yes [provider]  pantoprazole (PROTONIX) 40 MG tablet Take 1 tablet (40 mg total) by mouth daily. 04/29/20  Yes Corey Lame, MD  cephALEXin (KEFLEX) 500 MG capsule Take 1 capsule (500 mg total) by mouth 2 (two) times daily. Patient not taking: Reported on 05/08/2020 04/10/19   Tsosie Billing, MD  Na Sulfate-K Sulfate-Mg Sulf (SUPREP BOWEL PREP KIT) 17.5-3.13-1.6 GM/177ML SOLN Take 1 kit by mouth as directed. 04/02/20   Corey Lame, MD  naproxen sodium (ALEVE) 220 MG tablet Take 220 mg by mouth.    [provider]  nystatin cream (MYCOSTATIN) Apply 1 application topically 2 (two) times daily. 05/07/19   Tylene Fantasia, PA-C  rosuvastatin (CRESTOR) 20 MG tablet Take 1 tablet (20 mg total) by mouth daily. Patient not taking: Reported on 05/08/2020 11/07/19   Steele Sizer, MD  saccharomyces boulardii (FLORASTOR) 250 MG capsule Take 1 capsule (250 mg total) by mouth daily. Patient not taking: Reported on 05/08/2020 04/10/19   Tsosie Billing, MD    Allergies as of 04/22/2020 - Review Complete 06/22/2019  Allergen Reaction Noted  . Penicillins Itching and Rash 07/03/2011    Family History  Problem Relation Age of Onset  . Dementia Mother   . Alzheimer's disease Mother   . CVA Father   . Cancer - Colon Father   . Heart attack Brother   . Hypertension Brother     Social History   Socioeconomic History  . Marital status: Widowed    Spouse name: Not on file  . Number of children: Not on file  . Years of education: 68  . Highest education level: High school graduate  Occupational History  . Occupation: Risk manager: [employer]  Tobacco Use  . Smoking status: Former Smoker    Packs/day: 1.00    Years: 40.00    Pack years: 40.00    Start date: 09/06/2018    Quit date: 04/27/2019    Years since quitting: 1.0  . Smokeless tobacco: Former Systems developer  . Tobacco comment: Wellbutrin  Vaping  Use  . Vaping Use: Never used  Substance and Sexual Activity  . Alcohol use: Yes    Comment: 1/5 of vodka per month   . Drug use: Not Currently    Types: Marijuana  . Sexual activity: Yes    Partners: Female    Birth control/protection: None  Other Topics Concern  . Not on file  Social History Narrative  . Not on file   Social Determinants of Health   Financial Resource Strain: Not on file  Food Insecurity: Not on file  Transportation Needs: Not on file  Physical Activity: Not on file  Stress: Not on file  Social Connections: Not on file  Intimate  Partner Violence: Not on file    Review of Systems: See HPI, otherwise negative ROS  Physical Exam: There were no vitals taken for this visit. General:   Alert,  pleasant and cooperative in NAD Head:  Normocephalic and atraumatic. Neck:  Supple; no masses or thyromegaly. Lungs:  Clear throughout to auscultation.    Heart:  Regular rate and rhythm. Abdomen:  Soft, nontender and nondistended. Normal bowel sounds, without guarding, and without rebound.   Neurologic:  Alert and  oriented x4;  grossly normal neurologically.  Impression/Plan: Corey James is now here to undergo a screening colonoscopy and EGD.  Risks, benefits, and alternatives regarding colonoscopy and EGD have been reviewed with the patient.  Questions have been answered.  All parties agreeable.

## 2020-05-08 NOTE — Op Note (Signed)
[facility] Patient Name: Corey James Procedure Date: 05/08/2020 9:40 AM MRN: [id number] Account #: [id number] Date of Birth: 1955/06/13 Admit Type: Outpatient Age: 64 Room: [facility] ENDO ROOM 4 Gender: Male Note Status: Finalized Procedure:             Colonoscopy Indications:           Screening for colorectal malignant neoplasm Providers:             Lucilla Lame MD, MD Medicines:             Propofol per Anesthesia Complications:         No immediate complications. Procedure:             Pre-Anesthesia Assessment:                        - Prior to the procedure, a History and Physical was                         performed, and patient medications and allergies were                         reviewed. The patient's tolerance of previous                         anesthesia was also reviewed. The risks and benefits                         of the procedure and the sedation options and risks                         were discussed with the patient. All questions were                         answered, and informed consent was obtained. Prior                         Anticoagulants: The patient has taken no previous                         anticoagulant or antiplatelet agents. ASA Grade                         Assessment: II - A patient with mild systemic disease.                         After reviewing the risks and benefits, the patient                         was deemed in satisfactory condition to undergo the                         procedure.                        After obtaining informed consent, the colonoscope was                         passed under direct vision. Throughout the procedure,  the patient's blood pressure, pulse, and oxygen                         saturations were monitored continuously. The                         Colonoscope was introduced through the anus and                         advanced to the the cecum,  identified by appendiceal                         orifice and ileocecal valve. The colonoscopy was                         performed without difficulty. The patient tolerated                         the procedure well. The quality of the bowel                         preparation was good. Findings:      The perianal and digital rectal examinations were normal.      Two sessile polyps were found in the transverse colon. The polyps were 5       to 6 mm in size. These polyps were removed with a cold snare. Resection       and retrieval were complete.      Two sessile polyps were found in the ascending colon. The polyps were 4       to 5 mm in size. These polyps were removed with a cold snare. Resection       and retrieval were complete.      A 2 mm polyp was found in the cecum. The polyp was sessile. The polyp       was removed with a cold snare. Resection and retrieval were complete. Impression:            - Two 5 to 6 mm polyps in the transverse colon,                         removed with a cold snare. Resected and retrieved.                        - Two 4 to 5 mm polyps in the ascending colon, removed                         with a cold snare. Resected and retrieved.                        - One 2 mm polyp in the cecum, removed with a cold                         snare. Resected and retrieved. Recommendation:        - Discharge patient to home.                        - Resume previous diet.                        -  Continue present medications.                        - Await pathology results.                        - Repeat colonoscopy in 5 years if polyp adenoma and                         10 years if hyperplastic Procedure Code(s):     --- Professional ---                        [phone], Colonoscopy, flexible; with removal of                         tumor(s), polyp(s), or other lesion(s) by snare                         technique Diagnosis Code(s):     --- Professional ---                         Z12.11, Encounter for screening for malignant neoplasm                         of colon                        K63.5, Polyp of colon CPT copyright 2019 American Medical Association. All rights reserved. The codes documented in this report are preliminary and upon coder review may  be revised to meet current compliance requirements. Lucilla Lame MD, MD 05/08/2020 10:15:49 AM This report has been signed electronically. Number of Addenda: 0 Note Initiated On: 05/08/2020 9:40 AM Scope Withdrawal Time: 0 hours 7 minutes 58 seconds  Total Procedure Duration: 0 hours 10 minutes 58 seconds  Estimated Blood Loss:  Estimated blood loss: none.      [facility]

## 2020-05-08 NOTE — Transfer of Care (Signed)
Immediate Anesthesia Transfer of Care Note  Patient: Corey James  Procedure(s) Performed: COLONOSCOPY WITH PROPOFOL (N/A ) ESOPHAGOGASTRODUODENOSCOPY (EGD) WITH PROPOFOL (N/A )  Patient Location: PACU  Anesthesia Type:General  Level of Consciousness: sedated  Airway & Oxygen Therapy: Patient Spontanous Breathing and Patient connected to face mask oxygen  Post-op Assessment: Report given to RN and Post -op Vital signs reviewed and stable  Post vital signs: Reviewed and stable  Last Vitals:  Vitals Value Taken Time  BP    Temp    Pulse    Resp    SpO2      Last Pain:  Vitals:   05/08/20 0906  TempSrc: Temporal  PainSc: 5          Complications: No complications documented.

## 2020-05-08 NOTE — Anesthesia Preprocedure Evaluation (Addendum)
Anesthesia Evaluation  Patient identified by MRN, date of birth, ID band Patient awake    Reviewed: Allergy & Precautions, H&P , NPO status , Patient's Chart, lab work & pertinent test results  Airway Mallampati: III       Dental   Pulmonary neg pulmonary ROS, Current Smoker, former smoker,    Pulmonary exam normal        Cardiovascular Exercise Tolerance: Good negative cardio ROS Normal cardiovascular exam     Neuro/Psych  Neuromuscular disease negative psych ROS   GI/Hepatic GERD  ,(+) Hepatitis -  Endo/Other  negative endocrine ROS  Renal/GU negative Renal ROS  negative genitourinary   Musculoskeletal  (+) Arthritis , Osteoarthritis,    Abdominal Normal abdominal exam  (+)   Peds negative pediatric ROS (+)  Hematology negative hematology ROS (+)   Anesthesia Other Findings Past Medical History: No date: Back pain No date: Cellulitis No date: Disc degeneration, lumbosacral No date: Hepatitis C  Reproductive/Obstetrics negative OB ROS                             Anesthesia Physical  Anesthesia Plan  ASA: III  Anesthesia Plan: General   Post-op Pain Management:    Induction: Intravenous  PONV Risk Score and Plan: Propofol infusion  Airway Management Planned: Nasal Cannula  Additional Equipment:   Intra-op Plan:   Post-operative Plan:   Informed Consent: I have reviewed the patients History and Physical, chart, labs and discussed the procedure including the risks, benefits and alternatives for the proposed anesthesia with the patient or authorized representative who has indicated his/her understanding and acceptance.       Plan Discussed with: CRNA and Surgeon  Anesthesia Plan Comments:         Anesthesia Quick Evaluation

## 2020-05-08 NOTE — Anesthesia Postprocedure Evaluation (Signed)
Anesthesia Post Note  Patient: MAYSON MCNEISH  Procedure(s) Performed: COLONOSCOPY WITH PROPOFOL (N/A ) ESOPHAGOGASTRODUODENOSCOPY (EGD) WITH PROPOFOL (N/A )  Patient location during evaluation: Endoscopy Anesthesia Type: General Level of consciousness: awake and alert and oriented Pain management: pain level controlled Vital Signs Assessment: post-procedure vital signs reviewed and stable Respiratory status: spontaneous breathing Cardiovascular status: blood pressure returned to baseline Anesthetic complications: no   No complications documented.   Last Vitals:  Vitals:   05/08/20 1015 05/08/20 1035  BP: 137/75 (!) 141/84  Pulse:    Resp:    Temp: (!) 35.8 C   SpO2:      Last Pain:  Vitals:   05/08/20 1035  TempSrc:   PainSc: 0-No pain                 Tylor Courtwright

## 2020-05-08 NOTE — Op Note (Signed)
[facility] Patient Name: Corey James Procedure Date: 05/08/2020 9:41 AM MRN: [id number] Account #: [id number] Date of Birth: 05-19-1956 Admit Type: Outpatient Age: 64 Room: [facility] ENDO ROOM 4 Gender: Male Note Status: Finalized Procedure:             Upper GI endoscopy Indications:           Heartburn Providers:             Lucilla Lame MD, MD Medicines:             Propofol per Anesthesia Complications:         No immediate complications. Procedure:             Pre-Anesthesia Assessment:                        - Prior to the procedure, a History and Physical was                         performed, and patient medications and allergies were                         reviewed. The patient's tolerance of previous                         anesthesia was also reviewed. The risks and benefits                         of the procedure and the sedation options and risks                         were discussed with the patient. All questions were                         answered, and informed consent was obtained. Prior                         Anticoagulants: The patient has taken no previous                         anticoagulant or antiplatelet agents. ASA Grade                         Assessment: II - A patient with mild systemic disease.                         After reviewing the risks and benefits, the patient                         was deemed in satisfactory condition to undergo the                         procedure.                        After obtaining informed consent, the endoscope was                         passed under direct vision. Throughout the procedure,  the patient's blood pressure, pulse, and oxygen                         saturations were monitored continuously. The Endoscope                         was introduced through the mouth, and advanced to the                         second part of duodenum. The upper GI  endoscopy was                         accomplished without difficulty. The patient tolerated                         the procedure well. Findings:      The examined esophagus was normal.      The stomach was normal.      The examined duodenum was normal. Impression:            - Normal esophagus.                        - Normal stomach.                        - Normal examined duodenum.                        - No specimens collected. Recommendation:        - Discharge patient to home.                        - Resume previous diet.                        - Continue present medications.                        - Perform a colonoscopy today. Procedure Code(s):     --- Professional ---                        [phone], Esophagogastroduodenoscopy, flexible,                         transoral; diagnostic, including collection of                         specimen(s) by brushing or washing, when performed                         (separate procedure) Diagnosis Code(s):     --- Professional ---                        R12, Heartburn CPT copyright 2019 American Medical Association. All rights reserved. The codes documented in this report are preliminary and upon coder review may  be revised to meet current compliance requirements. Lucilla Lame MD, MD 05/08/2020 9:59:21 AM This report has been signed electronically. Number of Addenda: 0 Note Initiated On: 05/08/2020 9:41 AM Estimated Blood Loss:  Estimated blood loss: none.  [facility]

## 2020-05-09 ENCOUNTER — Emergency Department
Admission: EM | Admit: 2020-05-09 | Discharge: 2020-05-09 | Discharge status: Against Medical Advice | Payer: Medicaid Other

## 2020-05-09 ENCOUNTER — Ambulatory Visit: Payer: Self-pay

## 2020-05-09 DIAGNOSIS — R079 Chest pain, unspecified: Secondary | ICD-10-CM | POA: Diagnosis not present

## 2020-05-09 DIAGNOSIS — R0789 Other chest pain: Secondary | ICD-10-CM | POA: Diagnosis not present

## 2020-05-09 DIAGNOSIS — R0602 Shortness of breath: Secondary | ICD-10-CM | POA: Diagnosis not present

## 2020-05-09 LAB — SURGICAL PATHOLOGY

## 2020-05-09 NOTE — ED Triage Notes (Signed)
First RN Note: Pt to ED via [facility] with c/o substernal CP that radiates to both arm with associated SOB. Pt has had increased difficulty with acid reflux, pt had endoscopy yesterday.    324 ASA 1 SL Nitro 20 to L forearm  NSR  5/10 ->2/10

## 2020-05-09 NOTE — Progress Notes (Signed)
Patient ID: Corey James, male    DOB: [birth year], 64 y.o.   MRN: [id number]  PCP: Towanda Malkin, MD  Chief Complaint  Patient presents with  . Gastroesophageal Reflux  . Shortness of Breath    Subjective:   Corey James is a 64 y.o. male, presents to clinic with CC of the following:  Chief Complaint  Patient presents with  . Gastroesophageal Reflux  . Shortness of Breath    HPI:  Patient is a 64 year old male Last visit at [facility] was with [name] in January 2021 Follows up today after he left this message late last week.  Pt. Reports x 2 months he has had burning in his "esophagus and throat. It makes me feel short of breath when it happens." States he had "a scope yesterday and it was all clear." "I feel like I have reflux going on."  He did go to the emergency department 05/09/2020, although left without being seen.  On Emily's visit in January, the following was noted: Obesity/Prediabetes: Had been seeing [facility] clinic,   Lab Results  Component Value Date   HGBA1C 5.4 07/09/2019   HGBA1C 5.3 09/15/2018   HGBA1C 6.0 (H) 07/13/2018   Lab Results  Component Value Date   LDLCALC 145 (H) 07/09/2019   CREATININE 0.84 07/09/2019    Cardiac: Denies any cardiac history, no chest pain, shortness of breath, palpitations.  Respiratory: He used to smoke 1ppd x40 years, quit in late 2020.  Reported prior testing for OSA   He saw GI 04/29/2020 with the following assessment/plan noted:  YUYA VANWINGERDEN is a 64 y.o. y/o male who comes in today with a history of hepatitis C and he has completed treatment.  The patient will have his labs checked for liver enzymes and viral load.  The patient is more than a year out from treatment.  The patient will also be set up for an EGD for his GERD and will be started on Protonix.  He will also be set up for colonoscopy for screening purposes.    Liver enzymes were normal and the hepatitis C viral load was  negative He had an EGD and colonoscopy done 05/08/2020 The EGD was normal. Colonoscopy had multiple polyps, awaiting pathology evaluation  He notes in the past 2 to 3 weeks he has had increasing reflux symptoms, increased burping and gas, starts burning, feels painful in the center of chest and up into the throat and burns like crazy and feels like food coming up, feels like having a heart attack at times when has these attacks of reflux as sometimes gets SOB, and at times feels tingling in the upper extremities with the shortness of breath.  Notes his reflux symptoms are worse at night, and has trouble sleeping, and is often in a recliner to help.  Sometimes bothers him when he gets up and about.  He takes Tums to help, using 10-15 a day, and also is on Nexium 40 mg daily. He does drink coffee.  Denies alcohol use.  Denies NSAID use. He is morbidly obese, and was seeing [facility] weight clinic, with bariatric surgery entertained. He mentioned he might want to get an ultrasound to check out his gallbladder.  Has not had any bad right upper quadrant abdominal pains. As noted above, he did see GI with the upper endoscopy done as part of the evaluation on 05/08/2020 which was unremarkable. He denies any black or dark stools, no  bleeding per rectum.  No nausea or vomiting,   Patient Active Problem List   Diagnosis Date Noted  . Polyp of transverse colon   . Aseptic necrosis of bone of hip ([place]) 04/29/2020  . Chronic pain syndrome 04/29/2020  . Hip pain 04/29/2020  . History of repair of hip joint 04/29/2020  . Hyperlipidemia 04/29/2020  . Therapeutic opioid induced constipation 04/29/2020  . Colon cancer screening 04/22/2020  . Gastroesophageal reflux disease 04/22/2020  . History of tobacco abuse 06/22/2019  . Recurrent cellulitis 10/03/2018  . Sepsis ([place]) 09/05/2018  . Chronic bilateral low back pain with right-sided sciatica 08/31/2018  . Class 3 severe obesity due to excess calories with  serious comorbidity and body mass index (BMI) of 50.0 to 59.9 in adult ([place]) 08/31/2018  . Nocturia 08/31/2018  . Necrotizing soft tissue infection   . Abscess of left lower extremity   . Thigh abscess 07/06/2018  . Arthritis of hip 06/09/2018  . DDD (degenerative disc disease), lumbar 06/09/2018  . Morbid obesity with BMI of 45.0-49.9, adult ([place]) 06/09/2018      Current Outpatient Medications:  .  esomeprazole (NEXIUM) 40 MG capsule, Take 1 capsule (40 mg total) by mouth daily., Disp: 30 capsule, Rfl: 1 .  HYDROcodone-acetaminophen (NORCO) 10-325 MG tablet, Take 1 tablet by mouth 4 (four) times daily as needed., Disp: , Rfl:  .  LINZESS 145 MCG CAPS capsule, Take 145 mcg by mouth daily., Disp: , Rfl:  .  nystatin cream (MYCOSTATIN), Apply 1 application topically 2 (two) times daily., Disp: 30 g, Rfl: 0 .  SENEXON-S 8.6-50 MG tablet, Take by mouth., Disp: , Rfl:    Allergies  Allergen Reactions  . Penicillins Itching and Rash    Did it involve swelling of the face/tongue/throat, SOB, or low BP? Unknown Did it involve sudden or severe rash/hives, skin peeling, or any reaction on the inside of your mouth or nose? Yes Did you need to seek medical attention at a hospital or doctor's office? Unknown When did it last happen?Unknown If all above answers are "NO", may proceed with cephalosporin use.  Did it involve swelling of the face/tongue/throat, SOB, or low BP? Unknown Did it involve sudden or severe rash/hives, skin peeling, or any reaction on the inside of your mouth or nose? Yes Did you need to seek medical attention at a hospital or doctor's office? Unknown When did it last happen?Unknown If all above answers are "NO", may proceed with cephalosporin use. Did it involve swelling of the face/tongue/throat, SOB, or low BP? Unknown Did it involve sudden or severe rash/hives, skin peeling, or any reaction on the inside of your mouth or nose? Yes Did you need to seek  medical attention at a hospital or doctor's office? Unknown When did it last happen?Unknown If all above answers are "NO", may proceed with cephalosporin use.     Past Surgical History:  Procedure Laterality Date  . APPLICATION OF WOUND VAC Left 07/14/2018   Procedure: WOUND VAC CHANGE;  Surgeon: Olean Ree, MD;  Location: [facility] ORS;  Service: General;  Laterality: Left;  . COLONOSCOPY WITH PROPOFOL N/A 05/08/2020   Procedure: COLONOSCOPY WITH PROPOFOL;  Surgeon: Lucilla Lame, MD;  Location: [facility] ENDOSCOPY;  Service: Endoscopy;  Laterality: N/A;  . ESOPHAGOGASTRODUODENOSCOPY (EGD) WITH PROPOFOL N/A 05/08/2020   Procedure: ESOPHAGOGASTRODUODENOSCOPY (EGD) WITH PROPOFOL;  Surgeon: Lucilla Lame, MD;  Location: [facility] ENDOSCOPY;  Service: Endoscopy;  Laterality: N/A;  . INCISION AND DRAINAGE ABSCESS Left 07/11/2018   Procedure: INCISION AND  DRAINAGE ABSCESS-LEFT THIGH;  Surgeon: [name], MD;  Location: [facility] ORS;  Service: General;  Laterality: Left;  . WOUND DEBRIDEMENT Left 07/14/2018   Procedure: POSSIBLE DEBRIDEMENT WOUND-LEFT THIGH;  Surgeon: Olean Ree, MD;  Location: [facility] ORS;  Service: General;  Laterality: Left;     Family History  Problem Relation Age of Onset  . Dementia Mother   . Alzheimer's disease Mother   . CVA Father   . Cancer - Colon Father   . Heart attack Brother   . Hypertension Brother      Social History   Tobacco Use  . Smoking status: Former Smoker    Packs/day: 1.00    Years: 40.00    Pack years: 40.00    Start date: 09/06/2018    Quit date: 04/27/2019    Years since quitting: 1.0  . Smokeless tobacco: Former Systems developer  . Tobacco comment: Wellbutrin  Substance Use Topics  . Alcohol use: Yes    Comment: 1/5 of vodka per month     With staff assistance, above reviewed with the patient today.  ROS: As per HPI, otherwise no specific complaints on a limited and focused system review   No results found for this or any previous visit (from the  past 72 hour(s)).   PHQ2/9: Depression screen [facility] 2/9 06/22/2019 09/20/2018 09/14/2018 08/31/2018  Decreased Interest 0 0 0 1  Down, Depressed, Hopeless 0 0 0 0  PHQ - 2 Score 0 0 0 1  Altered sleeping 0 - 0 3  Tired, decreased energy 0 - 0 3  Change in appetite 0 - 0 2  Feeling bad or failure about yourself  0 - 0 0  Trouble concentrating 0 - 0 0  Moving slowly or fidgety/restless 0 - 0 0  Suicidal thoughts 0 - 0 0  PHQ-9 Score 0 - 0 9  Difficult doing work/chores Not difficult at all - Not difficult at all Somewhat difficult    Fall Risk: Fall Risk  06/22/2019 05/04/2019 12/22/2018 12/22/2018 10/09/2018  Falls in the past year? 1 0 0 0 0  Number falls in past yr: 0 - - 0 -  Injury with Fall? 1 - - 0 -  Follow up Falls evaluation completed - - - -      Objective:   Vitals:   05/12/20 0916  BP: 134/90  Pulse: 71  Resp: 16  Temp: 97.8 F (36.6 C)  TempSrc: Oral  SpO2: 98%  Weight: (!) 323 lb (146.5 kg)  Height: 5\' 9"  (1.753 m)    Body mass index is 47.7 kg/m.  Physical Exam   NAD, masked HEENT - [place]/AT, sclera anicteric, PERRL, conj - non-inj'ed, pharynx clear Neck - supple, no adenopathy, carotids 2+ and = without bruits bilat Car - RRR without m/g/r, not tachycardic Pulm- RR and effort normal at rest, CTA without wheeze or rales Abd - soft, morbidly obese, noted mild tenderness diffusely with palpation, including in the epigastric region, no rebound or guarding evident, BS+,   Back - no CVA tenderness Ext - no marked LE edema,  Neuro/psychiatric - affect was not flat, appropriate with conversation  Alert with normal speech  Results for orders placed or performed during the hospital encounter of 05/08/20  Surgical pathology  Result Value Ref Range   SURGICAL PATHOLOGY      SURGICAL PATHOLOGY CASE: [id number] PATIENT: [facility]ology Report     Specimen Submitted: A. Colon polyp x2, transverse; cs B. Colon polyp, cecum; cs  C. Colon polyp  x2, ascending; cs  Clinical History: Colon cancer screening Z12.11; GERD K21.9, Normal upper endoscopy, colon polyp      DIAGNOSIS: A. COLON POLYP X2, TRANSVERSE; COLD SNARE: - TUBULAR ADENOMA (MULTIPLE FRAGMENTS). - HYPERPLASTIC POLYP (1). - NEGATIVE FOR HIGH-GRADE DYSPLASIA AND MALIGNANCY.  B. - COLON POLYP, CECUM; COLD SNARE: - FECAL DEBRIS ONLY. - COLONIC MUCOSA IS NOT IDENTIFIED. - DEEPER SECTIONS EXAMINED.  C.  COLON POLYP X2, ASCENDING; COLD SNARE: - TUBULAR ADENOMA (3 FRAGMENTS). - NEGATIVE FOR HIGH-GRADE DYSPLASIA AND MALIGNANCY.  GROSS DESCRIPTION: A. Labeled: Cold snare polyp transverse colon x2 Received: Formalin Tissue fragment(s): Multiple Size: Aggregate, 1.4 x 0.7 x 0.3 cm Description: Tan-pink soft tissue fragments Entirely submitted in 1 cassett e.  B. Labeled: Cold snare polyp cecum Received: Formalin Tissue fragment(s): Multiple Size: Aggregate, 1.2 x 0.3 x 0.2 cm Description: Received are fragments of tan-green fecal matter.  A distinct portion of soft tissue is not grossly identified. Entirely submitted in 1 cassette.  C. Labeled: Cold snare polyp ascending colon x2 Received: Formalin Tissue fragment(s): 2 Size: Range from 0.4-0.7 cm Description: Tan-pink soft tissue fragments Entirely submitted in 1 cassette.    Final Diagnosis performed by Quay Burow, MD.   Electronically signed 05/09/2020 3:58:01PM The electronic signature indicates that the named Attending Pathologist has evaluated the specimen Technical component performed at [facility], [address] [place], [place] [address] Lab: [phone] Dir: Rush Farmer, MD, MMM  Professional component performed at [facility], [facility], [place], [place], [place] [address] Lab: [phone] Dir: Dellia Nims. Reuel Derby, MD        Assessment & Plan:    1. Gastroesophageal reflux disease without esophagitis Patient aware of his symptoms that are classic for reflux,  and have significantly worsened here in the recent past.  His scoping procedure with GI was unremarkable.  Denied any dark or black stools or bleeding per rectum, and no nausea or vomiting. Emphasized the importance of reflux precautions today, and information provided in the AVS as well to help.  Needs to stop caffeine, avoid spicy food and acidic foods in addition. Increase the omeprazole to twice daily, before breakfast and before dinner. Also recommended a Maalox or Mylanta liquid and acid product to help with symptoms as needed in the short-term.  2. Dyspnea, unspecified type He notes with his bad reflux symptoms, he sometimes feels more short of breath, and likely related.  3. Morbid obesity with BMI of 45.0-49.9, adult [facility]) Did note the obesity is often a contributor to reflux, and he noted he had seen [facility] weight clinic in the past and needed to get back to them.  Emphasized the importance today that if he develops any left-sided chest pain component, any pains up to the jaw or down the arm, or if more acute shortness of breath episodes occurring, marked diaphoresis he needs to go to the emergency room setting to be seen immediately to ensure there is not a cardiac source.  Did feel the symptoms were likely reflux, as did he, although the importance of ruling out a cardiac source is needed should any of these more concerning symptoms develop, and he was understanding of that.  Also, if symptoms not improving or more problematic despite the above, needs to follow-up again.  His blood pressure was borderline high, possibly the increased symptoms recently contributing.    Towanda Malkin, MD 05/12/20 10:08 AM

## 2020-05-09 NOTE — Telephone Encounter (Signed)
Pt. Reports x 2 months he has had burning in his "esophagus and throat. It makes me feel short of breath when it happens." States he had "a scope yesterday and it was all clear." "I feel like I have reflux going on." Request an appointment Monday. Appointment made.   Reason for Disposition . [1] MODERATE longstanding difficulty breathing (e.g., speaks in phrases, SOB even at rest, pulse 100-120) AND [2] SAME as normal  Answer Assessment - Initial Assessment Questions 1. RESPIRATORY STATUS: "Describe your breathing?" (e.g., wheezing, shortness of breath, unable to speak, severe coughing)      Shortness of breath 2. ONSET: "When did this breathing problem begin?"      2 months ago 3. PATTERN "Does the difficult breathing come and go, or has it been constant since it started?"      Comes and goes 4. SEVERITY: "How bad is your breathing?" (e.g., mild, moderate, severe)    - MILD: No SOB at rest, mild SOB with walking, speaks normally in sentences, can lay down, no retractions, pulse < 100.    - MODERATE: SOB at rest, SOB with minimal exertion and prefers to sit, cannot lie down flat, speaks in phrases, mild retractions, audible wheezing, pulse 100-120.    - SEVERE: Very SOB at rest, speaks in single words, struggling to breathe, sitting hunched forward, retractions, pulse > 120      Mild 5. RECURRENT SYMPTOM: "Have you had difficulty breathing before?" If Yes, ask: "When was the last time?" and "What happened that time?"      Yes 6. CARDIAC HISTORY: "Do you have any history of heart disease?" (e.g., heart attack, angina, bypass surgery, angioplasty)      No 7. LUNG HISTORY: "Do you have any history of lung disease?"  (e.g., pulmonary embolus, asthma, emphysema)     No 8. CAUSE: "What do you think is causing the breathing problem?"      Reflux 9. OTHER SYMPTOMS: "Do you have any other symptoms? (e.g., dizziness, runny nose, cough, chest pain, fever)     Reflux 10. PREGNANCY: "Is there any  chance you are pregnant?" "When was your last menstrual period?"       n/a 11. TRAVEL: "Have you traveled out of the country in the last month?" (e.g., travel history, exposures)       No  Protocols used: BREATHING DIFFICULTY-A-AH

## 2020-05-09 NOTE — ED Notes (Addendum)
Pt been called for 4th time for triage without answer. Per registration, pt not visualized in waiting room. Checked in male restroom, no pt in restroom.

## 2020-05-11 ENCOUNTER — Encounter: Payer: Self-pay | Admitting: Gastroenterology

## 2020-05-12 ENCOUNTER — Other Ambulatory Visit: Payer: Self-pay

## 2020-05-12 ENCOUNTER — Ambulatory Visit: Payer: Medicaid Other | Admitting: Internal Medicine

## 2020-05-12 ENCOUNTER — Telehealth: Payer: Self-pay | Admitting: Gastroenterology

## 2020-05-12 VITALS — BP 134/90 | HR 71 | Temp 97.8°F | Resp 16 | Ht 69.0 in | Wt 323.0 lb

## 2020-05-12 DIAGNOSIS — K219 Gastro-esophageal reflux disease without esophagitis: Secondary | ICD-10-CM

## 2020-05-12 DIAGNOSIS — R0600 Dyspnea, unspecified: Secondary | ICD-10-CM

## 2020-05-12 DIAGNOSIS — Z6841 Body Mass Index (BMI) 40.0 and over, adult: Secondary | ICD-10-CM | POA: Diagnosis not present

## 2020-05-12 MED ORDER — ESOMEPRAZOLE MAGNESIUM 40 MG PO CPDR: 40.0000 mg | DELAYED_RELEASE_CAPSULE | Freq: Two times a day (BID) | ORAL | 1 refills | Status: AC

## 2020-05-12 NOTE — Telephone Encounter (Signed)
Patient states even with taking the medication nexium, he is still experiencing acid reflux. Pt wants to know if he needs a stronger dose or something else. Please advise.

## 2020-05-12 NOTE — Patient Instructions (Signed)
Increase the omeprazole product to twice daily before breakfast and before dinner.  Take a liquid Maalox or Mylanta product as needed for reflux symptoms  Reflux precautions are important presently as well as we discussed.  If you develop any left-sided chest pains, any pains to the jaw or to the arm, any marked increase shortness of breath or marked sweatiness in association, you need to get to the emergency room to be seen immediately.    Gastroesophageal Reflux Disease, Adult Gastroesophageal reflux (GER) happens when acid from the stomach flows up into the tube that connects the mouth and the stomach (esophagus). Normally, food travels down the esophagus and stays in the stomach to be digested. With GER, food and stomach acid sometimes move back up into the esophagus. You may have a disease called gastroesophageal reflux disease (GERD) if the reflux:  Happens often.  Causes frequent or very bad symptoms.  Causes problems such as damage to the esophagus. When this happens, the esophagus becomes sore and swollen (inflamed). Over time, GERD can make small holes (ulcers) in the lining of the esophagus. What are the causes? This condition is caused by a problem with the muscle between the esophagus and the stomach. When this muscle is weak or not normal, it does not close properly to keep food and acid from coming back up from the stomach. The muscle can be weak because of:  Tobacco use.  Pregnancy.  Having a certain type of hernia (hiatal hernia).  Alcohol use.  Certain foods and drinks, such as coffee, chocolate, onions, and peppermint. What increases the risk? You are more likely to develop this condition if you:  Are overweight.  Have a disease that affects your connective tissue.  Use NSAID medicines. What are the signs or symptoms? Symptoms of this condition include:  Heartburn.  Difficult or painful swallowing.  The feeling of having a lump in the throat.  A  bitter taste in the mouth.  Bad breath.  Having a lot of saliva.  Having an upset or bloated stomach.  Belching.  Chest pain. Different conditions can cause chest pain. Make sure you see your doctor if you have chest pain.  Shortness of breath or noisy breathing (wheezing).  Ongoing (chronic) cough or a cough at night.  Wearing away of the surface of teeth (tooth enamel).  Weight loss. How is this treated? Treatment will depend on how bad your symptoms are. Your doctor may suggest:  Changes to your diet.  Medicine.  Surgery. Follow these instructions at home: Eating and drinking   Follow a diet as told by your doctor. You may need to avoid foods and drinks such as: ? Coffee and tea (with or without caffeine). ? Drinks that contain alcohol. ? Energy drinks and sports drinks. ? Bubbly (carbonated) drinks or sodas. ? Chocolate and cocoa. ? Peppermint and mint flavorings. ? Garlic and onions. ? Horseradish. ? Spicy and acidic foods. These include peppers, chili powder, curry powder, vinegar, hot sauces, and BBQ sauce. ? Citrus fruit juices and citrus fruits, such as oranges, lemons, and limes. ? Tomato-based foods. These include red sauce, chili, salsa, and pizza with red sauce. ? Fried and fatty foods. These include donuts, french fries, potato chips, and high-fat dressings. ? High-fat meats. These include hot dogs, rib eye steak, sausage, ham, and bacon. ? High-fat dairy items, such as whole milk, butter, and cream cheese.  Eat small meals often. Avoid eating large meals.  Avoid drinking large amounts of liquid with  your meals.  Avoid eating meals during the 2-3 hours before bedtime.  Avoid lying down right after you eat.  Do not exercise right after you eat. Lifestyle   Do not use any products that contain nicotine or tobacco. These include cigarettes, e-cigarettes, and chewing tobacco. If you need help quitting, ask your doctor.  Try to lower your stress.  If you need help doing this, ask your doctor.  If you are overweight, lose an amount of weight that is healthy for you. Ask your doctor about a safe weight loss goal. General instructions  Pay attention to any changes in your symptoms.  Take over-the-counter and prescription medicines only as told by your doctor. Do not take aspirin, ibuprofen, or other NSAIDs unless your doctor says it is okay.  Wear loose clothes. Do not wear anything tight around your waist.  Raise (elevate) the head of your bed about 6 inches (15 cm).  Avoid bending over if this makes your symptoms worse.  Keep all follow-up visits as told by your doctor. This is important. Contact a doctor if:  You have new symptoms.  You lose weight and you do not know why.  You have trouble swallowing or it hurts to swallow.  You have wheezing or a cough that keeps happening.  Your symptoms do not get better with treatment.  You have a hoarse voice. Get help right away if:  You have pain in your arms, neck, jaw, teeth, or back.  You feel sweaty, dizzy, or light-headed.  You have chest pain or shortness of breath.  You throw up (vomit) and your throw-up looks like blood or coffee grounds.  You pass out (faint).  Your poop (stool) is bloody or black.  You cannot swallow, drink, or eat. Summary  If a person has gastroesophageal reflux disease (GERD), food and stomach acid move back up into the esophagus and cause symptoms or problems such as damage to the esophagus.  Treatment will depend on how bad your symptoms are.  Follow a diet as told by your doctor.  Take all medicines only as told by your doctor. This information is not intended to replace advice given to you by your health care provider. Make sure you discuss any questions you have with your health care provider. Document Revised: 11/16/2017 Document Reviewed: 11/16/2017 Elsevier Patient Education  [employer].

## 2020-05-12 NOTE — Telephone Encounter (Signed)
Called pt again and advised pt Dr. Allen Norris is out of the office and will not have any availability until January. Pt will try recommendations of his PCP and will call me back if no improvement in his symptoms.

## 2020-05-12 NOTE — Telephone Encounter (Signed)
Tried returning pt's call but voicemail was not set up. Unable to leave message.

## 2020-05-13 ENCOUNTER — Telehealth: Payer: Self-pay

## 2020-05-13 NOTE — Telephone Encounter (Signed)
Copied from [place] [id number]. Topic: General - Other >> May 13, 2020  8:17 AM Leward Quan A wrote: Reason for CRM: Patient called in to inform Dr Roxan Hockey that the increase in the  esomeprazole ([place]) 40 MG capsule has not worked say he took 3 yesterday and woke with indigestion this morning and havent even eaten yet. Asking for something else please. Can be reached at  Ph# [phone]

## 2020-05-13 NOTE — Telephone Encounter (Signed)
Please ask him to contact the GI person he saw who did the scoping procedures last week to get their recommendation. I tried a higher strength PPI and need their help with any next step to help better manage. Also, as I emphasized with him yesterday, if he is having any associated symptoms like increased SOB, marked sweatiness, pain up to the jaw or down the arms, he needs to be seen in the ER, and also if the pain is getting more severe and not relieved with the higher strength PPI medicine.  Thanks,

## 2020-05-14 ENCOUNTER — Telehealth: Payer: Self-pay | Admitting: Internal Medicine

## 2020-05-14 ENCOUNTER — Other Ambulatory Visit: Payer: Self-pay | Admitting: Internal Medicine

## 2020-05-14 DIAGNOSIS — K219 Gastro-esophageal reflux disease without esophagitis: Secondary | ICD-10-CM

## 2020-05-14 NOTE — Telephone Encounter (Signed)
Patient called to ask for a referral to a good cardiologist.  Please call patient to confirm at [phone]

## 2020-05-14 NOTE — Telephone Encounter (Signed)
Medication Refill - Medication: esomeprazole (NEXIUM) 40 MG capsule     Preferred Pharmacy (with phone number or street name):  [id number] - [place], [place] Phone:  [phone]  Fax:  [phone]       Agent: Please be advised that RX refills may take up to 3 business days. We ask that you follow-up with your pharmacy.

## 2020-05-15 ENCOUNTER — Telehealth: Payer: Self-pay | Admitting: Gastroenterology

## 2020-05-15 NOTE — Telephone Encounter (Signed)
Patient has reached out to GI and is waiting on response.

## 2020-05-15 NOTE — Telephone Encounter (Signed)
Pt having acid reflux and watery diarrhea since his procedure, please call to advise

## 2020-05-19 ENCOUNTER — Other Ambulatory Visit: Payer: Self-pay

## 2020-05-19 NOTE — Telephone Encounter (Signed)
Per pt girlfriend's Orange passed away on Christmas Day.

## 2020-05-20 ENCOUNTER — Ambulatory Visit: Payer: Medicaid Other | Admitting: Gastroenterology

## 2020-05-24 DEATH — deceased

## 2020-09-02 ENCOUNTER — Other Ambulatory Visit: Payer: Self-pay | Admitting: Family Medicine

## 2020-09-02 DIAGNOSIS — R9389 Abnormal findings on diagnostic imaging of other specified body structures: Secondary | ICD-10-CM

## 2020-12-11 IMAGING — CR DG CHEST 1V
1 series · 1 of 1 positions shown · non-contrast
Comparison: None.

CLINICAL DATA: Preop.  Fever

EXAM:
CHEST  1 VIEW

[x chest ap]
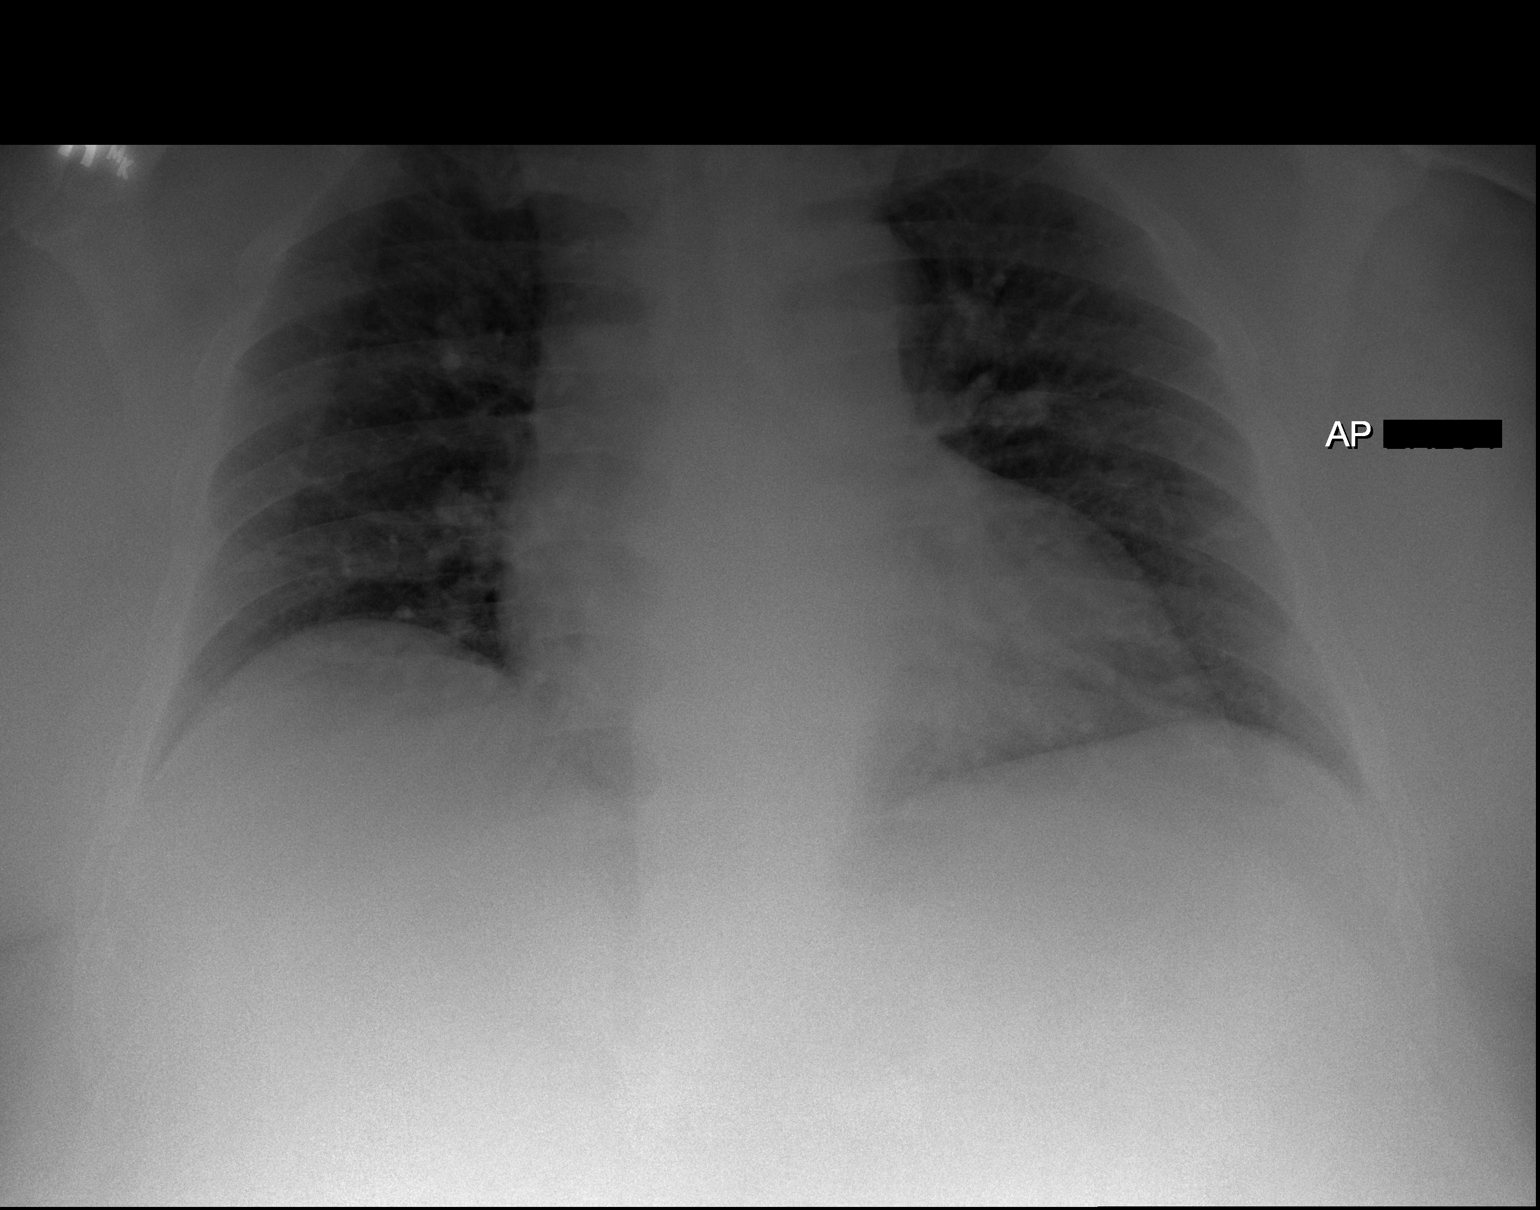

[1 of 1 positions shown; findings below may reference images not displayed]

FINDINGS: Mild cardiomegaly. No confluent airspace opacities, effusions or
edema. No acute bony abnormality.
IMPRESSION: Mild cardiomegaly.  No active disease.

## 2020-12-11 IMAGING — CT CT FEMUR *L* W/ CM
3 of 4 series · 12 of 33 positions shown, 14 images · IV contrast (agent unspecified)
Comparison: None.

CONTRAST:  125mL OMNIPAQUE IOHEXOL 300 MG/ML  SOLN

CLINICAL DATA: Left thigh abscess.

EXAM:
CT OF THE LOWER RIGHT EXTREMITY WITH CONTRAST
TECHNIQUE: Multidetector CT imaging of the lower right extremity was performed
according to the standard protocol following intravenous contrast
administration.

[Series 9: axial st · axial · 0.70mm/px · z∈[-591,-215]mm · 4 of 395 slices shown, 5 images]
[im 72/395  soft-tissue]
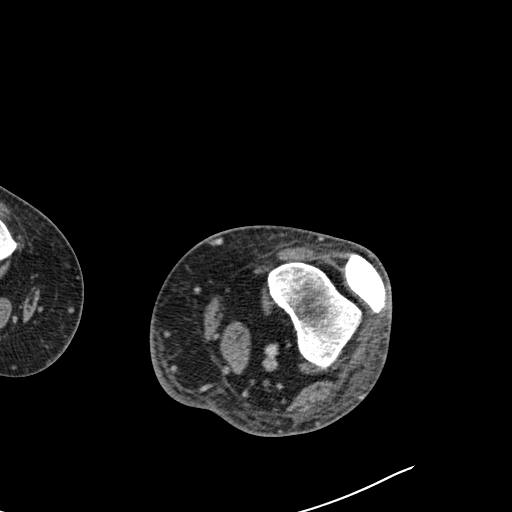
[im 72/395  bone]
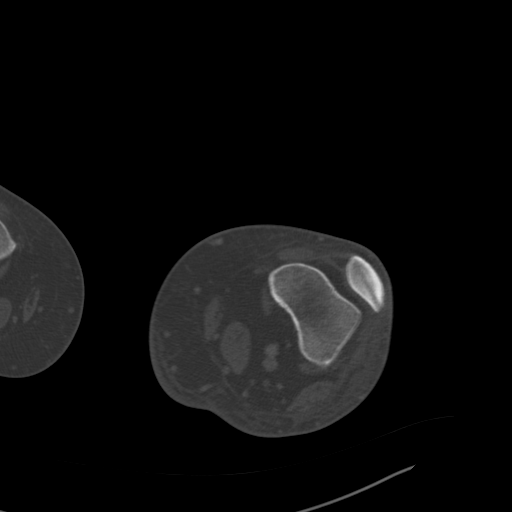
[im 144/395  bone]
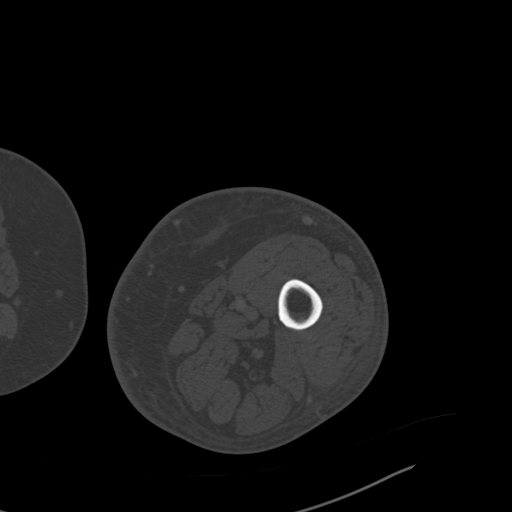
[im 251/395  bone]
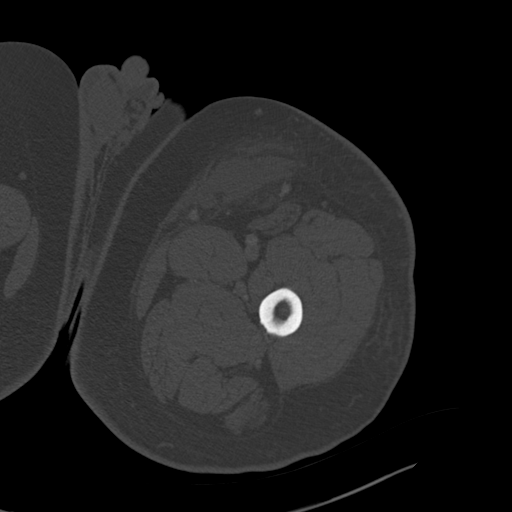
[im 323/395  bone]
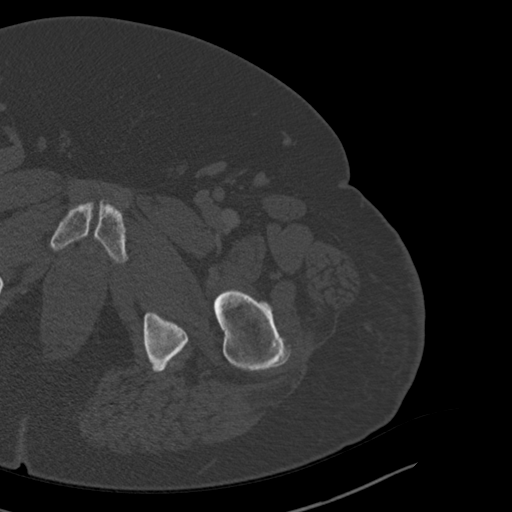

[Series 10: coronal st · coronal · 0.65mm/px · 3 of 249 slices shown]
[im 50/249  bone]
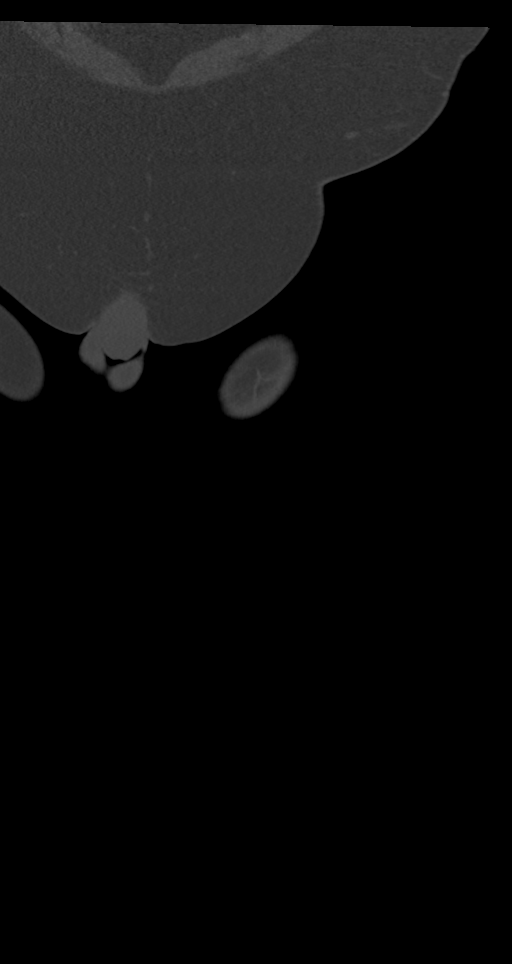
[im 100/249  bone]
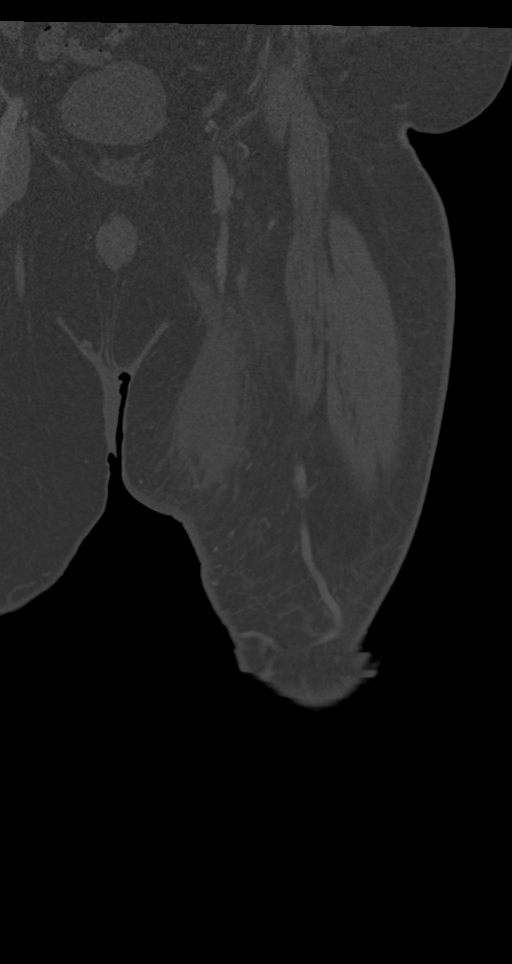
[im 149/249  bone]
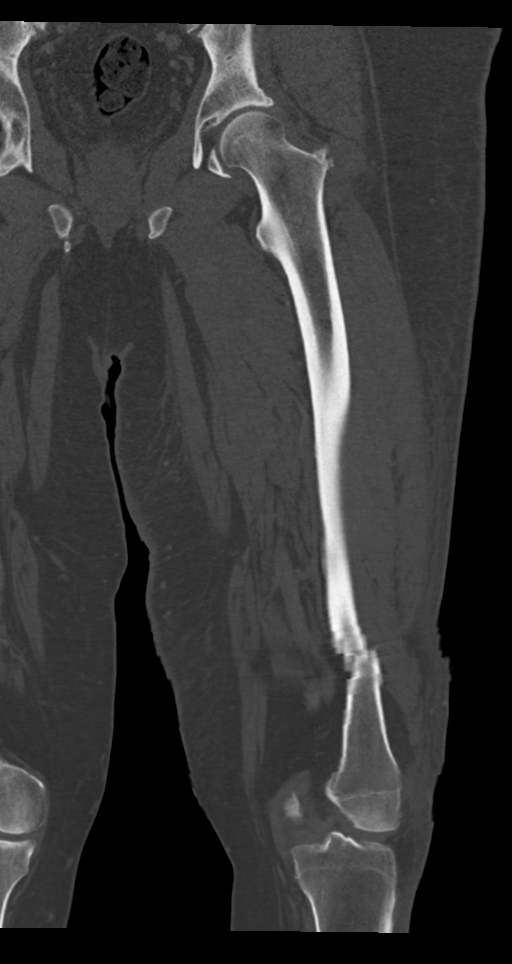

[Series 11: sagittal st · sagittal · 0.79mm/px · 5 of 181 slices shown, 6 images]
[im 61/181  bone]
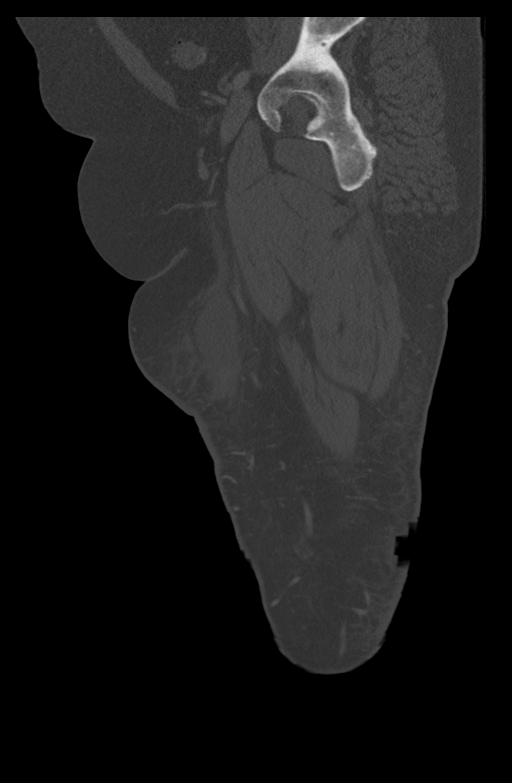
[im 76/181  bone]
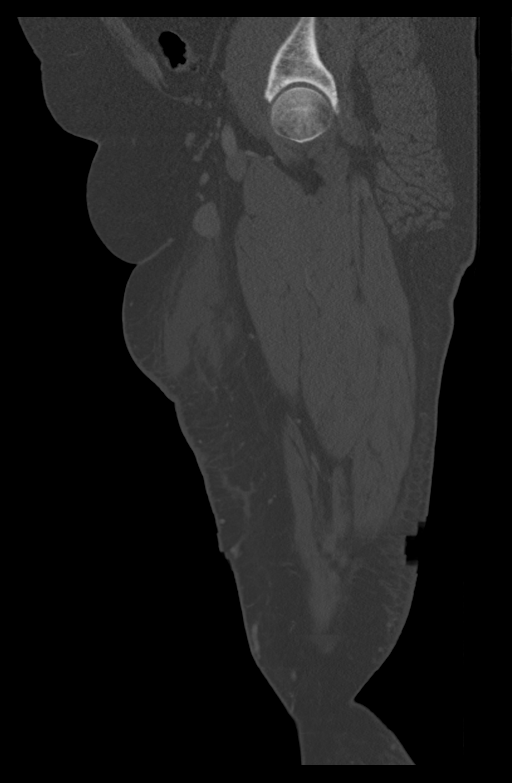
[im 91/181  soft-tissue]
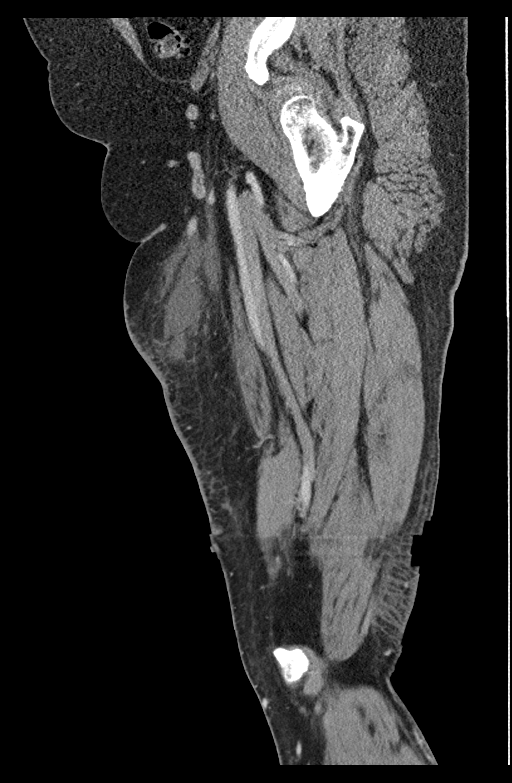
[im 91/181  bone]
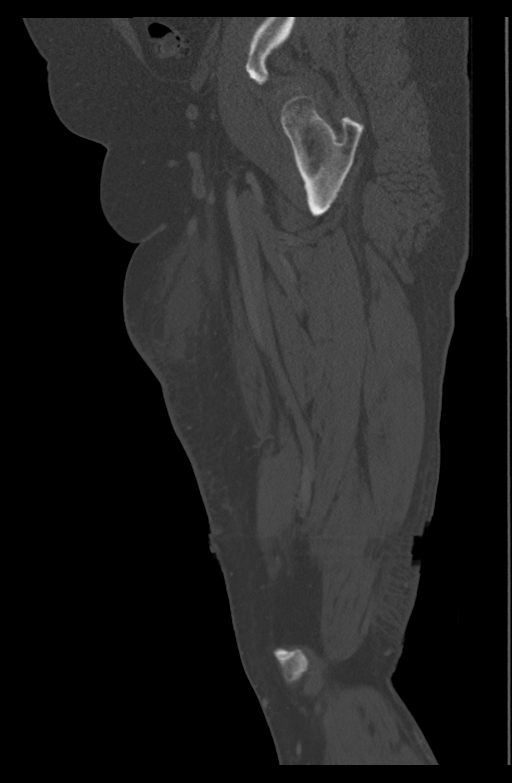
[im 106/181  bone]
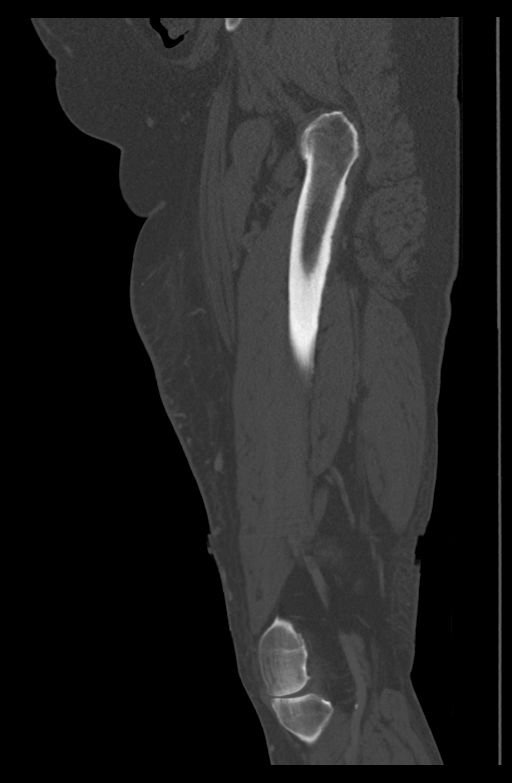
[im 121/181  bone]
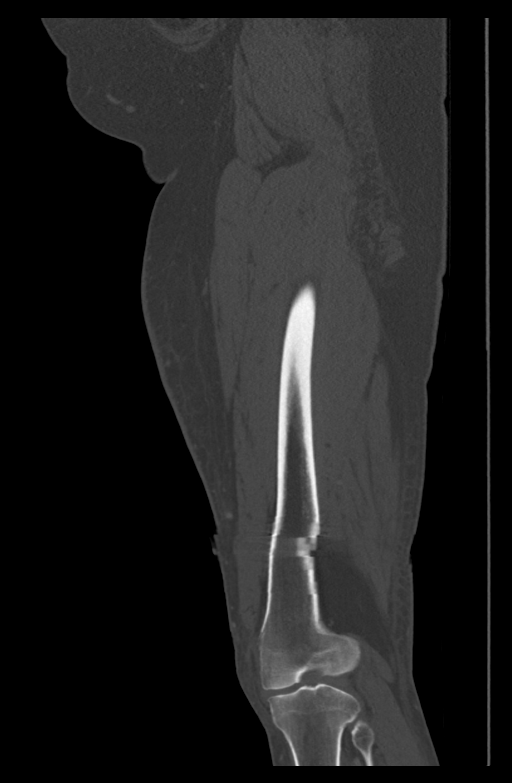

[12 of 33 positions shown; findings below may reference images not displayed]

FINDINGS: No fracture or other acute bony abnormality is noted. No definite
abnormality seen in the visualized portion of the pelvis. Mildly
enlarged left inguinal lymph nodes are noted which most likely are
inflammatory in etiology. Stranding of the subcutaneous tissues of
the medial and anterior aspect of the proximal left thigh is noted
most consistent with cellulitis. There are multiple irregular fluid
collections seen in this area which potentially could represent
possible developing abscess or abscesses.
IMPRESSION: Findings consistent with cellulitis involving the subcutaneous
tissues involving the medial and anterior aspect of the proximal
left thigh. Multiple irregular low densities are noted within this
area concerning for small fluid collections with ill-defined
margins; potentially these may represent multiple developing
abscesses. However, no definite wall enhancement is noted at this
time.

## 2021-02-06 ENCOUNTER — Ambulatory Visit: Payer: Self-pay | Admitting: Family Medicine
